# Patient Record
Sex: Female | Born: 1960 | Race: White | Hispanic: No | Smoking: Never smoker
Health system: Southern US, Community
[De-identification: ages and names within clinical notes are randomized; demographics above are authoritative.]

## PROBLEM LIST (undated history)

## (undated) DIAGNOSIS — Z780 Asymptomatic menopausal state: Secondary | ICD-10-CM

## (undated) DIAGNOSIS — R7303 Prediabetes: Secondary | ICD-10-CM

## (undated) DIAGNOSIS — E785 Hyperlipidemia, unspecified: Secondary | ICD-10-CM

## (undated) DIAGNOSIS — M722 Plantar fascial fibromatosis: Secondary | ICD-10-CM

## (undated) DIAGNOSIS — E039 Hypothyroidism, unspecified: Secondary | ICD-10-CM

## (undated) DIAGNOSIS — G8929 Other chronic pain: Secondary | ICD-10-CM

## (undated) DIAGNOSIS — F419 Anxiety disorder, unspecified: Secondary | ICD-10-CM

## (undated) DIAGNOSIS — E282 Polycystic ovarian syndrome: Secondary | ICD-10-CM

## (undated) DIAGNOSIS — M1990 Unspecified osteoarthritis, unspecified site: Secondary | ICD-10-CM

## (undated) DIAGNOSIS — N841 Polyp of cervix uteri: Secondary | ICD-10-CM

## (undated) DIAGNOSIS — F32A Depression, unspecified: Secondary | ICD-10-CM

## (undated) DIAGNOSIS — N809 Endometriosis, unspecified: Secondary | ICD-10-CM

## (undated) DIAGNOSIS — F329 Major depressive disorder, single episode, unspecified: Secondary | ICD-10-CM

## (undated) DIAGNOSIS — M549 Dorsalgia, unspecified: Secondary | ICD-10-CM

## (undated) DIAGNOSIS — J90 Pleural effusion, not elsewhere classified: Secondary | ICD-10-CM

## (undated) DIAGNOSIS — L709 Acne, unspecified: Secondary | ICD-10-CM

## (undated) DIAGNOSIS — K219 Gastro-esophageal reflux disease without esophagitis: Secondary | ICD-10-CM

## (undated) HISTORY — DX: Other chronic pain: G89.29

## (undated) HISTORY — DX: Gastro-esophageal reflux disease without esophagitis: K21.9

## (undated) HISTORY — PX: TONSILLECTOMY: SUR1361

## (undated) HISTORY — DX: Anxiety disorder, unspecified: F41.9

## (undated) HISTORY — PX: BREAST EXCISIONAL BIOPSY: SUR124

## (undated) HISTORY — DX: Hyperlipidemia, unspecified: E78.5

## (undated) HISTORY — DX: Major depressive disorder, single episode, unspecified: F32.9

## (undated) HISTORY — DX: Depression, unspecified: F32.A

## (undated) HISTORY — DX: Acne, unspecified: L70.9

## (undated) HISTORY — DX: Plantar fascial fibromatosis: M72.2

## (undated) HISTORY — PX: BREAST LUMPECTOMY: SHX2

## (undated) HISTORY — DX: Asymptomatic menopausal state: Z78.0

## (undated) HISTORY — PX: BREAST MASS EXCISION: SHX1267

## (undated) HISTORY — PX: DIAGNOSTIC LAPAROSCOPY: SUR761

## (undated) HISTORY — DX: Polycystic ovarian syndrome: E28.2

## (undated) HISTORY — DX: Endometriosis, unspecified: N80.9

## (undated) HISTORY — DX: Polyp of cervix uteri: N84.1

## (undated) HISTORY — DX: Hypothyroidism, unspecified: E03.9

## (undated) HISTORY — DX: Unspecified osteoarthritis, unspecified site: M19.90

## (undated) HISTORY — DX: Pleural effusion, not elsewhere classified: J90

## (undated) HISTORY — DX: Prediabetes: R73.03

## (undated) HISTORY — DX: Dorsalgia, unspecified: M54.9

---
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
## 1982-01-26 HISTORY — PX: OTHER SURGICAL HISTORY: SHX169

## 1997-11-19 ENCOUNTER — Ambulatory Visit (HOSPITAL_COMMUNITY): Admission: RE | Admit: 1997-11-19 | Discharge: 1997-11-19 | Payer: Self-pay | Admitting: *Deleted

## 2000-10-14 ENCOUNTER — Encounter: Admission: RE | Admit: 2000-10-14 | Discharge: 2000-10-14 | Payer: Self-pay | Admitting: Family Medicine

## 2001-10-26 ENCOUNTER — Encounter: Admission: RE | Admit: 2001-10-26 | Discharge: 2001-10-26 | Payer: Self-pay | Admitting: Family Medicine

## 2001-10-26 ENCOUNTER — Encounter: Payer: Self-pay | Admitting: Family Medicine

## 2001-11-02 ENCOUNTER — Other Ambulatory Visit: Admission: RE | Admit: 2001-11-02 | Discharge: 2001-11-02 | Payer: Self-pay | Admitting: Family Medicine

## 2001-11-21 ENCOUNTER — Encounter: Admission: RE | Admit: 2001-11-21 | Discharge: 2002-02-19 | Payer: Self-pay | Admitting: *Deleted

## 2002-03-23 ENCOUNTER — Encounter: Admission: RE | Admit: 2002-03-23 | Discharge: 2002-06-21 | Payer: Self-pay

## 2002-11-08 ENCOUNTER — Encounter: Admission: RE | Admit: 2002-11-08 | Discharge: 2002-11-08 | Payer: Self-pay

## 2003-11-15 ENCOUNTER — Encounter: Admission: RE | Admit: 2003-11-15 | Discharge: 2003-11-15 | Payer: Self-pay | Admitting: Family Medicine

## 2004-01-02 ENCOUNTER — Other Ambulatory Visit: Admission: RE | Admit: 2004-01-02 | Discharge: 2004-01-02 | Payer: Self-pay | Admitting: Family Medicine

## 2004-11-20 ENCOUNTER — Encounter: Admission: RE | Admit: 2004-11-20 | Discharge: 2004-11-20 | Payer: Self-pay | Admitting: Family Medicine

## 2005-01-16 ENCOUNTER — Other Ambulatory Visit: Admission: RE | Admit: 2005-01-16 | Discharge: 2005-01-16 | Payer: Self-pay | Admitting: Family Medicine

## 2005-12-10 ENCOUNTER — Encounter: Admission: RE | Admit: 2005-12-10 | Discharge: 2005-12-10 | Payer: Self-pay | Admitting: Internal Medicine

## 2006-02-24 ENCOUNTER — Other Ambulatory Visit: Admission: RE | Admit: 2006-02-24 | Discharge: 2006-02-24 | Payer: Self-pay | Admitting: Internal Medicine

## 2006-07-22 ENCOUNTER — Ambulatory Visit (HOSPITAL_COMMUNITY): Admission: RE | Admit: 2006-07-22 | Discharge: 2006-07-22 | Payer: Self-pay | Admitting: Obstetrics and Gynecology

## 2006-07-22 ENCOUNTER — Encounter (INDEPENDENT_AMBULATORY_CARE_PROVIDER_SITE_OTHER): Payer: Self-pay | Admitting: Obstetrics and Gynecology

## 2007-01-05 ENCOUNTER — Encounter: Admission: RE | Admit: 2007-01-05 | Discharge: 2007-01-05 | Payer: Self-pay | Admitting: Internal Medicine

## 2007-03-27 LAB — HM DEXA SCAN: HM Dexa Scan: NORMAL

## 2007-04-20 ENCOUNTER — Ambulatory Visit (HOSPITAL_COMMUNITY): Admission: RE | Admit: 2007-04-20 | Discharge: 2007-04-20 | Payer: Self-pay | Admitting: Internal Medicine

## 2008-01-16 ENCOUNTER — Encounter: Admission: RE | Admit: 2008-01-16 | Discharge: 2008-01-16 | Payer: Self-pay | Admitting: Internal Medicine

## 2008-01-27 HISTORY — PX: SYNOVIAL CYST EXCISION: SUR507

## 2008-01-30 ENCOUNTER — Ambulatory Visit (HOSPITAL_COMMUNITY): Admission: RE | Admit: 2008-01-30 | Discharge: 2008-01-30 | Payer: Self-pay | Admitting: Internal Medicine

## 2008-02-15 ENCOUNTER — Ambulatory Visit (HOSPITAL_COMMUNITY): Admission: RE | Admit: 2008-02-15 | Discharge: 2008-02-15 | Payer: Self-pay | Admitting: Orthopedic Surgery

## 2008-02-24 ENCOUNTER — Ambulatory Visit (HOSPITAL_COMMUNITY): Admission: RE | Admit: 2008-02-24 | Discharge: 2008-02-24 | Payer: Self-pay | Admitting: Orthopedic Surgery

## 2008-03-15 ENCOUNTER — Ambulatory Visit (HOSPITAL_COMMUNITY): Admission: RE | Admit: 2008-03-15 | Discharge: 2008-03-15 | Payer: Self-pay | Admitting: Orthopedic Surgery

## 2008-04-12 ENCOUNTER — Ambulatory Visit (HOSPITAL_COMMUNITY): Admission: RE | Admit: 2008-04-12 | Discharge: 2008-04-12 | Payer: Self-pay | Admitting: Orthopedic Surgery

## 2008-06-03 ENCOUNTER — Encounter: Admission: RE | Admit: 2008-06-03 | Discharge: 2008-06-03 | Payer: Self-pay | Admitting: Orthopedic Surgery

## 2008-08-28 ENCOUNTER — Ambulatory Visit (HOSPITAL_COMMUNITY): Admission: RE | Admit: 2008-08-28 | Discharge: 2008-08-29 | Payer: Self-pay | Admitting: Neurosurgery

## 2008-08-29 ENCOUNTER — Encounter (INDEPENDENT_AMBULATORY_CARE_PROVIDER_SITE_OTHER): Payer: Self-pay | Admitting: Neurosurgery

## 2009-02-06 ENCOUNTER — Encounter: Admission: RE | Admit: 2009-02-06 | Discharge: 2009-02-06 | Payer: Self-pay | Admitting: Internal Medicine

## 2010-01-26 DIAGNOSIS — Z860100 Personal history of colon polyps, unspecified: Secondary | ICD-10-CM

## 2010-01-26 HISTORY — DX: Personal history of colon polyps, unspecified: Z86.0100

## 2010-01-26 HISTORY — DX: Personal history of colonic polyps: Z86.010

## 2010-02-16 ENCOUNTER — Encounter: Payer: Self-pay | Admitting: Family Medicine

## 2010-03-05 ENCOUNTER — Other Ambulatory Visit: Payer: Self-pay | Admitting: Internal Medicine

## 2010-03-05 DIAGNOSIS — Z1231 Encounter for screening mammogram for malignant neoplasm of breast: Secondary | ICD-10-CM

## 2010-03-26 ENCOUNTER — Ambulatory Visit
Admission: RE | Admit: 2010-03-26 | Discharge: 2010-03-26 | Disposition: A | Discharge status: Home / Self Care | Payer: Commercial Managed Care - PPO | Source: Ambulatory Visit | Attending: Internal Medicine | Admitting: Internal Medicine

## 2010-03-26 DIAGNOSIS — Z1231 Encounter for screening mammogram for malignant neoplasm of breast: Secondary | ICD-10-CM

## 2010-05-04 LAB — COMPREHENSIVE METABOLIC PANEL
ALT: 34 U/L (ref 0–35)
Albumin: 4.5 g/dL (ref 3.5–5.2)
BUN: 6 mg/dL (ref 6–23)
CO2: 30 mEq/L (ref 19–32)
Chloride: 102 mEq/L (ref 96–112)
Creatinine, Ser: 0.81 mg/dL (ref 0.4–1.2)
GFR calc Af Amer: >60 mL/min (ref >60)
GFR calc non Af Amer: >60 mL/min (ref >60)
Glucose, Bld: 94 mg/dL (ref 70–99)
Potassium: 4.2 mEq/L (ref 3.5–5.1)
Sodium: 137 mEq/L (ref 135–145)
Total Protein: 6.9 g/dL (ref 6.0–8.3)

## 2010-05-04 LAB — DIFFERENTIAL
Lymphs Abs: 1.4 10*3/uL (ref 0.7–4.0)
Neutrophils Relative %: 57 % (ref 43–77)

## 2010-05-04 LAB — URINALYSIS, ROUTINE W REFLEX MICROSCOPIC
Glucose, UA: NEGATIVE mg/dL
Ketones, ur: NEGATIVE mg/dL
Specific Gravity, Urine: 1.004 — ABNORMAL LOW (ref 1.005–1.030)
pH: 6 (ref 5.0–8.0)

## 2010-05-04 LAB — PROTIME-INR: Prothrombin Time: 12.8 seconds (ref 11.6–15.2)

## 2010-05-04 LAB — APTT: aPTT: 25 seconds (ref 24–37)

## 2010-05-04 LAB — CBC
Platelets: 329 10*3/uL (ref 150–400)
RBC: 4.17 MIL/uL (ref 3.87–5.11)

## 2010-05-08 LAB — CBC
HCT: 36.8 % (ref 36.0–46.0)
Hemoglobin: 13 g/dL (ref 12.0–15.0)
MCHC: 35.2 g/dL (ref 30.0–36.0)
MCV: 92.5 fL (ref 78.0–100.0)
Platelets: 298 10*3/uL (ref 150–400)
RDW: 13.5 % (ref 11.5–15.5)

## 2010-05-08 LAB — PROTIME-INR
INR: 0.9 (ref 0.00–1.49)
Prothrombin Time: 12.4 seconds (ref 11.6–15.2)

## 2010-05-13 LAB — CBC
HCT: 35.5 % — ABNORMAL LOW (ref 36.0–46.0)
MCHC: 35.2 g/dL (ref 30.0–36.0)
MCV: 92.9 fL (ref 78.0–100.0)
Platelets: 266 10*3/uL (ref 150–400)
RBC: 3.82 MIL/uL — ABNORMAL LOW (ref 3.87–5.11)
RDW: 13.1 % (ref 11.5–15.5)
WBC: 4.3 10*3/uL (ref 4.0–10.5)

## 2010-05-13 LAB — WOUND CULTURE: Culture: NO GROWTH

## 2010-05-13 LAB — BASIC METABOLIC PANEL
Calcium: 9.3 mg/dL (ref 8.4–10.5)
GFR calc Af Amer: >60 mL/min (ref >60)

## 2010-05-13 LAB — PROTIME-INR
INR: 1 (ref 0.00–1.49)
Prothrombin Time: 12.9 seconds (ref 11.6–15.2)

## 2010-05-22 ENCOUNTER — Ambulatory Visit (INDEPENDENT_AMBULATORY_CARE_PROVIDER_SITE_OTHER): Payer: 59 | Admitting: Psychology

## 2010-05-22 DIAGNOSIS — F4322 Adjustment disorder with anxiety: Secondary | ICD-10-CM

## 2010-06-06 ENCOUNTER — Other Ambulatory Visit: Payer: Commercial Managed Care - PPO | Admitting: Gastroenterology

## 2010-06-10 NOTE — H&P (Signed)
NAMEGUSTAVO, MEDITZ          ACCOUNT NO.:  [id number]   MEDICAL RECORD NO.:  [id number]          PATIENT TYPE:  OIB   LOCATION:  3023                         FACILITY:  [facility]   PHYSICIAN:  Payton Doughty, M.D.      DATE OF BIRTH:  [birth year]   DATE OF ADMISSION:  08/28/2008  DATE OF DISCHARGE:                              HISTORY & PHYSICAL   ADMISSION DIAGNOSES:  Synovial cyst on the left side with left L5  radiculopathy.   This is a 50 year old right-handed white female who is having pain in  her back down her left leg for sometime, worse in December.  MR shows  synovial cyst was aspirated twice, and she had pain in her left leg.  She came to me with an MR from May 2010, it shows  she still has the  synovial cyst.   Medical history is remarkable for polycystic ovarian disease.   She is on Xanax, calcium, Keflex, Coenzyme Q, metformin, Vytorin,  TriCor, Prozac, Celebrex, and Synthroid.   She is allergic to ADVICOR.   SURGICAL HISTORY:  She had meningioma resected, tonsillectomy, and  laparoscopy for endometriosis.   SOCIAL HISTORY:  She does not smoke, is a social drinker.  She is a  Engineer, civil (consulting), works at [employer].   FAMILY HISTORY:  Mother is 26 and in good health with arthritis and  dementia.  Father deceased at 39, he had a stroke, heart attack, and  Parkinson disease.   REVIEW OF SYSTEMS:  Remarkable for wearing glasses,  hypercholesterolemia, leg pain, endometriosis, leg weakness, arthritis,  anxiety and depression, thyroid disease.   PHYSICAL EXAMINATION:  HEENT:  Normal limits.  NECK:  She has good range of motion of the neck.  CHEST:  Clear.  CARDIAC:  Regular rate and rhythm.  ABDOMEN:  Nontender with no hepatosplenomegaly.  EXTREMITIES:  Without clubbing or cyanosis.  GU:  Deferred.  Peripheral pulses are good.  NEUROLOGIC:  She is awake, alert, and oriented.  Cranial nerves are  intact.  MOTOR:  Shows 5/5 strength throughout the upper and lower  extremities.  In confrontational testing, she says when she rock-climbs, her left leg  feels weak.  Straight leg raise is mildly positive.  Reflexes are  intact.  Sensory dysesthesias is described in the left L5 distribution.   MR shows a 6-mm synovial cyst at the superior border of the L5-S1 facet  joint that appears to be engaging the L5 root that exits in that area.   CLINICAL IMPRESSION:  Synovial cyst of the left L5 with a L5-S1 facet  joint affecting the left L5 root.   PLAN:  Resection.  It is going to continue to bother L5 root/dorsal root  ganglion.  Approach to this would be through the pars reticularis on the  left side and probably I have to take off part of the superior facet of  L5-S1.  The risks and benefits have been discussed with her and she  wished to proceed.           ______________________________  Payton Doughty, M.D.     MWR/MEDQ  D:  08/28/2008  T:  08/28/2008  Job:  [id number]

## 2010-06-10 NOTE — Op Note (Signed)
[name], [name]          ACCOUNT NO.:  [id number]   MEDICAL RECORD NO.:  [id number]          PATIENT TYPE:  OIB   LOCATION:  3023                         FACILITY:  [facility]   PHYSICIAN:  Payton Doughty, M.D.      DATE OF BIRTH:  [birth year]   DATE OF PROCEDURE:  08/28/2008  DATE OF DISCHARGE:                               OPERATIVE REPORT   PREOPERATIVE DIAGNOSIS:  Synovial cyst in the foraminal position on the  left side at L5-S1.   POSTOPERATIVE DIAGNOSIS:  Synovial cyst in the foraminal position on the  left side at L5-S1.   PROCEDURE:  Interarticular resection of left synovial cyst.   SURGEON:  Payton Doughty, MD   ANESTHESIA:  General endotracheal.   PREPARATION:  Prepped and draped with alcohol wipe.   COMPLICATIONS:  None.   ASSISTANT:  Cristi Loron, MD   BODY OF TEXT:  This is a 50 year old girl with left L5 radiculopathy  related to a synovial cyst on the left side at L5-S1.  The patient was  taken to the operating room, smoothly anesthetized and intubated, placed  prone on the operating table.  Following shave, prep and drape in the  usual sterile fashion, skin was infiltrated with 1% lidocaine with  1:400,000 epinephrine and the left L5 lamina was exposed in the  subperiosteal plane.  Intraoperative x-ray confirmed correctness of  level.  The pars interarticularis and the superior portion of the 5-1  facet joint were exposed, and using a high-speed drill, this part of the  pars and part of the superior portion of the superior articular process  and the inferior portion of the superior articular process of 5 were  removed.  This allowed exposure of the foraminal region.  Immediately  obvious was a large synovial cyst that was grasped with the forceps and  removed.  The five root was observed underneath it, it was carefully  explored and found to be without compression at that time.  All  graspable synovium was removed.  Wound was irrigated.  Hemostasis  assured.  Depo-Medrol-soaked fat was placed in the bony defect.  Successive layers of 0 Vicryl, 2-0 Vicryl, and 3-0 nylon were used to  close.  Betadine and Telfa dressing was applied, made occlusive with  OpSite, and the patient returned to the recovery room in good condition.           ______________________________  Payton Doughty, M.D.     MWR/MEDQ  D:  08/28/2008  T:  08/28/2008  Job:  [id number]

## 2010-06-12 ENCOUNTER — Ambulatory Visit (INDEPENDENT_AMBULATORY_CARE_PROVIDER_SITE_OTHER): Payer: 59 | Admitting: Psychology

## 2010-06-12 DIAGNOSIS — F4322 Adjustment disorder with anxiety: Secondary | ICD-10-CM

## 2010-06-13 NOTE — H&P (Signed)
NAME:  Alison, Alvarez          ACCOUNT NO.:  [id number]   MEDICAL RECORD NO.:  [id number]          PATIENT TYPE:  AMB   LOCATION:  SDC                           FACILITY:  [facility]   PHYSICIAN:  Guy Sandifer. Henderson Cloud, M.D. DATE OF BIRTH:  March 27, 1960   DATE OF ADMISSION:  DATE OF DISCHARGE:                              HISTORY & PHYSICAL   CHIEF COMPLAINT:  Possible endometrial polyp.   HISTORY OF PRESENT ILLNESS:  This patient is a 50 year old married white  female, G0, P0 who presented with a cervical polyp which was removed in  the office on 03/24/06. Pathology was benign. Pap smear was also benign  on that day. Subsequently, the patient had ultrasound in the office on  04/28/06, revealing a uterus measuring 7.9 x 4.2 x 5.2 cm. There is a  possible 2.2-cm posterior cervical fibroid. Sonohystogram revealed an 11-  mm polypoid type structure within the endometrial cavity. After  discussing the options she is being admitted for hysteroscopy, D and C  with resectoscope. The potential risks and complications have been  discussed with the patient preoperatively.   PAST MEDICAL HISTORY:  1. Endometriosis.  2. History of depression, anxiety and panic attacks.  3. PCO.  4. Cystic acne.   PAST SURGICAL HISTORY:  1. Meningioma, age 53.  2. Tonsillectomy.  3. Wisdom tooth removal.  4. Laparoscopy x2.  5. Benign lumpectomy of the left breast.   OBSTETRIC HISTORY:  Negative.   FAMILY HISTORY:  1. Diabetes.  2. Cancer.  3. Stroke.   MEDICATIONS:  1. Thyroid replacement.  2. Prozac.  3. Metformin.  4. Vytorin.  5. Tricor.   ALLERGIES:  1. ADVICOR.  2. NIACIN, LEADING TO SWELLING.   SOCIAL HISTORY:  Denies tobacco, alcohol or drug abuse.   REVIEW OF SYSTEMS:  NEUROLOGICAL: Denies headache. CARDIAC: Denies chest  pain. PULMONARY: Denies shortness of breath. GI: Denies recent changes  in bowel habits.   PHYSICAL EXAMINATION:  VITAL SIGNS: Height 5 feet 3 3/4 inches, weight  149.5  pounds, blood pressure 100/60.  LUNGS: Clear to auscultation.  HEART: Regular rate and rhythm.  BREASTS: Not examined.  ABDOMEN: Soft, nontender without masses.  PELVIC: Exam shows vulva, vagina without lesions. There is a Nabothian  cyst of the cervix. The uterus is normal size, mobile and tender. Adnexa  nontender without masses.   ASSESSMENT:  Probable endometrial polyp.   PLAN:  Hysteroscopy with resectoscope, dilatation and curettage.      Guy Sandifer Henderson Cloud, M.D.  Electronically Signed     JET/MEDQ  D:  07/21/2006  T:  07/21/2006  Job:  [id number]

## 2010-06-13 NOTE — Op Note (Signed)
NAME:  Alison Alvarez, Alison Alvarez          ACCOUNT NO.:  [id number]   MEDICAL RECORD NO.:  [id number]          PATIENT TYPE:  AMB   LOCATION:  SDC                           FACILITY:  [facility]   PHYSICIAN:  Guy Sandifer. Henderson Cloud, M.D. DATE OF BIRTH:  10-05-1960   DATE OF PROCEDURE:  07/22/2006  DATE OF DISCHARGE:                               OPERATIVE REPORT   PREOPERATIVE DIAGNOSIS:  Probable endometrial polyp.   POSTOPERATIVE DIAGNOSIS:  Endometrial polyp.   PROCEDURE:  Hysteroscopy with resection of endometrial polyp, dilatation  and curettage, cervical biopsy and 1% Xylocaine paracervical block.   SURGEON:  Guy Sandifer. Henderson Cloud, M.D.   ANESTHESIA:  MAC.   SPECIMENS:  1. Endometrial polyp.  2. Endometrial curettings.  3. Cervical biopsy.  All to pathology.   ESTIMATED BLOOD LOSS:  Minimal.   INDICATIONS AND CONSENT:  The patient is a 50 year old, married, white  female, G0, P0 with probable endometrial polyp.  Details are dictated in  the history and physical.  Hysteroscopy D&C has been discussed with the  patient preoperatively.  Potential risks and complications have been  discussed including but limited to infection, uterine perforation, organ  damage, bleeding requiring transfusion of blood products with possible  transfusion reaction, HIV and hepatitis acquisition, laparoscopy,  laparotomy, hysterectomy, DVT, PE, pneumonia.  All questions were  answered, and consent is signed on the chart.   FINDINGS:  There is an approximately 1-cm polypoid-type process on the  lip of the external cervical os.  There is a 2-cm broad-based polypoid  process in the right upper endometrial cavity.  Both fallopian tube  ostia are identified.   PROCEDURE:  The patient is taken to operating room where she is  identified, placed in dorsal supine position and MAC anesthesia is  administered.  She is then placed in dorsal lithotomy position where she  is prepped, bladder is straight catheterized, and draped  in a sterile  fashion.  Bivalve speculum is placed, and the anterior cervical lip is  injected with 1% Xylocaine and grasped with single-tooth tenaculum.  Paracervical block is then placed at 2, 4, 5, 7, 8 and 10 o'clock  positions with approximately 20 mL of 1% plain Xylocaine.  Cervix is  gently progressively dilated.  Diagnostic hysteroscope is placed in the  endocervical canal and advanced under direct visualization using  sorbitol distending media.  The above findings noted.  Diagnostic  hysteroscope is withdrawn.  Cervix is further dilated, and the  resectoscope is then placed with a single right-angle wire loop.  It  again is advanced under direct visualization.  The polyp is removed in a  simple fashion without difficulty.  Sharp curettage is carried out.  Good hemostasis is  noted.  The polypoid process on the cervix is resected in a simple  fashion with Mayo scissors and sent to pathology.  The structure largely  collapses during resection.  Good hemostasis is noted all around.  All  counts correct.  The patient is awakened, taken to recovery room in  stable condition.      Guy Sandifer Henderson Cloud, M.D.  Electronically Signed     JET/MEDQ  D:  07/22/2006  T:  07/22/2006  Job:  [id number]

## 2010-06-19 ENCOUNTER — Other Ambulatory Visit: Payer: Commercial Managed Care - PPO | Admitting: Gastroenterology

## 2010-07-10 ENCOUNTER — Ambulatory Visit (INDEPENDENT_AMBULATORY_CARE_PROVIDER_SITE_OTHER): Payer: 59 | Admitting: Psychology

## 2010-07-10 DIAGNOSIS — F4322 Adjustment disorder with anxiety: Secondary | ICD-10-CM

## 2010-07-31 ENCOUNTER — Ambulatory Visit (INDEPENDENT_AMBULATORY_CARE_PROVIDER_SITE_OTHER): Payer: 59 | Admitting: Psychology

## 2010-07-31 DIAGNOSIS — F4322 Adjustment disorder with anxiety: Secondary | ICD-10-CM

## 2010-08-07 ENCOUNTER — Ambulatory Visit (AMBULATORY_SURGERY_CENTER): Payer: 59 | Admitting: *Deleted

## 2010-08-07 VITALS — Ht 62.0 in | Wt 158.7 lb

## 2010-08-07 DIAGNOSIS — Z1211 Encounter for screening for malignant neoplasm of colon: Secondary | ICD-10-CM

## 2010-08-07 MED ORDER — PEG-KCL-NACL-NASULF-NA ASC-C 100 G PO SOLR: ORAL | Status: DC

## 2010-08-21 ENCOUNTER — Ambulatory Visit (AMBULATORY_SURGERY_CENTER): Payer: 59 | Admitting: Gastroenterology

## 2010-08-21 ENCOUNTER — Encounter: Payer: Self-pay | Admitting: Gastroenterology

## 2010-08-21 VITALS — BP 130/74 | HR 60 | Temp 98.4°F | Resp 15 | Ht 62.0 in | Wt 158.0 lb

## 2010-08-21 DIAGNOSIS — K620 Anal polyp: Secondary | ICD-10-CM

## 2010-08-21 DIAGNOSIS — D129 Benign neoplasm of anus and anal canal: Secondary | ICD-10-CM

## 2010-08-21 DIAGNOSIS — D126 Benign neoplasm of colon, unspecified: Secondary | ICD-10-CM

## 2010-08-21 DIAGNOSIS — Z1211 Encounter for screening for malignant neoplasm of colon: Secondary | ICD-10-CM

## 2010-08-21 DIAGNOSIS — D128 Benign neoplasm of rectum: Secondary | ICD-10-CM

## 2010-08-21 LAB — HM COLONOSCOPY

## 2010-08-21 MED ORDER — SODIUM CHLORIDE 0.9 % IV SOLN: 500.0000 mL | INTRAVENOUS | Status: DC

## 2010-08-21 NOTE — Patient Instructions (Signed)
HANDOUTS GIVEN ON POLYPS, ONCE WE RECEIVE PATHOLOGY RESULTS WE WILL MAIL YOU A LETTER WITH DR Anselm Jungling RECOMMENDATIONS- MAY REPEAT COLON IN 5-10 YEARS  DISCHARGE INSTRUCTIONS GIVEN PER BLUE AND GREEN SHEETS

## 2010-08-22 ENCOUNTER — Telehealth: Payer: Self-pay

## 2010-08-22 NOTE — Telephone Encounter (Signed)
Follow up Call- Patient questions:  Do you have a fever, pain , or abdominal swelling? no Pain Score  0 *  Have you tolerated food without any problems? yes  Have you been able to return to your normal activities? yes  Do you have any questions about your discharge instructions: Diet   no Medications  no Follow up visit  no  Do you have questions or concerns about your Care? no  Actions: * If pain score is 4 or above: No action needed, pain <4.  Per the pt, "I'm fine."  maw

## 2010-08-27 ENCOUNTER — Encounter: Payer: Self-pay | Admitting: Gastroenterology

## 2010-08-28 ENCOUNTER — Ambulatory Visit (INDEPENDENT_AMBULATORY_CARE_PROVIDER_SITE_OTHER): Payer: 59 | Admitting: Psychology

## 2010-08-28 DIAGNOSIS — F4322 Adjustment disorder with anxiety: Secondary | ICD-10-CM

## 2010-09-18 ENCOUNTER — Ambulatory Visit (INDEPENDENT_AMBULATORY_CARE_PROVIDER_SITE_OTHER): Payer: 59 | Admitting: Psychology

## 2010-09-18 DIAGNOSIS — F4322 Adjustment disorder with anxiety: Secondary | ICD-10-CM

## 2010-10-09 ENCOUNTER — Ambulatory Visit (INDEPENDENT_AMBULATORY_CARE_PROVIDER_SITE_OTHER): Payer: 59 | Admitting: Psychology

## 2010-10-09 DIAGNOSIS — F4322 Adjustment disorder with anxiety: Secondary | ICD-10-CM

## 2010-10-23 ENCOUNTER — Ambulatory Visit (INDEPENDENT_AMBULATORY_CARE_PROVIDER_SITE_OTHER): Payer: 59 | Admitting: Psychology

## 2010-10-23 DIAGNOSIS — F4322 Adjustment disorder with anxiety: Secondary | ICD-10-CM

## 2010-10-30 ENCOUNTER — Other Ambulatory Visit (HOSPITAL_COMMUNITY): Payer: Self-pay | Admitting: Internal Medicine

## 2010-10-30 DIAGNOSIS — R7989 Other specified abnormal findings of blood chemistry: Secondary | ICD-10-CM

## 2010-11-12 LAB — COMPREHENSIVE METABOLIC PANEL
ALT: 24
AST: 25
CO2: 33 — ABNORMAL HIGH
Calcium: 9.5
Creatinine, Ser: 0.76
GFR calc Af Amer: >60
GFR calc non Af Amer: >60
Sodium: 141
Total Protein: 6.7

## 2010-11-12 LAB — CBC
MCHC: 34.2
MCV: 90.1
Platelets: 358
RBC: 4.03
RDW: 13.3

## 2010-11-20 ENCOUNTER — Ambulatory Visit (INDEPENDENT_AMBULATORY_CARE_PROVIDER_SITE_OTHER): Payer: 59 | Admitting: Psychology

## 2010-11-20 ENCOUNTER — Ambulatory Visit (HOSPITAL_COMMUNITY)
Admission: RE | Admit: 2010-11-20 | Discharge: 2010-11-20 | Disposition: A | Discharge status: Home / Self Care | Payer: 59 | Source: Ambulatory Visit | Attending: Internal Medicine | Admitting: Internal Medicine

## 2010-11-20 DIAGNOSIS — R7989 Other specified abnormal findings of blood chemistry: Secondary | ICD-10-CM | POA: Insufficient documentation

## 2010-11-20 DIAGNOSIS — K7689 Other specified diseases of liver: Secondary | ICD-10-CM | POA: Insufficient documentation

## 2010-11-20 DIAGNOSIS — F4322 Adjustment disorder with anxiety: Secondary | ICD-10-CM

## 2010-12-04 ENCOUNTER — Ambulatory Visit (INDEPENDENT_AMBULATORY_CARE_PROVIDER_SITE_OTHER): Payer: 59 | Admitting: Psychology

## 2010-12-04 DIAGNOSIS — F4322 Adjustment disorder with anxiety: Secondary | ICD-10-CM

## 2011-01-01 ENCOUNTER — Ambulatory Visit (INDEPENDENT_AMBULATORY_CARE_PROVIDER_SITE_OTHER): Payer: 59 | Admitting: Psychology

## 2011-01-01 DIAGNOSIS — F4322 Adjustment disorder with anxiety: Secondary | ICD-10-CM

## 2011-02-05 ENCOUNTER — Ambulatory Visit: Payer: 59 | Admitting: Psychology

## 2011-03-04 ENCOUNTER — Other Ambulatory Visit: Payer: Self-pay | Admitting: Family Medicine

## 2011-03-04 DIAGNOSIS — Z1231 Encounter for screening mammogram for malignant neoplasm of breast: Secondary | ICD-10-CM

## 2011-04-16 ENCOUNTER — Ambulatory Visit
Admission: RE | Admit: 2011-04-16 | Discharge: 2011-04-16 | Disposition: A | Discharge status: Home / Self Care | Payer: 59 | Source: Ambulatory Visit | Attending: Family Medicine | Admitting: Family Medicine

## 2011-04-16 ENCOUNTER — Other Ambulatory Visit: Payer: Self-pay | Admitting: Internal Medicine

## 2011-04-16 ENCOUNTER — Ambulatory Visit: Payer: 59

## 2011-04-16 DIAGNOSIS — Z1231 Encounter for screening mammogram for malignant neoplasm of breast: Secondary | ICD-10-CM

## 2012-04-05 ENCOUNTER — Other Ambulatory Visit: Payer: Self-pay

## 2012-04-05 DIAGNOSIS — Z1231 Encounter for screening mammogram for malignant neoplasm of breast: Secondary | ICD-10-CM

## 2012-05-19 ENCOUNTER — Ambulatory Visit
Admission: RE | Admit: 2012-05-19 | Discharge: 2012-05-19 | Disposition: A | Discharge status: Home / Self Care | Payer: 59 | Source: Ambulatory Visit

## 2012-05-19 DIAGNOSIS — Z1231 Encounter for screening mammogram for malignant neoplasm of breast: Secondary | ICD-10-CM

## 2012-05-19 LAB — HM MAMMOGRAPHY: HM Mammogram: NORMAL

## 2012-11-29 ENCOUNTER — Telehealth: Payer: Self-pay | Admitting: *Deleted

## 2012-11-30 NOTE — Telephone Encounter (Signed)
Mailed last CPE labs from 06/2012 to pt per pt request.

## 2012-11-30 NOTE — Telephone Encounter (Signed)
Please send cpe copies AD.

## 2012-12-19 ENCOUNTER — Other Ambulatory Visit: Payer: Self-pay | Admitting: Emergency Medicine

## 2012-12-19 NOTE — Telephone Encounter (Signed)
Called pt at [phone] per [facility] request to make an appt. Left pt a message to call me-janie to sch this.

## 2013-01-11 ENCOUNTER — Encounter: Payer: Self-pay | Admitting: Internal Medicine

## 2013-01-12 ENCOUNTER — Ambulatory Visit (INDEPENDENT_AMBULATORY_CARE_PROVIDER_SITE_OTHER): Payer: 59 | Admitting: Emergency Medicine

## 2013-01-12 ENCOUNTER — Encounter: Payer: Self-pay | Admitting: Emergency Medicine

## 2013-01-12 VITALS — BP 106/70 | HR 64 | Temp 98.0°F | Resp 18 | Wt 150.0 lb

## 2013-01-12 DIAGNOSIS — E785 Hyperlipidemia, unspecified: Secondary | ICD-10-CM

## 2013-01-12 DIAGNOSIS — R7303 Prediabetes: Secondary | ICD-10-CM

## 2013-01-12 DIAGNOSIS — L709 Acne, unspecified: Secondary | ICD-10-CM | POA: Insufficient documentation

## 2013-01-12 DIAGNOSIS — R7309 Other abnormal glucose: Secondary | ICD-10-CM

## 2013-01-12 DIAGNOSIS — E079 Disorder of thyroid, unspecified: Secondary | ICD-10-CM

## 2013-01-12 DIAGNOSIS — E559 Vitamin D deficiency, unspecified: Secondary | ICD-10-CM

## 2013-01-12 DIAGNOSIS — Z87898 Personal history of other specified conditions: Secondary | ICD-10-CM | POA: Insufficient documentation

## 2013-01-12 DIAGNOSIS — G8929 Other chronic pain: Secondary | ICD-10-CM

## 2013-01-12 DIAGNOSIS — F411 Generalized anxiety disorder: Secondary | ICD-10-CM

## 2013-01-12 DIAGNOSIS — F419 Anxiety disorder, unspecified: Secondary | ICD-10-CM | POA: Insufficient documentation

## 2013-01-12 LAB — CBC WITH DIFFERENTIAL/PLATELET
Basophils Absolute: 0 10*3/uL (ref 0.0–0.1)
Basophils Relative: 0 % (ref 0–1)
Eosinophils Absolute: 0.1 10*3/uL (ref 0.0–0.7)
Eosinophils Relative: 2 % (ref 0–5)
HCT: 38.2 % (ref 36.0–46.0)
Hemoglobin: 13.2 g/dL (ref 12.0–15.0)
Lymphocytes Relative: 38 % (ref 12–46)
Lymphs Abs: 1.1 10*3/uL (ref 0.7–4.0)
MCH: 31.1 pg (ref 26.0–34.0)
MCHC: 34.6 g/dL (ref 30.0–36.0)
MCV: 89.9 fL (ref 78.0–100.0)
Monocytes Absolute: 0.3 10*3/uL (ref 0.1–1.0)
Monocytes Relative: 11 % (ref 3–12)
Neutro Abs: 1.5 10*3/uL — ABNORMAL LOW (ref 1.7–7.7)
Neutrophils Relative %: 49 % (ref 43–77)
Platelets: 318 10*3/uL (ref 150–400)
RBC: 4.25 MIL/uL (ref 3.87–5.11)
RDW: 13.7 % (ref 11.5–15.5)
WBC: 3 10*3/uL — ABNORMAL LOW (ref 4.0–10.5)

## 2013-01-12 LAB — BASIC METABOLIC PANEL WITH GFR
BUN: 11 mg/dL (ref 6–23)
CO2: 28 mEq/L (ref 19–32)
Calcium: 10.3 mg/dL (ref 8.4–10.5)
Chloride: 105 mEq/L (ref 96–112)
Creat: 0.84 mg/dL (ref 0.50–1.10)
GFR, Est African American: >89 mL/min
GFR, Est Non African American: 80 mL/min
Glucose, Bld: 88 mg/dL (ref 70–99)
Potassium: 4.9 mEq/L (ref 3.5–5.3)
Sodium: 143 mEq/L (ref 135–145)

## 2013-01-12 LAB — HEPATIC FUNCTION PANEL
ALT: 28 U/L (ref 0–35)
AST: 32 U/L (ref 0–37)
Albumin: 4.7 g/dL (ref 3.5–5.2)
Alkaline Phosphatase: 37 U/L — ABNORMAL LOW (ref 39–117)
Bilirubin, Direct: 0.1 mg/dL (ref 0.0–0.3)
Indirect Bilirubin: 0.3 mg/dL (ref 0.0–0.9)
Total Bilirubin: 0.4 mg/dL (ref 0.3–1.2)
Total Protein: 6.8 g/dL (ref 6.0–8.3)

## 2013-01-12 LAB — LIPID PANEL
Cholesterol: 161 mg/dL (ref 0–200)
HDL: 68 mg/dL (ref >39)
LDL Cholesterol: 83 mg/dL (ref 0–99)
Total CHOL/HDL Ratio: 2.4 Ratio
Triglycerides: 50 mg/dL (ref <150)
VLDL: 10 mg/dL (ref 0–40)

## 2013-01-12 LAB — HEMOGLOBIN A1C
Hgb A1c MFr Bld: 5.8 % — ABNORMAL HIGH (ref <5.7)
Mean Plasma Glucose: 120 mg/dL — ABNORMAL HIGH (ref <117)

## 2013-01-12 LAB — TSH: TSH: 9.691 u[IU]/mL — ABNORMAL HIGH (ref 0.350–4.500)

## 2013-01-12 MED ORDER — ALPRAZOLAM 0.25 MG PO TABS: 0.2500 mg | ORAL_TABLET | Freq: Three times a day (TID) | ORAL | Status: DC

## 2013-01-12 NOTE — Patient Instructions (Signed)

## 2013-01-15 DIAGNOSIS — E559 Vitamin D deficiency, unspecified: Secondary | ICD-10-CM | POA: Insufficient documentation

## 2013-01-15 NOTE — Progress Notes (Addendum)
Subjective:    Patient ID: Alison Alvarez, female    DOB: 07-20-1960, 52 y.o.   MRN: [id number]  HPI Comments: 52 yo female presents for 3 month F/U for Cholesterol, Pre-Dm, D. Deficient, hypothyroid. She is overall doing well. She is exercising regularly. She eats healthy. She is trying to slowly lose wt and build muscle. LAST LABS t 179 tg 13 h 72 l 86  She notes only occasional anxiety. She notes she feels better with regular activity. She only occasionally takes Xanax.  Anxiety       Current Outpatient Prescriptions on File Prior to Visit  Medication Sig Dispense Refill  . Bioflavonoid Products (PERIDIN-C PO) Take 200 mg by mouth. Peridin c 2 tabs three times a day for hot flashes       . cephALEXin (KEFLEX) 500 MG capsule Take 500 mg by mouth 2 (two) times daily.        . Coenzyme Q10 (CO Q 10 PO) Take 200 mg by mouth daily.        Marland Kitchen ezetimibe-simvastatin (VYTORIN) 10-40 MG per tablet Take 1 tablet by mouth at bedtime.        . fenofibrate micronized (LOFIBRA) 134 MG capsule TAKE 1 CAPSULE BY MOUTH ONCE DAILY  90 capsule  0  . FLUoxetine (PROZAC) 20 MG capsule TAKE 1 CAPSULE BY MOUTH TWICE DAILY  180 capsule  0  . ibuprofen (ADVIL) 200 MG tablet Take 200 mg by mouth every 6 (six) hours as needed.        Marland Kitchen levothyroxine (SYNTHROID, LEVOTHROID) 75 MCG tablet Take 75 mcg by mouth. Takes 75 mcg Monday, Wednesday, Friday       . loratadine (CLARITIN) 10 MG tablet Take 10 mg by mouth as needed.        . Multiple Vitamin (MULTIVITAMIN) tablet Take 1 tablet by mouth daily.        Marland Kitchen omeprazole (PRILOSEC) 20 MG capsule Take 20 mg by mouth daily.        . Vitamin D, Ergocalciferol, (DRISDOL) 50000 UNITS CAPS capsule TAKE AS DIRECTED  24 capsule  0   Current Facility-Administered Medications on File Prior to Visit  Medication Dose Route Frequency Provider Last Rate Last Dose  . 0.9 %  sodium chloride infusion  500 mL Intravenous Continuous Meryl Dare, MD        ALLERGIES Advicor and Niacin-lovastatin er  Past Medical History  Diagnosis Date  . PCO (polycystic ovaries)   . Anxiety   . Arthritis   . Depression   . GERD (gastroesophageal reflux disease)   . Hyperlipidemia   . Hypothyroidism, adult     hypothyroidism  . Pre-diabetes   . Chronic back pain   . Post-menopausal   . Plantar fasciitis   . Acne   . Cervical polyp     Review of Systems  All other systems reviewed and are negative.   BP 106/70  Pulse 64  Temp(Src) 98 F (36.7 C) (Temporal)  Resp 18  Wt 150 lb (68.04 kg)     Objective:   Physical Exam  Nursing note and vitals reviewed. Constitutional: She is oriented to person, place, and time. She appears well-developed and well-nourished. No distress.  HENT:  Head: Normocephalic and atraumatic.  Right Ear: External ear normal.  Left Ear: External ear normal.  Nose: Nose normal.  Mouth/Throat: Oropharynx is clear and moist.  Eyes: Conjunctivae and EOM are normal.  Neck: Normal range of motion. Neck supple. No JVD  present. No thyromegaly present.  Cardiovascular: Normal rate, regular rhythm, normal heart sounds and intact distal pulses.   Pulmonary/Chest: Effort normal and breath sounds normal.  Abdominal: Soft. Bowel sounds are normal. She exhibits no distension and no mass. There is no tenderness. There is no rebound and no guarding.  Musculoskeletal: Normal range of motion. She exhibits no edema and no tenderness.  Lymphadenopathy:    She has no cervical adenopathy.  Neurological: She is alert and oriented to person, place, and time. No cranial nerve deficit.  Skin: Skin is warm and dry. No rash noted. No erythema. No pallor.  Psychiatric: She has a normal mood and affect. Her behavior is normal. Judgment and thought content normal.          Assessment & Plan:  1.  3 month F/U for Cholesterol, Pre-Dm, D. Deficient, Hypothyroid. Needs healthy diet, cardio QD and obtain healthy weight. Check Labs, Check  BP if >130/80 call office 2. Anxiety- Continue RX same if sx increase W/c

## 2013-01-24 ENCOUNTER — Ambulatory Visit (INDEPENDENT_AMBULATORY_CARE_PROVIDER_SITE_OTHER): Payer: 59 | Admitting: Physician Assistant

## 2013-01-24 ENCOUNTER — Encounter: Payer: Self-pay | Admitting: Physician Assistant

## 2013-01-24 VITALS — BP 112/62 | HR 72 | Temp 97.5°F | Resp 16 | Ht 61.0 in | Wt 150.0 lb

## 2013-01-24 DIAGNOSIS — J019 Acute sinusitis, unspecified: Secondary | ICD-10-CM

## 2013-01-24 MED ORDER — AZITHROMYCIN 250 MG PO TABS: ORAL_TABLET | ORAL | Status: DC

## 2013-01-24 MED ORDER — PREDNISONE 20 MG PO TABS: ORAL_TABLET | ORAL | Status: DC

## 2013-01-24 NOTE — Patient Instructions (Signed)

## 2013-01-24 NOTE — Progress Notes (Signed)
   Subjective:    Patient ID: Alison Alvarez, female    DOB: [birth year], 52 y.o.   MRN: [id number]  Cough This is a new problem. The current episode started in the past 7 days. The problem has been gradually worsening. The cough is productive of purulent sputum. Associated symptoms include chills, ear congestion, ear pain, a fever, myalgias, nasal congestion, postnasal drip and a sore throat. Pertinent negatives include no chest pain, headaches, heartburn, hemoptysis, rash, rhinorrhea, shortness of breath, sweats, weight loss or wheezing. Treatments tried: mucinex. The treatment provided mild relief.   Review of Systems  Constitutional: Positive for fever and chills. Negative for weight loss.  HENT: Positive for ear pain, postnasal drip and sore throat. Negative for rhinorrhea.   Eyes: Negative.   Respiratory: Positive for cough. Negative for hemoptysis, shortness of breath and wheezing.   Cardiovascular: Negative for chest pain.  Gastrointestinal: Negative.  Negative for heartburn.  Genitourinary: Negative.   Musculoskeletal: Positive for myalgias.  Skin: Negative for rash.  Neurological: Negative for headaches.       Objective:   Physical Exam  Constitutional: She appears well-developed and well-nourished.  HENT:  Head: Normocephalic and atraumatic.  Right Ear: External ear normal.  Nose: Right sinus exhibits frontal sinus tenderness. Left sinus exhibits frontal sinus tenderness.  Eyes: Conjunctivae and EOM are normal.  Neck: Normal range of motion. Neck supple.  Cardiovascular: Normal rate, regular rhythm, normal heart sounds and intact distal pulses.   Pulmonary/Chest: Effort normal and breath sounds normal. No respiratory distress. She has no wheezes.  Abdominal: Soft. Bowel sounds are normal.  Lymphadenopathy:    She has no cervical adenopathy.  Skin: Skin is warm and dry.      Assessment & Plan:  Acute sinusitis, unspecified - Plan: azithromycin (ZITHROMAX) 250 MG  tablet, predniSONE (DELTASONE) 20 MG tablet

## 2013-01-25 ENCOUNTER — Ambulatory Visit: Payer: Self-pay | Admitting: Physician Assistant

## 2013-02-01 ENCOUNTER — Telehealth: Payer: Self-pay

## 2013-02-01 NOTE — Telephone Encounter (Signed)
lmom to pt about scheduling 1 mo lab only visit.

## 2013-02-01 NOTE — Telephone Encounter (Signed)
Message copied by Nadyne Coombes on Wed Feb 01, 2013  1:10 PM ------      Message from: Kelby Aline R      Created: Mon Jan 16, 2013  5:49 PM       WBC 2.7 is now 3 so mild improvement but Thyroid out of range increase dose to 1.5 pills Tu TH SA SU and 1 pill MWF.  A1c elevated continue to improve diet and exercise and wt loss. COntinue rest the same. Needs recheck 1 month Lab Only if not in range at that point needs u/s. ------

## 2013-02-07 ENCOUNTER — Encounter: Payer: Self-pay | Admitting: Physician Assistant

## 2013-02-07 ENCOUNTER — Ambulatory Visit (INDEPENDENT_AMBULATORY_CARE_PROVIDER_SITE_OTHER): Payer: 59 | Admitting: Physician Assistant

## 2013-02-07 VITALS — BP 120/70 | HR 76 | Temp 97.9°F | Resp 16 | Wt 150.0 lb

## 2013-02-07 DIAGNOSIS — J209 Acute bronchitis, unspecified: Secondary | ICD-10-CM

## 2013-02-07 MED ORDER — LEVOFLOXACIN 500 MG PO TABS: 500.0000 mg | ORAL_TABLET | Freq: Every day | ORAL | Status: DC

## 2013-02-07 MED ORDER — ALBUTEROL SULFATE HFA 108 (90 BASE) MCG/ACT IN AERS: 2.0000 | INHALATION_SPRAY | Freq: Four times a day (QID) | RESPIRATORY_TRACT | Status: DC | PRN

## 2013-02-07 MED ORDER — IPRATROPIUM-ALBUTEROL 0.5-2.5 (3) MG/3ML IN SOLN
3.0000 mL | Freq: Once | RESPIRATORY_TRACT | Status: AC
  Administered 2013-02-07: 3 mL via RESPIRATORY_TRACT

## 2013-02-07 NOTE — Patient Instructions (Addendum)
Albuterol use as needed every 4-6 hours Sample inhaler use twice a day and wash your mouth afterwards.   The majority of colds are caused by viruses and do not require antibiotics. Please read the rest of this hand out to learn more about the common cold and what you can do to help yourself as well as help prevent the over use of antibiotics.   COMMON COLD SIGNS AND SYMPTOMS - The common cold usually causes nasal congestion, runny nose, and sneezing. A sore throat may be present on the first day but usually resolves quickly. If a cough occurs, it generally develops on about the fourth or fifth day of symptoms, typically when congestion and runny nose are resolving  COMMON COLD COMPLICATIONS - In most cases, colds do not cause serious illness or complications. Most colds last for three to seven days, although many people continue to have symptoms (coughing, sneezing, congestion) for up to two weeks.  One of the more common complications is sinusitis, which is usually caused by viruses and rarely (about 2 percent of the time) by bacteria. Having thick or yellow to green-colored nasal discharge does not mean that bacterial sinusitis has developed; discolored nasal discharge is a normal phase of the common cold.  Lower respiratory infections, such as pneumonia or bronchitis, may develop following a cold.  Infection of the middle ear, or otitis media, can accompany or follow a cold.  COMMON COLD TREATMENT - There is no specific treatment for the viruses that cause the common cold. Most treatments are aimed at relieving some of the symptoms of the cold, but do not shorten or cure the cold. Antibiotics are not useful for treating the common cold; antibiotics are only used to treat illnesses caused by bacteria, not viruses. Unnecessary use of antibiotics for the treatment of the common cold can cause allergic reactions, diarrhea, or other gastrointestinal symptoms in some patients.  The symptoms of a cold will  resolve over time, even without any treatment. People with underlying medical conditions and those who use other over-the-counter or prescription medications should speak with their healthcare provider or pharmacist to ensure that it is safe to use these treatments. The following are treatments that may reduce the symptoms caused by the common cold.  Nasal congestion - Decongestants are good for nasal congestion- if you feel very stuffy but no mucus is coming out, this is the medication that will help you the most.  Pseudoephedrine is a decongestant that can improve nasal congestion. Although a prescription is not required, drugstores in the [place] keep pseudoephedrine behind the counter, so it must be requested from a pharmacist. If you have a heart condition or high blood pressure please use Coricidin BPH instead.   Runny nose - Antihistamines such as diphenhydramine (Benadryl), certazine (Zyrtec) which are best taking at night because they can make you tired OR loratadine (Claritin),  fexafinadine (Allegra) help with a runny nose.   Nasal sprays such an oxymetazoline (Afrin and others) may also give temporary relief of nasal congestion. However, these sprays should never be used for more than two to three days; use for more than three days use can worsen congestion.  Nasocort is now over the counter and can help decrease a runny nose. Please stop the medication if you have blurry vision or nose bleeds.   Sore throat and headache - Sore throat and headache are best treated with a mild pain reliever such as acetaminophen (Tylenol) or a non-steroidal anti-inflammatory agent such as  ibuprofen or naproxen (Motrin or Aleve). These medications should be taken with food to prevent stomach problems. As well as gargling with warm water and salt.   Cough - Common cough medicine ingredients include guaifenesin and dextromethorphan; these are often combined with other medications in over-the-counter cold  formulas. Often a cough is worse at night or first in the morning due to post nasal drip from you nose. You can try to sleep at an angle to decrease a cough.   Alternative treatments - Heated, humidified air can improve symptoms of nasal congestion and runny nose, and causes few to no side effects. A number of alternative products, including vitamin C, doubling up on your vitamin D and herbal products such as echinacea, may help. Certain products, such as nasal gels that contain zinc (eg, Zicam), have been associated with a permanent loss of smell.  Antibiotics - Antibiotics should not be used to treat an uncomplicated common cold. As noted above, colds are caused by viruses. Antibiotics treat bacterial, not viral infections. Some viruses that cause the common cold can also depress the immune system or cause swelling in the lining of the nose or airways; this can, in turn, lead to a bacterial infection. Often you need to give your body 7 days to fight off a common cold while treating the symptoms with the medications listed above. If after 7 days your symptoms are not improving, you are getting worse, you have shortness of breath, chest pain, a fever of over 103 you should seek medical help immediately.   PREVENTION IS THE BEST MEDICINE - Hand washing is an essential and highly effective way to prevent the spread of infection.  Alcohol-based hand rubs are a good alternative for disinfecting hands if a sink is not available.  Hands should be washed before preparing food and eating and after coughing, blowing the nose, or sneezing. While it is not always possible to limit contact with people who may be infected with a cold, touching the eyes, nose, or mouth after direct contact should be avoided when possible. Sneezing/coughing into the sleeve of one's clothing (at the inner elbow) is another means of containing sprays of saliva and secretions and does not contaminate the hands.        

## 2013-02-07 NOTE — Progress Notes (Signed)
   Subjective:    Patient ID: Alison Alvarez, female    DOB: April 20, 1960, 53 y.o.   MRN: [id number]  Cough This is a new problem. The problem has been unchanged. The cough is productive of purulent sputum. Associated symptoms include ear congestion, nasal congestion, postnasal drip and wheezing. Pertinent negatives include no chest pain, chills, ear pain, fever, headaches, heartburn, hemoptysis, myalgias, rash, rhinorrhea, sore throat, shortness of breath, sweats or weight loss. The symptoms are aggravated by lying down. She has tried nothing for the symptoms.   Patient was seen 01/24/13 for sinusitis given zpak/prednisone and felt better. Then on Jan 9th she started to have a cough again and is here today feeling worse.    Review of Systems  Constitutional: Negative for fever, chills, weight loss and fatigue.  HENT: Positive for postnasal drip. Negative for ear pain, rhinorrhea and sore throat.   Eyes: Negative.   Respiratory: Positive for cough and wheezing. Negative for hemoptysis and shortness of breath.   Cardiovascular: Negative for chest pain.  Gastrointestinal: Negative.  Negative for heartburn.  Genitourinary: Negative.   Musculoskeletal: Negative.  Negative for myalgias.  Skin: Negative for rash.  Neurological: Negative.  Negative for headaches.      Objective:   Physical Exam  Constitutional: She is oriented to person, place, and time. She appears well-developed and well-nourished.  HENT:  Head: Normocephalic and atraumatic.  Right Ear: External ear normal.  Left Ear: External ear normal.  Nose: Nose normal.  Mouth/Throat: Oropharynx is clear and moist.  Eyes: Conjunctivae are normal. Pupils are equal, round, and reactive to light.  Neck: Normal range of motion. Neck supple.  Cardiovascular: Normal rate and regular rhythm.   Pulmonary/Chest: Effort normal. No respiratory distress. She has wheezes. She has no rales. She exhibits no tenderness.  Abdominal: Soft. Bowel  sounds are normal.  Lymphadenopathy:    She has no cervical adenopathy.  Neurological: She is alert and oriented to person, place, and time.  Skin: Skin is warm and dry.      Assessment & Plan:  Acute bronchitis - Plan: levofloxacin (LEVAQUIN) 500 MG tablet, ipratropium-albuterol (DUONEB) 0.5-2.5 (3) MG/3ML nebulizer solution 3 mL, albuterol (PROVENTIL HFA;VENTOLIN HFA) 108 (90 BASE) MCG/ACT inhaler Patient given breathing treatment in the office and feels better.  Albuterol inhaler sent to pharm, sample of aerosol given.

## 2013-02-20 NOTE — Progress Notes (Signed)
[facility] TO CALL TO SCHEDULE LAB ONLY 02/20/13

## 2013-02-23 ENCOUNTER — Other Ambulatory Visit: Payer: 59

## 2013-02-23 ENCOUNTER — Other Ambulatory Visit: Payer: Self-pay | Admitting: Physician Assistant

## 2013-02-23 ENCOUNTER — Other Ambulatory Visit: Payer: Self-pay | Admitting: Emergency Medicine

## 2013-02-23 DIAGNOSIS — E039 Hypothyroidism, unspecified: Secondary | ICD-10-CM

## 2013-02-23 LAB — TSH: TSH: 0.252 u[IU]/mL — AB (ref 0.350–4.500)

## 2013-03-16 ENCOUNTER — Other Ambulatory Visit: Payer: Self-pay | Admitting: Emergency Medicine

## 2013-03-16 ENCOUNTER — Other Ambulatory Visit: Payer: Self-pay | Admitting: Physician Assistant

## 2013-03-24 ENCOUNTER — Other Ambulatory Visit: Payer: Self-pay | Admitting: Physician Assistant

## 2013-03-27 ENCOUNTER — Other Ambulatory Visit: Payer: Self-pay | Admitting: Emergency Medicine

## 2013-03-27 ENCOUNTER — Other Ambulatory Visit: Payer: 59

## 2013-03-27 DIAGNOSIS — E039 Hypothyroidism, unspecified: Secondary | ICD-10-CM

## 2013-03-28 ENCOUNTER — Other Ambulatory Visit: Payer: Self-pay | Admitting: Emergency Medicine

## 2013-03-28 ENCOUNTER — Encounter: Payer: Self-pay | Admitting: Emergency Medicine

## 2013-03-28 LAB — TSH: TSH: 1.442 u[IU]/mL (ref 0.350–4.500)

## 2013-05-08 ENCOUNTER — Other Ambulatory Visit: Payer: Self-pay | Admitting: Emergency Medicine

## 2013-05-08 ENCOUNTER — Other Ambulatory Visit: Payer: Self-pay

## 2013-05-08 ENCOUNTER — Telehealth: Payer: Self-pay | Admitting: *Deleted

## 2013-05-08 DIAGNOSIS — Z1231 Encounter for screening mammogram for malignant neoplasm of breast: Secondary | ICD-10-CM

## 2013-05-08 DIAGNOSIS — Z780 Asymptomatic menopausal state: Secondary | ICD-10-CM

## 2013-05-08 MED ORDER — SCOPOLAMINE 1 MG/3DAYS TD PT72: 1.0000 | MEDICATED_PATCH | TRANSDERMAL | Status: DC

## 2013-05-08 NOTE — Telephone Encounter (Signed)
Please find out where her mammo is scheduled at and we can send order for bone density. I will also send in patches

## 2013-05-08 NOTE — Telephone Encounter (Signed)
PT has appt already scheduled for 05/25/13 for a MGM asking can we send a order for her to have a BMD the same Day?   Pt is going on a cruise and is asking for Sea sick patches? If so call to [facility] out pt Pharm

## 2013-05-09 ENCOUNTER — Other Ambulatory Visit: Payer: Self-pay | Admitting: Emergency Medicine

## 2013-05-16 ENCOUNTER — Other Ambulatory Visit: Payer: Self-pay | Admitting: Emergency Medicine

## 2013-05-16 DIAGNOSIS — F419 Anxiety disorder, unspecified: Secondary | ICD-10-CM

## 2013-05-16 MED ORDER — ALPRAZOLAM 0.25 MG PO TABS: 0.2500 mg | ORAL_TABLET | Freq: Three times a day (TID) | ORAL | Status: DC

## 2013-05-16 NOTE — Telephone Encounter (Signed)
Patient requesting refill on Xanax 0.25 mg 1po TID #90 for upcoming trip.  States last filled 12/2012 doesn't use often.  Wants called into [facility].  Also [facility] order was already in [place] for both mammo and Dexa scan.

## 2013-05-25 ENCOUNTER — Ambulatory Visit (HOSPITAL_COMMUNITY): Payer: 59

## 2013-05-25 ENCOUNTER — Ambulatory Visit: Payer: 59

## 2013-06-28 ENCOUNTER — Ambulatory Visit
Admission: RE | Admit: 2013-06-28 | Discharge: 2013-06-28 | Disposition: A | Discharge status: Home / Self Care | Payer: 59 | Source: Ambulatory Visit

## 2013-06-28 ENCOUNTER — Ambulatory Visit (HOSPITAL_COMMUNITY)
Admission: RE | Admit: 2013-06-28 | Discharge: 2013-06-28 | Disposition: A | Discharge status: Home / Self Care | Payer: 59 | Source: Ambulatory Visit | Attending: Emergency Medicine | Admitting: Emergency Medicine

## 2013-06-28 DIAGNOSIS — Z1231 Encounter for screening mammogram for malignant neoplasm of breast: Secondary | ICD-10-CM

## 2013-06-28 DIAGNOSIS — Z780 Asymptomatic menopausal state: Secondary | ICD-10-CM | POA: Insufficient documentation

## 2013-06-28 DIAGNOSIS — Z13820 Encounter for screening for osteoporosis: Secondary | ICD-10-CM | POA: Insufficient documentation

## 2013-07-26 ENCOUNTER — Ambulatory Visit (INDEPENDENT_AMBULATORY_CARE_PROVIDER_SITE_OTHER): Payer: 59 | Admitting: Emergency Medicine

## 2013-07-26 ENCOUNTER — Encounter: Payer: Self-pay | Admitting: Emergency Medicine

## 2013-07-26 VITALS — BP 104/60 | HR 60 | Temp 97.5°F | Resp 16 | Ht 61.75 in | Wt 148.0 lb

## 2013-07-26 DIAGNOSIS — Z1212 Encounter for screening for malignant neoplasm of rectum: Secondary | ICD-10-CM

## 2013-07-26 DIAGNOSIS — Z0000 Encounter for general adult medical examination without abnormal findings: Secondary | ICD-10-CM

## 2013-07-26 DIAGNOSIS — Z23 Encounter for immunization: Secondary | ICD-10-CM

## 2013-07-26 LAB — CBC WITH DIFFERENTIAL/PLATELET
BASOS PCT: 1 % (ref 0–1)
Basophils Absolute: 0 10*3/uL (ref 0.0–0.1)
EOS ABS: 0.1 10*3/uL (ref 0.0–0.7)
Eosinophils Relative: 4 % (ref 0–5)
HCT: 37.2 % (ref 36.0–46.0)
Hemoglobin: 12.7 g/dL (ref 12.0–15.0)
Lymphocytes Relative: 41 % (ref 12–46)
Lymphs Abs: 1.1 10*3/uL (ref 0.7–4.0)
MCH: 31.2 pg (ref 26.0–34.0)
MCHC: 34.1 g/dL (ref 30.0–36.0)
MCV: 91.4 fL (ref 78.0–100.0)
MONOS PCT: 10 % (ref 3–12)
Monocytes Absolute: 0.3 10*3/uL (ref 0.1–1.0)
Neutro Abs: 1.2 10*3/uL — ABNORMAL LOW (ref 1.7–7.7)
Neutrophils Relative %: 44 % (ref 43–77)
Platelets: 319 10*3/uL (ref 150–400)
RBC: 4.07 MIL/uL (ref 3.87–5.11)
RDW: 13.4 % (ref 11.5–15.5)
WBC: 2.8 10*3/uL — ABNORMAL LOW (ref 4.0–10.5)

## 2013-07-26 LAB — HEMOGLOBIN A1C
HEMOGLOBIN A1C: 5.9 % — AB (ref <5.7)
Mean Plasma Glucose: 123 mg/dL — ABNORMAL HIGH (ref <117)

## 2013-07-26 MED ORDER — EZETIMIBE-SIMVASTATIN 10-40 MG PO TABS: 1.0000 | ORAL_TABLET | Freq: Every day | ORAL | Status: DC

## 2013-07-26 NOTE — Patient Instructions (Signed)
Tetanus, Diphtheria, Pertussis (Tdap) Vaccine  What You Need to Know  WHY GET VACCINATED?  Tetanus, diphtheria and pertussis can be very serious diseases, even for adolescents and adults. Tdap vaccine can protect us from these diseases.  TETANUS (Lockjaw) causes painful muscle tightening and stiffness, usually all over the body.  · It can lead to tightening of muscles in the head and neck so you can't open your mouth, swallow, or sometimes even breathe. Tetanus kills about 1 out of 5 people who are infected.  DIPHTHERIA can cause a thick coating to form in the back of the throat.  · It can lead to breathing problems, paralysis, heart failure, and death.  PERTUSSIS (Whooping Cough) causes severe coughing spells, which can cause difficulty breathing, vomiting and disturbed sleep.  · It can also lead to weight loss, incontinence, and rib fractures. Up to 2 in 100 adolescents and 5 in 100 adults with pertussis are hospitalized or have complications, which could include pneumonia and death.  These diseases are caused by bacteria. Diphtheria and pertussis are spread from person to person through coughing or sneezing. Tetanus enters the body through cuts, scratches, or wounds.  Before vaccines, the [place] saw as many as 200,000 cases a year of diphtheria and pertussis, and hundreds of cases of tetanus. Since vaccination began, tetanus and diphtheria have dropped by about 99% and pertussis by about 80%.  TDAP VACCINE  Tdap vaccine can protect adolescents and adults from tetanus, diphtheria, and pertussis. One dose of Tdap is routinely given at age 11 or 12. People who did not get Tdap at that age should get it as soon as possible.  Tdap is especially important for health care professionals and anyone having close contact with a baby younger than 12 months.  Pregnant women should get a dose of Tdap during every pregnancy, to protect the newborn from pertussis. Infants are most at risk for severe, life-threatening  complications from pertussis.  A similar vaccine, called Td, protects from tetanus and diphtheria, but not pertussis. A Td booster should be given every 10 years. Tdap may be given as one of these boosters if you have not already gotten a dose. Tdap may also be given after a severe cut or burn to prevent tetanus infection.  Your doctor can give you more information.  Tdap may safely be given at the same time as other vaccines.  SOME PEOPLE SHOULD NOT GET THIS VACCINE  · If you ever had a life-threatening allergic reaction after a dose of any tetanus, diphtheria, or pertussis containing vaccine, OR if you have a severe allergy to any part of this vaccine, you should not get Tdap. Tell your doctor if you have any severe allergies.  · If you had a coma, or long or multiple seizures within 7 days after a childhood dose of DTP or DTaP, you should not get Tdap, unless a cause other than the vaccine was found. You can still get Td.  · Talk to your doctor if you:  ¨ have epilepsy or another nervous system problem,  ¨ had severe pain or swelling after any vaccine containing diphtheria, tetanus or pertussis,  ¨ ever had Guillain-Barré Syndrome (GBS),  ¨ aren't feeling well on the day the shot is scheduled.  RISKS OF A VACCINE REACTION  With any medicine, including vaccines, there is a chance of side effects. These are usually mild and go away on their own, but serious reactions are also possible.  Brief fainting spells can follow   a vaccination, leading to injuries from falling. Sitting or lying down for about 15 minutes can help prevent these. Tell your doctor if you feel dizzy or light-headed, or have vision changes or ringing in the ears.  Mild problems following Tdap (Did not interfere with activities)  · Pain where the shot was given (about 3 in 4 adolescents or 2 in 3 adults)  · Redness or swelling where the shot was given (about 1 person in 5)  · Mild fever of at least 100.4°F (up to about 1 in 25 adolescents or 1 in  100 adults)  · Headache (about 3 or 4 people in 10)  · Tiredness (about 1 person in 3 or 4)  · Nausea, vomiting, diarrhea, stomach ache (up to 1 in 4 adolescents or 1 in 10 adults)  · Chills, body aches, sore joints, rash, swollen glands (uncommon)  Moderate problems following Tdap (Interfered with activities, but did not require medical attention)  · Pain where the shot was given (about 1 in 5 adolescents or 1 in 100 adults)  · Redness or swelling where the shot was given (up to about 1 in 16 adolescents or 1 in 25 adults)  · Fever over 102°F (about 1 in 100 adolescents or 1 in 250 adults)  · Headache (about 3 in 20 adolescents or 1 in 10 adults)  · Nausea, vomiting, diarrhea, stomach ache (up to 1 or 3 people in 100)  · Swelling of the entire arm where the shot was given (up to about 3 in 100).  Severe problems following Tdap (Unable to perform usual activities, required medical attention)  · Swelling, severe pain, bleeding and redness in the arm where the shot was given (rare).  A severe allergic reaction could occur after any vaccine (estimated less than 1 in a million doses).  WHAT IF THERE IS A SERIOUS REACTION?  What should I look for?  · Look for anything that concerns you, such as signs of a severe allergic reaction, very high fever, or behavior changes.  Signs of a severe allergic reaction can include hives, swelling of the face and throat, difficulty breathing, a fast heartbeat, dizziness, and weakness. These would start a few minutes to a few hours after the vaccination.  What should I do?  · If you think it is a severe allergic reaction or other emergency that can't wait, call 9-1-1 or get the person to the nearest hospital. Otherwise, call your doctor.  · Afterward, the reaction should be reported to the "Vaccine Adverse Event Reporting System" (VAERS). Your doctor might file this report, or you can do it yourself through the VAERS web site at www.vaers.hhs.gov, or by calling 1-800-822-7967.  VAERS is  only for reporting reactions. They do not give medical advice.   THE NATIONAL VACCINE INJURY COMPENSATION PROGRAM  The National Vaccine Injury Compensation Program (VICP) is a federal program that was created to compensate people who may have been injured by certain vaccines.  Persons who believe they may have been injured by a vaccine can learn about the program and about filing a claim by calling 1-800-338-2382 or visiting the VICP website at www.hrsa.gov/vaccinecompensation.  HOW CAN I LEARN MORE?  · Ask your doctor.  · Call your local or state health department.  · Contact the Centers for Disease Control and Prevention (CDC):  ¨ Call 1-800-232-4636 or visit CDC's website at www.cdc.gov/vaccines.  CDC Tdap Vaccine VIS (06/04/11)  Document Released: 07/14/2011 Document Revised: 05/09/2012 Document Reviewed: 05/04/2012  ExitCare®   Patient Information ©2015 [employer]. This information is not intended to replace advice given to you by your health care provider. Make sure you discuss any questions you have with your health care provider.

## 2013-07-26 NOTE — Progress Notes (Signed)
Subjective:    Patient ID: Alison Alvarez, female    DOB: 07-05-1960, 53 y.o.   MRN: [id number]  HPI Comments: 53 yo WF CPE and cholesterol/ A1c elevation f/u. She has been doing well overall. She is exercising routinely which has helped her back. She is eating with weight watchers and trying to lose weight.   She notes chronic constipation despite eating healthier. She notes reflux has been controlled with omeprazole.  WBC             3.0   01/12/2013 HGB            13.2   01/12/2013 HCT            38.2   01/12/2013 PLT             318   01/12/2013 GLUCOSE          88   01/12/2013 CHOL            161   01/12/2013 TRIG             50   01/12/2013 HDL              68   01/12/2013 LDLCALC          83   01/12/2013 ALT              28   01/12/2013 AST              32   01/12/2013 NA              143   01/12/2013 K               4.9   01/12/2013 CL              105   01/12/2013 CREATININE     0.84   01/12/2013 BUN              11   01/12/2013 CO2              28   01/12/2013 TSH           1.442   03/27/2013 INR             1.0   08/22/2008 HGBA1C          5.8   01/12/2013     Medication List       This list is accurate as of: 07/26/13 11:59 PM.  Always use your most recent med list.               ADVIL 200 MG tablet  Generic drug:  ibuprofen  Take 200 mg by mouth every 6 (six) hours as needed.     albuterol 108 (90 BASE) MCG/ACT inhaler  Commonly known as:  PROVENTIL HFA;VENTOLIN HFA  Inhale 2 puffs into the lungs every 6 (six) hours as needed for wheezing or shortness of breath.     ALPRAZolam 0.25 MG tablet  Commonly known as:  XANAX  Take 1 tablet (0.25 mg total) by mouth 3 (three) times daily.     cephALEXin 500 MG capsule  Commonly known as:  KEFLEX  TAKE 1 CAPSULE BY MOUTH TWICE A DAY     CO Q 10 PO  Take 200 mg by mouth daily.     ezetimibe-simvastatin 10-40 MG per tablet  Commonly known as:  VYTORIN  Take 1 tablet by mouth at bedtime.     fenofibrate micronized  134 MG capsule  Commonly known as:  LOFIBRA  TAKE 1 CAPSULE BY MOUTH ONCE DAILY (NEED OFFICE VISIT FOR REFILLS)     FLUoxetine 20 MG capsule  Commonly known as:  PROZAC  TAKE 1 CAPSULE BY MOUTH TWICE DAILY (NEED OFFICE VISIT FOR REFILLS)     HYDROcodone-acetaminophen 5-325 MG per tablet  Commonly known as:  NORCO/VICODIN  Take 1 tablet by mouth every 6 (six) hours as needed for moderate pain.     levothyroxine 75 MCG tablet  Commonly known as:  SYNTHROID, LEVOTHROID  Take 75 mcg by mouth.     loratadine 10 MG tablet  Commonly known as:  CLARITIN  Take 10 mg by mouth as needed.     Magnesium 250 MG Tabs  Take 250 mg by mouth daily.     multivitamin tablet  Take 1 tablet by mouth daily.     omeprazole 20 MG capsule  Commonly known as:  PRILOSEC  Take 20 mg by mouth daily.     PERIDIN-C PO  Take 200 mg by mouth. Peridin c 2 tabs three times a day for hot flashes     Vitamin D (Ergocalciferol) 50000 UNITS Caps capsule  Commonly known as:  DRISDOL  TAKE AS DIRECTED (NEEDS OFFICE VISIT IN NEXT 1-2 MONTHS FOR MORE REFILLS)       Allergies  Allergen Reactions  . Advicor [Niacin-Lovastatin Er] Swelling    Facial swelling  . Niacin-Lovastatin Er Swelling and Other (See Comments)    Extreme flushing   Past Medical History  Diagnosis Date  . PCO (polycystic ovaries)   . Anxiety   . Arthritis   . Depression   . GERD (gastroesophageal reflux disease)   . Hyperlipidemia   . Hypothyroidism, adult     hypothyroidism  . Pre-diabetes   . Chronic back pain   . Post-menopausal   . Plantar fasciitis   . Acne   . Cervical polyp    Past Surgical History  Procedure Laterality Date  . Synovial cyst excision  2010    l5, s1 left hip  . Brain meningioma excision  1965    at age 43  . Tonsillectomy    . Wisom teeth  1984  . Diagnostic laparoscopy    . Breast mass excision      left breast, benign   History  Substance Use Topics  . Smoking status:  Never Smoker   . Smokeless tobacco: Not on file  . Alcohol Use: Yes     Comment: occasional alcohol intake   Family History  Problem Relation Age of Onset  . Dementia Mother   . Osteoporosis Mother   . Heart disease Father   . Parkinson's disease Father   . Stroke Father   . Diabetes Father   . Hyperlipidemia Father   . Hypertension Father   . Cancer Paternal Aunt     breast  . Cancer Maternal Grandfather     brain   MAINTENANCE: Colonoscopy: Mammo: 06/28/13 WNL breast cener BMD: 06/28/13 Osteopenia [facility] Pap/ Pelvic: 10/2013 EYE: fall 2014 WNL Dentist:q 6 month due 10/15  IMMUNIZATIONS: Tdap: 2005 Pneumovax: Zostavax: Influenza:2014  Patient Care Team: Unk Pinto, MD as PCP - General (Internal Medicine) Ladene Artist, MD as Consulting Physician (Gastroenterology) Allena Katz, MD as Consulting Physician (Obstetrics and Gynecology) Emelia Loron, MD as Referring Physician (Neurosurgery)   Review of Systems  Gastrointestinal: Positive for constipation.  All other systems reviewed and are negative.  BP 104/60  Pulse 60  Temp(Src) 97.5 F (36.4 C)  Resp 16  Ht 5' 1.75" (1.568 m)  Wt 148 lb (67.132 kg)  BMI 27.30 kg/m2     Objective:   Physical Exam  Nursing note and vitals reviewed. Constitutional: She is oriented to person, place, and time. She appears well-developed and well-nourished. No distress.  HENT:  Head: Normocephalic and atraumatic.  Right Ear: External ear normal.  Left Ear: External ear normal.  Nose: Nose normal.  Mouth/Throat: Oropharynx is clear and moist.  Eyes: Conjunctivae and EOM are normal. Pupils are equal, round, and reactive to light. Right eye exhibits no discharge. Left eye exhibits no discharge. No scleral icterus.  Neck: Normal range of motion. Neck supple. No JVD present. No tracheal deviation present. No thyromegaly present.  Cardiovascular: Normal rate, regular rhythm, normal heart sounds and intact distal  pulses.   Pulmonary/Chest: Effort normal and breath sounds normal.  Abdominal: Soft. Bowel sounds are normal. She exhibits no distension and no mass. There is no tenderness. There is no rebound and no guarding.  Genitourinary:  Def gyn  Musculoskeletal: Normal range of motion. She exhibits no edema and no tenderness.  Lymphadenopathy:    She has no cervical adenopathy.  Neurological: She is alert and oriented to person, place, and time. She has normal reflexes. No cranial nerve deficit. She exhibits normal muscle tone. Coordination normal.  Skin: Skin is warm and dry. No rash noted. No erythema. No pallor.  Cystic acne scars on face  Psychiatric: She has a normal mood and affect. Her behavior is normal. Judgment and thought content normal.   EKG NSCSPT WNL     Assessment & Plan:  1. CPE- Update screening labs/ History/ Immunizations/ Testing as needed. Advised healthy diet, QD exercise, increase H20 and continue RX/ Vitamins AD.   2. Constipation- Increase fiber/ water intake, decrease caffeine, increase activity level, can add Miralax or Metamucil QD until BM soft

## 2013-07-27 LAB — URINALYSIS, ROUTINE W REFLEX MICROSCOPIC
BILIRUBIN URINE: NEGATIVE
Glucose, UA: NEGATIVE mg/dL
Hgb urine dipstick: NEGATIVE
Ketones, ur: NEGATIVE mg/dL
LEUKOCYTES UA: NEGATIVE
Nitrite: NEGATIVE
Protein, ur: NEGATIVE mg/dL
SPECIFIC GRAVITY, URINE: 1.005 (ref 1.005–1.030)
UROBILINOGEN UA: 0.2 mg/dL (ref 0.0–1.0)
pH: 7 (ref 5.0–8.0)

## 2013-07-27 LAB — HEPATIC FUNCTION PANEL
ALT: 23 U/L (ref 0–35)
AST: 27 U/L (ref 0–37)
Albumin: 4.8 g/dL (ref 3.5–5.2)
Alkaline Phosphatase: 40 U/L (ref 39–117)
BILIRUBIN DIRECT: 0.1 mg/dL (ref 0.0–0.3)
BILIRUBIN INDIRECT: 0.2 mg/dL (ref 0.2–1.2)
BILIRUBIN TOTAL: 0.3 mg/dL (ref 0.2–1.2)
Total Protein: 6.6 g/dL (ref 6.0–8.3)

## 2013-07-27 LAB — LIPID PANEL
CHOL/HDL RATIO: 2.4 ratio
Cholesterol: 173 mg/dL (ref 0–200)
HDL: 73 mg/dL (ref >39)
LDL Cholesterol: 89 mg/dL (ref 0–99)
Triglycerides: 56 mg/dL (ref <150)
VLDL: 11 mg/dL (ref 0–40)

## 2013-07-27 LAB — BASIC METABOLIC PANEL WITH GFR
BUN: 8 mg/dL (ref 6–23)
CALCIUM: 9.7 mg/dL (ref 8.4–10.5)
CO2: 27 mEq/L (ref 19–32)
CREATININE: 0.77 mg/dL (ref 0.50–1.10)
Chloride: 102 mEq/L (ref 96–112)
GFR, Est Non African American: 88 mL/min
GLUCOSE: 85 mg/dL (ref 70–99)
Potassium: 4.6 mEq/L (ref 3.5–5.3)
SODIUM: 142 meq/L (ref 135–145)

## 2013-07-27 LAB — MICROALBUMIN / CREATININE URINE RATIO
Creatinine, Urine: 45.1 mg/dL
MICROALB UR: 0.5 mg/dL (ref 0.00–1.89)
Microalb Creat Ratio: 11.1 mg/g (ref 0.0–30.0)

## 2013-07-27 LAB — MAGNESIUM: Magnesium: 2.1 mg/dL (ref 1.5–2.5)

## 2013-07-27 LAB — INSULIN, FASTING: Insulin fasting, serum: 18 u[IU]/mL (ref 3–28)

## 2013-07-27 LAB — TSH: TSH: 1.058 u[IU]/mL (ref 0.350–4.500)

## 2013-07-27 LAB — VITAMIN D 25 HYDROXY (VIT D DEFICIENCY, FRACTURES): Vit D, 25-Hydroxy: 85 ng/mL (ref 30–89)

## 2013-08-01 ENCOUNTER — Encounter: Payer: Self-pay | Admitting: Emergency Medicine

## 2013-08-10 ENCOUNTER — Other Ambulatory Visit (INDEPENDENT_AMBULATORY_CARE_PROVIDER_SITE_OTHER): Payer: 59

## 2013-08-10 DIAGNOSIS — Z1212 Encounter for screening for malignant neoplasm of rectum: Secondary | ICD-10-CM

## 2013-08-10 DIAGNOSIS — Z0000 Encounter for general adult medical examination without abnormal findings: Secondary | ICD-10-CM

## 2013-08-10 LAB — POC HEMOCCULT BLD/STL (HOME/3-CARD/SCREEN)
Card #2 Fecal Occult Blod, POC: NEGATIVE
FECAL OCCULT BLD: NEGATIVE
Fecal Occult Blood, POC: NEGATIVE

## 2013-08-17 ENCOUNTER — Other Ambulatory Visit: Payer: Self-pay | Admitting: Emergency Medicine

## 2013-09-21 ENCOUNTER — Other Ambulatory Visit: Payer: 59

## 2013-09-21 DIAGNOSIS — E782 Mixed hyperlipidemia: Secondary | ICD-10-CM

## 2013-09-21 DIAGNOSIS — R6889 Other general symptoms and signs: Secondary | ICD-10-CM

## 2013-09-21 DIAGNOSIS — Z79899 Other long term (current) drug therapy: Secondary | ICD-10-CM

## 2013-09-21 LAB — LIPID PANEL
CHOL/HDL RATIO: 3.2 ratio
Cholesterol: 207 mg/dL — ABNORMAL HIGH (ref 0–200)
HDL: 65 mg/dL (ref >39)
LDL Cholesterol: 117 mg/dL — ABNORMAL HIGH (ref 0–99)
TRIGLYCERIDES: 123 mg/dL (ref <150)
VLDL: 25 mg/dL (ref 0–40)

## 2013-09-21 LAB — CBC WITH DIFFERENTIAL/PLATELET
BASOS PCT: 1 % (ref 0–1)
Basophils Absolute: 0 10*3/uL (ref 0.0–0.1)
EOS ABS: 0.1 10*3/uL (ref 0.0–0.7)
EOS PCT: 2 % (ref 0–5)
HEMATOCRIT: 35.2 % — AB (ref 36.0–46.0)
Hemoglobin: 12 g/dL (ref 12.0–15.0)
LYMPHS PCT: 36 % (ref 12–46)
Lymphs Abs: 1.3 10*3/uL (ref 0.7–4.0)
MCH: 31.5 pg (ref 26.0–34.0)
MCHC: 34.1 g/dL (ref 30.0–36.0)
MCV: 92.4 fL (ref 78.0–100.0)
MONO ABS: 0.3 10*3/uL (ref 0.1–1.0)
Monocytes Relative: 9 % (ref 3–12)
Neutro Abs: 1.9 10*3/uL (ref 1.7–7.7)
Neutrophils Relative %: 52 % (ref 43–77)
PLATELETS: 323 10*3/uL (ref 150–400)
RBC: 3.81 MIL/uL — ABNORMAL LOW (ref 3.87–5.11)
RDW: 13.2 % (ref 11.5–15.5)
WBC: 3.6 10*3/uL — ABNORMAL LOW (ref 4.0–10.5)

## 2013-09-23 ENCOUNTER — Encounter: Payer: Self-pay | Admitting: Physician Assistant

## 2013-10-11 ENCOUNTER — Other Ambulatory Visit: Payer: Self-pay | Admitting: Internal Medicine

## 2013-10-11 ENCOUNTER — Other Ambulatory Visit: Payer: Self-pay | Admitting: Emergency Medicine

## 2013-10-12 ENCOUNTER — Other Ambulatory Visit: Payer: Self-pay | Admitting: Obstetrics and Gynecology

## 2013-10-13 LAB — CYTOLOGY - PAP

## 2013-12-05 ENCOUNTER — Encounter: Payer: Self-pay | Admitting: Physician Assistant

## 2013-12-05 ENCOUNTER — Ambulatory Visit (INDEPENDENT_AMBULATORY_CARE_PROVIDER_SITE_OTHER): Payer: 59 | Admitting: Physician Assistant

## 2013-12-05 VITALS — BP 100/62 | HR 60 | Temp 97.7°F | Resp 16 | Ht 61.75 in | Wt 147.0 lb

## 2013-12-05 DIAGNOSIS — E785 Hyperlipidemia, unspecified: Secondary | ICD-10-CM

## 2013-12-05 DIAGNOSIS — R7303 Prediabetes: Secondary | ICD-10-CM

## 2013-12-05 DIAGNOSIS — E559 Vitamin D deficiency, unspecified: Secondary | ICD-10-CM

## 2013-12-05 DIAGNOSIS — R7309 Other abnormal glucose: Secondary | ICD-10-CM

## 2013-12-05 DIAGNOSIS — F419 Anxiety disorder, unspecified: Secondary | ICD-10-CM

## 2013-12-05 DIAGNOSIS — E079 Disorder of thyroid, unspecified: Secondary | ICD-10-CM

## 2013-12-05 DIAGNOSIS — K5900 Constipation, unspecified: Secondary | ICD-10-CM

## 2013-12-05 LAB — CBC WITH DIFFERENTIAL/PLATELET
BASOS ABS: 0 10*3/uL (ref 0.0–0.1)
BASOS PCT: 1 % (ref 0–1)
Eosinophils Absolute: 0.1 10*3/uL (ref 0.0–0.7)
Eosinophils Relative: 2 % (ref 0–5)
HCT: 37.8 % (ref 36.0–46.0)
Hemoglobin: 13 g/dL (ref 12.0–15.0)
LYMPHS ABS: 1.2 10*3/uL (ref 0.7–4.0)
Lymphocytes Relative: 40 % (ref 12–46)
MCH: 31 pg (ref 26.0–34.0)
MCHC: 34.4 g/dL (ref 30.0–36.0)
MCV: 90.2 fL (ref 78.0–100.0)
Monocytes Absolute: 0.3 10*3/uL (ref 0.1–1.0)
Monocytes Relative: 10 % (ref 3–12)
NEUTROS PCT: 47 % (ref 43–77)
Neutro Abs: 1.4 10*3/uL — ABNORMAL LOW (ref 1.7–7.7)
PLATELETS: 330 10*3/uL (ref 150–400)
RBC: 4.19 MIL/uL (ref 3.87–5.11)
RDW: 13.6 % (ref 11.5–15.5)
WBC: 2.9 10*3/uL — ABNORMAL LOW (ref 4.0–10.5)

## 2013-12-05 LAB — TSH: TSH: 1.935 u[IU]/mL (ref 0.350–4.500)

## 2013-12-05 MED ORDER — FLUOXETINE HCL 20 MG PO CAPS: ORAL_CAPSULE | ORAL | Status: DC

## 2013-12-05 MED ORDER — HYDROCODONE-ACETAMINOPHEN 5-325 MG PO TABS: 1.0000 | ORAL_TABLET | Freq: Four times a day (QID) | ORAL | Status: DC | PRN

## 2013-12-05 MED ORDER — ALPRAZOLAM 0.25 MG PO TABS: 0.2500 mg | ORAL_TABLET | Freq: Three times a day (TID) | ORAL | Status: DC

## 2013-12-05 MED ORDER — FENOFIBRATE MICRONIZED 134 MG PO CAPS: ORAL_CAPSULE | ORAL | Status: DC

## 2013-12-05 NOTE — Progress Notes (Signed)
Assessment and Plan:  Hypertension: Continue medication, monitor blood pressure at home. Continue DASH diet.  Reminder to go to the ER if any CP, SOB, nausea, dizziness, severe HA, changes vision/speech, left arm numbness and tingling, and jaw pain. Cholesterol: Continue diet and exercise. Check cholesterol. She states she may want to go back on vytorin Pre-diabetes-Continue diet and exercise. Check A1C Vitamin D Def- check level and continue medications.  Leukopenia ? From statin will recheck CBC constipation add benefiber  Continue diet and meds as discussed. Further disposition pending results of labs.  HPI 53 y.o. female  presents for 3 month follow up with hypertension, hyperlipidemia, prediabetes and vitamin D. Her blood pressure has been controlled at home, today their BP is BP: 100/62 mmHg She does workout. She denies chest pain, shortness of breath, dizziness.  She is on cholesterol medication, she is on fenofibrate but off the vytorin to see if her CBC corrects off this and denies myalgias. Her cholesterol is at goal. The cholesterol last visit was:   Lab Results  Component Value Date   CHOL 207* 09/21/2013   HDL 65 09/21/2013   LDLCALC 117* 09/21/2013   TRIG 123 09/21/2013   CHOLHDL 3.2 09/21/2013   Lab Results  Component Value Date   WBC 3.6* 09/21/2013   HGB 12.0 09/21/2013   HCT 35.2* 09/21/2013   MCV 92.4 09/21/2013   PLT 323 09/21/2013   She has been working on diet and exercise for prediabetes, and denies paresthesia of the feet, polydipsia, polyuria and visual disturbances. Last A1C in the office was:  Lab Results  Component Value Date   HGBA1C 5.9* 07/26/2013   Patient is on Vitamin D supplement.   Lab Results  Component Value Date   VD25OH 85 07/26/2013     Eye: Dr. Charlene Brooke, had DEE last week.   Current Medications:  Current Outpatient Prescriptions on File Prior to Visit  Medication Sig Dispense Refill  . albuterol (PROVENTIL HFA;VENTOLIN HFA)  108 (90 BASE) MCG/ACT inhaler Inhale 2 puffs into the lungs every 6 (six) hours as needed for wheezing or shortness of breath. 1 Inhaler 2  . ALPRAZolam (XANAX) 0.25 MG tablet Take 1 tablet (0.25 mg total) by mouth 3 (three) times daily. 90 tablet 1  . Bioflavonoid Products (PERIDIN-C PO) Take 200 mg by mouth. Peridin c 2 tabs three times a day for hot flashes     . cephALEXin (KEFLEX) 500 MG capsule TAKE 1 CAPSULE BY MOUTH TWICE A DAY 180 capsule 1  . Coenzyme Q10 (CO Q 10 PO) Take 200 mg by mouth daily.      Marland Kitchen ezetimibe-simvastatin (VYTORIN) 10-40 MG per tablet Take 1 tablet by mouth at bedtime. 30 tablet 6  . fenofibrate micronized (LOFIBRA) 134 MG capsule TAKE 1 CAPSULE BY MOUTH ONCE DAILY (NEED OFFICE VISIT FOR REFILLS) 90 capsule 1  . FLUoxetine (PROZAC) 20 MG capsule TAKE 1 CAPSULE BY MOUTH TWICE DAILY (NEED OFFICE VISIT FOR REFILLS) 180 capsule 1  . HYDROcodone-acetaminophen (NORCO/VICODIN) 5-325 MG per tablet Take 1 tablet by mouth every 6 (six) hours as needed for moderate pain.    Marland Kitchen ibuprofen (ADVIL) 200 MG tablet Take 200 mg by mouth every 6 (six) hours as needed.      Marland Kitchen levothyroxine (SYNTHROID, LEVOTHROID) 75 MCG tablet TAKE 1 TABLET BY MOUTH ONCE DAILY 90 tablet PRN  . loratadine (CLARITIN) 10 MG tablet Take 10 mg by mouth as needed.      . Magnesium 250 MG TABS  Take 250 mg by mouth daily.    . Multiple Vitamin (MULTIVITAMIN) tablet Take 1 tablet by mouth daily.      Marland Kitchen omeprazole (PRILOSEC) 20 MG capsule Take 20 mg by mouth daily.      . Vitamin D, Ergocalciferol, (DRISDOL) 50000 UNITS CAPS capsule TAKE AS DIRECTED (NEEDS OFFICE VISIT IN NEXT 1-2 MONTHS FOR MORE REFILLS) 24 capsule 1   Current Facility-Administered Medications on File Prior to Visit  Medication Dose Route Frequency Provider Last Rate Last Dose  . 0.9 %  sodium chloride infusion  500 mL Intravenous Continuous Ladene Artist, MD       Medical History:  Past Medical History  Diagnosis Date  . PCO (polycystic  ovaries)   . Anxiety   . Arthritis   . Depression   . GERD (gastroesophageal reflux disease)   . Hyperlipidemia   . Hypothyroidism, adult     hypothyroidism  . Pre-diabetes   . Chronic back pain   . Post-menopausal   . Plantar fasciitis   . Acne   . Cervical polyp    Allergies:  Allergies  Allergen Reactions  . Advicor [Niacin-Lovastatin Er] Swelling    Facial swelling  . Niacin-Lovastatin Er Swelling and Other (See Comments)    Extreme flushing     Review of Systems: [X]  = complains of  [ ]  = denies  General: Fatigue [ ]  Fever [ ]  Chills [ ]  Weakness [ ]   Insomnia [ ]  Eyes: Redness [ ]  Blurred vision [ ]  Diplopia [ ]   ENT: Congestion [ ]  Sinus Pain [ ]  Post Nasal Drip [ ]  Sore Throat [ ]  Earache [ ]   Cardiac: Chest pain/pressure [ ]  SOB [ ]  Orthopnea [ ]   Palpitations [ ]   Paroxysmal nocturnal dyspnea[ ]  Claudication [ ]  Edema [ ]   Pulmonary: Cough [ ]  Wheezing[ ]   SOB [ ]   Snoring [ ]   GI: Nausea [ ]  Vomiting[ ]  Dysphagia[ ]  Heartburn[ ]  Abdominal pain [ ]  Constipation [ ] ; Diarrhea [ ] ; BRBPR [ ]  Melena[ ]  GU: Hematuria[ ]  Dysuria [ ]  Nocturia[ ]  Urgency [ ]   Hesitancy [ ]  Discharge [ ]  Neuro: Headaches[ ]  Vertigo[ ]  Paresthesias[ ]  Spasm [ ]  Speech changes [ ]  Incoordination [ ]   Ortho: Arthritis [ ]  Joint pain [ ]  Muscle pain [ ]  Joint swelling [ ]  Back Pain [ ]  Skin:  Rash [ ]   Pruritis [ ]  Change in skin lesion [ ]   Psych: Depression[ ]  Anxiety[ ]  Confusion [ ]  Memory loss [ ]   Heme/Lypmh: Bleeding [ ]  Bruising [ ]  Enlarged lymph nodes [ ]   Endocrine: Visual blurring [ ]  Paresthesia [ ]  Polyuria [ ]  Polydypsea [ ]    Heat/cold intolerance [ ]  Hypoglycemia [ ]   Family history- Review and unchanged Social history- Review and unchanged Physical Exam: BP 100/62 mmHg  Pulse 60  Temp(Src) 97.7 F (36.5 C)  Resp 16  Ht 5' 1.75" (1.568 m)  Wt 147 lb (66.679 kg)  BMI 27.12 kg/m2 Wt Readings from Last 3 Encounters:  12/05/13 147 lb (66.679 kg)  07/26/13 148 lb  (67.132 kg)  02/07/13 150 lb (68.04 kg)   General Appearance: Well nourished, in no apparent distress. Eyes: PERRLA, EOMs, conjunctiva no swelling or erythema Sinuses: No Frontal/maxillary tenderness ENT/Mouth: Ext aud canals clear, TMs without erythema, bulging. No erythema, swelling, or exudate on post pharynx.  Tonsils not swollen or erythematous. Hearing normal.  Neck: Supple, thyroid normal.  Respiratory: Respiratory effort normal,  BS equal bilaterally without rales, rhonchi, wheezing or stridor.  Cardio: RRR with no MRGs. Brisk peripheral pulses without edema.  Abdomen: Soft, + BS.  Non tender, no guarding, rebound, hernias, masses. Lymphatics: Non tender without lymphadenopathy.  Musculoskeletal: Full ROM, 5/5 strength, normal gait.  Skin: Warm, dry without rashes, lesions, ecchymosis.  Neuro: Cranial nerves intact. Normal muscle tone, no cerebellar symptoms. Sensation intact.  Psych: Awake and oriented X 3, normal affect, Insight and Judgment appropriate.    Vicie Mutters, PA-C 10:11 AM [facility]

## 2013-12-05 NOTE — Patient Instructions (Signed)
Benefiber is good for constipation/diarrhea/irritable bowel syndrome, it helps with weight loss and can help lower your bad cholesterol. Please do 1-2 TBSP in the morning in water, coffee, or tea. It can take up to a month before you can see a difference with your bowel movements. It is cheapest from [employer], [employer], [employer].      Bad carbs also include fruit juice, alcohol, and sweet tea. These are empty calories that do not signal to your brain that you are full.   Please remember the good carbs are still carbs which convert into sugar. So please measure them out no more than 1/2-1 cup of rice, oatmeal, pasta, and beans  Veggies are however free foods! Pile them on.   Not all fruit is created equal. Please see the list below, the fruit at the bottom is higher in sugars than the fruit at the top. Please avoid all dried fruits.     

## 2013-12-06 ENCOUNTER — Encounter: Payer: Self-pay | Admitting: Internal Medicine

## 2013-12-06 ENCOUNTER — Encounter: Payer: Self-pay | Admitting: Physician Assistant

## 2013-12-06 LAB — BASIC METABOLIC PANEL WITH GFR
BUN: 12 mg/dL (ref 6–23)
CHLORIDE: 102 meq/L (ref 96–112)
CO2: 27 mEq/L (ref 19–32)
Calcium: 10.3 mg/dL (ref 8.4–10.5)
Creat: 0.88 mg/dL (ref 0.50–1.10)
GFR, EST AFRICAN AMERICAN: 87 mL/min
GFR, EST NON AFRICAN AMERICAN: 75 mL/min
Glucose, Bld: 95 mg/dL (ref 70–99)
POTASSIUM: 4.6 meq/L (ref 3.5–5.3)
Sodium: 139 mEq/L (ref 135–145)

## 2013-12-06 LAB — HEPATIC FUNCTION PANEL
ALT: 21 U/L (ref 0–35)
AST: 24 U/L (ref 0–37)
Albumin: 4.7 g/dL (ref 3.5–5.2)
Alkaline Phosphatase: 36 U/L — ABNORMAL LOW (ref 39–117)
BILIRUBIN INDIRECT: 0.2 mg/dL (ref 0.2–1.2)
BILIRUBIN TOTAL: 0.3 mg/dL (ref 0.2–1.2)
Bilirubin, Direct: 0.1 mg/dL (ref 0.0–0.3)
TOTAL PROTEIN: 6.9 g/dL (ref 6.0–8.3)

## 2013-12-06 LAB — LIPID PANEL
CHOL/HDL RATIO: 3.2 ratio
Cholesterol: 269 mg/dL — ABNORMAL HIGH (ref 0–200)
HDL: 83 mg/dL (ref >39)
LDL CALC: 168 mg/dL — AB (ref 0–99)
TRIGLYCERIDES: 92 mg/dL (ref <150)
VLDL: 18 mg/dL (ref 0–40)

## 2013-12-06 LAB — HEMOGLOBIN A1C
Hgb A1c MFr Bld: 5.5 % (ref <5.7)
Mean Plasma Glucose: 111 mg/dL (ref <117)

## 2013-12-06 LAB — VITAMIN D 25 HYDROXY (VIT D DEFICIENCY, FRACTURES): Vit D, 25-Hydroxy: 82 ng/mL (ref 30–89)

## 2014-02-06 ENCOUNTER — Other Ambulatory Visit: Payer: Self-pay | Admitting: Physician Assistant

## 2014-02-07 ENCOUNTER — Other Ambulatory Visit: Payer: Self-pay | Admitting: Physician Assistant

## 2014-03-22 ENCOUNTER — Ambulatory Visit: Payer: Self-pay | Admitting: Physician Assistant

## 2014-04-05 ENCOUNTER — Ambulatory Visit: Payer: Self-pay | Admitting: Physician Assistant

## 2014-04-18 ENCOUNTER — Ambulatory Visit (INDEPENDENT_AMBULATORY_CARE_PROVIDER_SITE_OTHER): Payer: 59 | Admitting: Physician Assistant

## 2014-04-18 ENCOUNTER — Other Ambulatory Visit: Payer: Self-pay

## 2014-04-18 ENCOUNTER — Encounter: Payer: Self-pay | Admitting: Physician Assistant

## 2014-04-18 VITALS — BP 110/62 | HR 68 | Temp 97.7°F | Resp 16 | Ht 61.75 in | Wt 145.0 lb

## 2014-04-18 DIAGNOSIS — Z79899 Other long term (current) drug therapy: Secondary | ICD-10-CM

## 2014-04-18 DIAGNOSIS — R7309 Other abnormal glucose: Secondary | ICD-10-CM

## 2014-04-18 DIAGNOSIS — E079 Disorder of thyroid, unspecified: Secondary | ICD-10-CM

## 2014-04-18 DIAGNOSIS — E559 Vitamin D deficiency, unspecified: Secondary | ICD-10-CM

## 2014-04-18 DIAGNOSIS — R7303 Prediabetes: Secondary | ICD-10-CM

## 2014-04-18 DIAGNOSIS — F419 Anxiety disorder, unspecified: Secondary | ICD-10-CM

## 2014-04-18 DIAGNOSIS — K5900 Constipation, unspecified: Secondary | ICD-10-CM

## 2014-04-18 DIAGNOSIS — E785 Hyperlipidemia, unspecified: Secondary | ICD-10-CM

## 2014-04-18 LAB — CBC WITH DIFFERENTIAL/PLATELET
BASOS ABS: 0 10*3/uL (ref 0.0–0.1)
Basophils Relative: 1 % (ref 0–1)
Eosinophils Absolute: 0.1 10*3/uL (ref 0.0–0.7)
Eosinophils Relative: 2 % (ref 0–5)
HCT: 36.3 % (ref 36.0–46.0)
HEMOGLOBIN: 12.1 g/dL (ref 12.0–15.0)
LYMPHS PCT: 37 % (ref 12–46)
Lymphs Abs: 1.2 10*3/uL (ref 0.7–4.0)
MCH: 30.8 pg (ref 26.0–34.0)
MCHC: 33.3 g/dL (ref 30.0–36.0)
MCV: 92.4 fL (ref 78.0–100.0)
MONO ABS: 0.3 10*3/uL (ref 0.1–1.0)
MONOS PCT: 9 % (ref 3–12)
MPV: 9 fL (ref 8.6–12.4)
NEUTROS ABS: 1.7 10*3/uL (ref 1.7–7.7)
Neutrophils Relative %: 51 % (ref 43–77)
Platelets: 295 10*3/uL (ref 150–400)
RBC: 3.93 MIL/uL (ref 3.87–5.11)
RDW: 13.3 % (ref 11.5–15.5)
WBC: 3.3 10*3/uL — ABNORMAL LOW (ref 4.0–10.5)

## 2014-04-18 MED ORDER — ALPRAZOLAM 0.25 MG PO TABS: 0.2500 mg | ORAL_TABLET | Freq: Three times a day (TID) | ORAL | Status: DC

## 2014-04-18 MED ORDER — CEPHALEXIN 500 MG PO CAPS: ORAL_CAPSULE | ORAL | Status: DC

## 2014-04-18 MED ORDER — VITAMIN D (ERGOCALCIFEROL) 1.25 MG (50000 UNIT) PO CAPS: ORAL_CAPSULE | ORAL | Status: DC

## 2014-04-18 MED ORDER — VITAMIN D (ERGOCALCIFEROL) 1.25 MG (50000 UNIT) PO CAPS: 50000.0000 [IU] | ORAL_CAPSULE | ORAL | Status: DC

## 2014-04-18 NOTE — Patient Instructions (Addendum)
Benefiber is good for constipation/diarrhea/irritable bowel syndrome, it helps with weight loss and can help lower your bad cholesterol. Please do 1-2 TBSP in the morning in water, coffee, or tea. It can take up to a month before you can see a difference with your bowel movements. It is cheapest from [employer], [employer], [employer].   Your ears and sinuses are connected by the eustachian tube. When your sinuses are inflamed, this can close off the tube and cause fluid to collect in your middle ear. This can then cause dizziness, popping, clicking, ringing, and echoing in your ears. This is often NOT an infection and does NOT require antibiotics, it is caused by inflammation so the treatments help the inflammation. This can take a long time to get better so please be patient.  Here are things you can do to help with this: - Try the Flonase or Nasonex. Remember to spray each nostril twice towards the outer part of your eye.  Do not sniff but instead pinch your nose and tilt your head back to help the medicine get into your sinuses.  The best time to do this is at bedtime.Stop if you get blurred vision or nose bleeds.  -While drinking fluids, pinch and hold nose close and swallow, to help open eustachian tubes to drain fluid behind ear drums. -Please pick one of the over the counter allergy medications below and take it once daily for allergies.  It will also help with fluid behind ear drums. Claritin or loratadine cheapest but likely the weakest  Zyrtec or certizine at night because it can make you sleepy The strongest is allegra or fexafinadine  Cheapest at [employer], [employer], [employer] -can use decongestant over the counter, please do not use if you have high blood pressure or certain heart conditions.   if worsening HA, changes vision/speech, imbalance, weakness go to the ER

## 2014-04-18 NOTE — Progress Notes (Signed)
Assessment and Plan:  1. Hypertension -Continue medication, monitor blood pressure at home. Continue DASH diet.  Reminder to go to the ER if any CP, SOB, nausea, dizziness, severe HA, changes vision/speech, left arm numbness and tingling and jaw pain.  2. Cholesterol -Continue diet and exercise. Check cholesterol.   3. Prediabetes  -Continue diet and exercise. Check A1C  4. Vitamin D Def - check level and continue medications.   5. Constipation Try benefiber.   6. Hypothyroidism -check TSH level, continue medications the same, reminded to take on an empty stomach 30-35mins before food.    Continue diet and meds as discussed. Further disposition pending results of labs. Over 30 minutes of exam, counseling, chart review, and critical decision making was performed  Future Appointments Date Time Provider [place]  08/07/2014 9:00 AM Vicie Mutters, PA-C [id number] None     HPI 54 y.o. female  presents for 3 month follow up on hypertension, cholesterol, prediabetes, and vitamin D deficiency.   Her blood pressure has been controlled at home, today their BP is BP: 110/62 mmHg  She does workout. She denies chest pain, shortness of breath, dizziness.  She is back on cholesterol medication and vytorin 3 times a week and fenofibrate denies myalgias. Last visit there was a question if the vytorin was contributing to her leukopenia, she was off it for 1-2 months and it did not improve so she is back on it.  Her cholesterol is not at goal. The cholesterol last visit was:   Lab Results  Component Value Date   CHOL 269* 12/05/2013   HDL 83 12/05/2013   LDLCALC 168* 12/05/2013   TRIG 92 12/05/2013   CHOLHDL 3.2 12/05/2013   Lab Results  Component Value Date   WBC 2.9* 12/05/2013   HGB 13.0 12/05/2013   HCT 37.8 12/05/2013   MCV 90.2 12/05/2013   PLT 330 12/05/2013   She has been working on diet and exercise for prediabetes (A1C 5.9 on 07/26/2013), and denies paresthesia of the  feet, polydipsia, polyuria and visual disturbances. Last A1C in the office was:  Lab Results  Component Value Date   HGBA1C 5.5 12/05/2013  Patient is on Vitamin D supplement.   Lab Results  Component Value Date   VD25OH 32 12/05/2013   She is on thyroid medication. Her medication was not changed last visit.   Lab Results  Component Value Date   TSH 1.935 12/05/2013  .      Current Medications:  Current Outpatient Prescriptions on File Prior to Visit  Medication Sig Dispense Refill  . albuterol (PROVENTIL HFA;VENTOLIN HFA) 108 (90 BASE) MCG/ACT inhaler Inhale 2 puffs into the lungs every 6 (six) hours as needed for wheezing or shortness of breath. 1 Inhaler 2  . ALPRAZolam (XANAX) 0.25 MG tablet Take 1 tablet (0.25 mg total) by mouth 3 (three) times daily. 90 tablet 1  . Bioflavonoid Products (PERIDIN-C PO) Take 200 mg by mouth. Peridin c 2 tabs three times a day for hot flashes     . cephALEXin (KEFLEX) 500 MG capsule TAKE 1 TABLET BY MOUTH TWICE A DAY 180 capsule 0  . Coenzyme Q10 (CO Q 10 PO) Take 200 mg by mouth daily.      Marland Kitchen ezetimibe-simvastatin (VYTORIN) 10-40 MG per tablet Take 1 tablet by mouth at bedtime. 30 tablet 6  . fenofibrate micronized (LOFIBRA) 134 MG capsule TAKE 1 CAPSULE BY MOUTH ONCE DAILY 90 capsule 1  . FLUoxetine (PROZAC) 20 MG capsule TAKE 1  CAPSULE BY MOUTH TWICE DAILY (NEED OFFICE VISIT FOR REFILLS) 180 capsule 0  . HYDROcodone-acetaminophen (NORCO/VICODIN) 5-325 MG per tablet Take 1 tablet by mouth every 6 (six) hours as needed for moderate pain. 30 tablet 0  . ibuprofen (ADVIL) 200 MG tablet Take 200 mg by mouth every 6 (six) hours as needed.      Marland Kitchen levothyroxine (SYNTHROID, LEVOTHROID) 75 MCG tablet TAKE 1 TABLET BY MOUTH ONCE DAILY 90 tablet PRN  . loratadine (CLARITIN) 10 MG tablet Take 10 mg by mouth as needed.      . Multiple Vitamin (MULTIVITAMIN) tablet Take 1 tablet by mouth daily.      Marland Kitchen omeprazole (PRILOSEC) 20 MG capsule Take 20 mg by mouth  daily.      . Vitamin D, Ergocalciferol, (DRISDOL) 50000 UNITS CAPS capsule TAKE AS DIRECTED (NEEDS OFFICE VISIT IN NEXT 1-2 MONTHS FOR MORE REFILLS) 24 capsule 1   Current Facility-Administered Medications on File Prior to Visit  Medication Dose Route Frequency Provider Last Rate Last Dose  . 0.9 %  sodium chloride infusion  500 mL Intravenous Continuous Ladene Artist, MD       Medical History:  Past Medical History  Diagnosis Date  . PCO (polycystic ovaries)   . Anxiety   . Arthritis   . Depression   . GERD (gastroesophageal reflux disease)   . Hyperlipidemia   . Hypothyroidism, adult     hypothyroidism  . Pre-diabetes   . Chronic back pain   . Post-menopausal   . Plantar fasciitis   . Acne   . Cervical polyp    Allergies:  Allergies  Allergen Reactions  . Advicor [Niacin-Lovastatin Er] Swelling    Facial swelling  . Niacin-Lovastatin Er Swelling and Other (See Comments)    Extreme flushing     Review of Systems:  Review of Systems  Constitutional: Negative.   HENT: Negative.   Eyes: Negative.   Respiratory: Negative.   Cardiovascular: Negative.   Gastrointestinal: Positive for constipation. Negative for heartburn, vomiting, abdominal pain, diarrhea, blood in stool and melena.  Genitourinary: Negative.   Musculoskeletal: Positive for back pain. Negative for myalgias, joint pain, falls and neck pain.  Skin: Negative.   Neurological: Negative.   Endo/Heme/Allergies: Negative.   Psychiatric/Behavioral: Negative.     Family history- Review and unchanged Social history- Review and unchanged Physical Exam: BP 110/62 mmHg  Pulse 68  Temp(Src) 97.7 F (36.5 C)  Resp 16  Ht 5' 1.75" (1.568 m)  Wt 145 lb (65.772 kg)  BMI 26.75 kg/m2 Wt Readings from Last 3 Encounters:  04/18/14 145 lb (65.772 kg)  12/05/13 147 lb (66.679 kg)  07/26/13 148 lb (67.132 kg)   General Appearance: Well nourished, in no apparent distress. Eyes: PERRLA, EOMs, conjunctiva no  swelling or erythema Sinuses: No Frontal/maxillary tenderness ENT/Mouth: Ext aud canals clear, TMs without erythema, bulging. No erythema, swelling, or exudate on post pharynx.  Tonsils not swollen or erythematous. Hearing normal.  Neck: Supple, thyroid normal.  Respiratory: Respiratory effort normal, BS equal bilaterally without rales, rhonchi, wheezing or stridor.  Cardio: RRR with no MRGs. Brisk peripheral pulses without edema.  Abdomen: Soft, + BS,  Non tender, no guarding, rebound, hernias, masses. Lymphatics: Non tender without lymphadenopathy.  Musculoskeletal: Full ROM, 5/5 strength, Normal gait Skin: Warm, dry without rashes, lesions, ecchymosis.  Neuro: Cranial nerves intact. Normal muscle tone, no cerebellar symptoms. Psych: Awake and oriented X 3, normal affect, Insight and Judgment appropriate.    Vicie Mutters, PA-C  3:57 PM [facility]

## 2014-04-19 LAB — LIPID PANEL
CHOL/HDL RATIO: 2.1 ratio
CHOLESTEROL: 152 mg/dL (ref 0–200)
HDL: 71 mg/dL (ref >=46)
LDL Cholesterol: 71 mg/dL (ref 0–99)
TRIGLYCERIDES: 49 mg/dL (ref <150)
VLDL: 10 mg/dL (ref 0–40)

## 2014-04-19 LAB — MAGNESIUM: Magnesium: 2.2 mg/dL (ref 1.5–2.5)

## 2014-04-19 LAB — HEPATIC FUNCTION PANEL
ALT: 20 U/L (ref 0–35)
AST: 24 U/L (ref 0–37)
Albumin: 4.3 g/dL (ref 3.5–5.2)
Alkaline Phosphatase: 33 U/L — ABNORMAL LOW (ref 39–117)
BILIRUBIN TOTAL: 0.3 mg/dL (ref 0.2–1.2)
Bilirubin, Direct: 0.1 mg/dL (ref 0.0–0.3)
Indirect Bilirubin: 0.2 mg/dL (ref 0.2–1.2)
TOTAL PROTEIN: 6.2 g/dL (ref 6.0–8.3)

## 2014-04-19 LAB — HEMOGLOBIN A1C
HEMOGLOBIN A1C: 5.5 % (ref <5.7)
Mean Plasma Glucose: 111 mg/dL (ref <117)

## 2014-04-19 LAB — BASIC METABOLIC PANEL WITH GFR
BUN: 8 mg/dL (ref 6–23)
CO2: 26 meq/L (ref 19–32)
CREATININE: 0.67 mg/dL (ref 0.50–1.10)
Calcium: 9.4 mg/dL (ref 8.4–10.5)
Chloride: 101 mEq/L (ref 96–112)
GFR, Est African American: >89 mL/min
GFR, Est Non African American: >89 mL/min
Glucose, Bld: 86 mg/dL (ref 70–99)
Potassium: 4.2 mEq/L (ref 3.5–5.3)
Sodium: 139 mEq/L (ref 135–145)

## 2014-04-19 LAB — INSULIN, FASTING: Insulin fasting, serum: 6.7 u[IU]/mL (ref 2.0–19.6)

## 2014-04-19 LAB — VITAMIN D 25 HYDROXY (VIT D DEFICIENCY, FRACTURES): VIT D 25 HYDROXY: 68 ng/mL (ref 30–100)

## 2014-04-19 LAB — TSH: TSH: 0.473 u[IU]/mL (ref 0.350–4.500)

## 2014-05-22 ENCOUNTER — Other Ambulatory Visit: Payer: Self-pay | Admitting: Physician Assistant

## 2014-06-01 ENCOUNTER — Other Ambulatory Visit: Payer: Self-pay

## 2014-06-01 DIAGNOSIS — Z1231 Encounter for screening mammogram for malignant neoplasm of breast: Secondary | ICD-10-CM

## 2014-07-12 ENCOUNTER — Ambulatory Visit
Admission: RE | Admit: 2014-07-12 | Discharge: 2014-07-12 | Disposition: A | Discharge status: Home / Self Care | Payer: 59 | Source: Ambulatory Visit

## 2014-07-12 DIAGNOSIS — Z1231 Encounter for screening mammogram for malignant neoplasm of breast: Secondary | ICD-10-CM

## 2014-07-31 ENCOUNTER — Encounter: Payer: Self-pay | Admitting: Emergency Medicine

## 2014-08-01 ENCOUNTER — Other Ambulatory Visit: Payer: Self-pay | Admitting: Physician Assistant

## 2014-08-07 ENCOUNTER — Ambulatory Visit (INDEPENDENT_AMBULATORY_CARE_PROVIDER_SITE_OTHER): Payer: 59 | Admitting: Physician Assistant

## 2014-08-07 ENCOUNTER — Encounter: Payer: Self-pay | Admitting: Physician Assistant

## 2014-08-07 VITALS — BP 110/70 | HR 64 | Temp 97.7°F | Resp 16 | Ht 61.75 in | Wt 147.0 lb

## 2014-08-07 DIAGNOSIS — E282 Polycystic ovarian syndrome: Secondary | ICD-10-CM

## 2014-08-07 DIAGNOSIS — Z0001 Encounter for general adult medical examination with abnormal findings: Secondary | ICD-10-CM

## 2014-08-07 DIAGNOSIS — E559 Vitamin D deficiency, unspecified: Secondary | ICD-10-CM

## 2014-08-07 DIAGNOSIS — E785 Hyperlipidemia, unspecified: Secondary | ICD-10-CM

## 2014-08-07 DIAGNOSIS — H9192 Unspecified hearing loss, left ear: Secondary | ICD-10-CM

## 2014-08-07 DIAGNOSIS — Z0000 Encounter for general adult medical examination without abnormal findings: Secondary | ICD-10-CM

## 2014-08-07 DIAGNOSIS — R6889 Other general symptoms and signs: Secondary | ICD-10-CM

## 2014-08-07 DIAGNOSIS — Z9109 Other allergy status, other than to drugs and biological substances: Secondary | ICD-10-CM

## 2014-08-07 DIAGNOSIS — G8929 Other chronic pain: Secondary | ICD-10-CM

## 2014-08-07 DIAGNOSIS — D62 Acute posthemorrhagic anemia: Secondary | ICD-10-CM

## 2014-08-07 DIAGNOSIS — L709 Acne, unspecified: Secondary | ICD-10-CM

## 2014-08-07 DIAGNOSIS — E079 Disorder of thyroid, unspecified: Secondary | ICD-10-CM

## 2014-08-07 DIAGNOSIS — Z1159 Encounter for screening for other viral diseases: Secondary | ICD-10-CM

## 2014-08-07 DIAGNOSIS — Z79899 Other long term (current) drug therapy: Secondary | ICD-10-CM

## 2014-08-07 DIAGNOSIS — M549 Dorsalgia, unspecified: Secondary | ICD-10-CM

## 2014-08-07 DIAGNOSIS — R7303 Prediabetes: Secondary | ICD-10-CM

## 2014-08-07 DIAGNOSIS — F419 Anxiety disorder, unspecified: Secondary | ICD-10-CM

## 2014-08-07 LAB — CBC WITH DIFFERENTIAL/PLATELET
BASOS ABS: 0 10*3/uL (ref 0.0–0.1)
Basophils Relative: 1 % (ref 0–1)
EOS PCT: 3 % (ref 0–5)
Eosinophils Absolute: 0.1 10*3/uL (ref 0.0–0.7)
HCT: 39.4 % (ref 36.0–46.0)
Hemoglobin: 13.2 g/dL (ref 12.0–15.0)
Lymphocytes Relative: 42 % (ref 12–46)
Lymphs Abs: 1.2 10*3/uL (ref 0.7–4.0)
MCH: 31.3 pg (ref 26.0–34.0)
MCHC: 33.5 g/dL (ref 30.0–36.0)
MCV: 93.4 fL (ref 78.0–100.0)
MONOS PCT: 11 % (ref 3–12)
MPV: 9.1 fL (ref 8.6–12.4)
Monocytes Absolute: 0.3 10*3/uL (ref 0.1–1.0)
NEUTROS ABS: 1.2 10*3/uL — AB (ref 1.7–7.7)
NEUTROS PCT: 43 % (ref 43–77)
Platelets: 310 10*3/uL (ref 150–400)
RBC: 4.22 MIL/uL (ref 3.87–5.11)
RDW: 13.4 % (ref 11.5–15.5)
WBC: 2.8 10*3/uL — AB (ref 4.0–10.5)

## 2014-08-07 MED ORDER — MOMETASONE FUROATE 50 MCG/ACT NA SUSP: 2.0000 | Freq: Every day | NASAL | Status: DC

## 2014-08-07 MED ORDER — EZETIMIBE-SIMVASTATIN 10-40 MG PO TABS: 1.0000 | ORAL_TABLET | Freq: Every day | ORAL | Status: DC

## 2014-08-07 NOTE — Patient Instructions (Signed)
[employer] with no obligation # [phone] Do not have to be a member Tues-Sat 10-6  [place]- free test with no obligation # [phone] MUST BE A MEMBER Call for store hours  Preventive Care for Adults  A healthy lifestyle and preventive care can promote health and wellness. Preventive health guidelines for women include the following key practices.  A routine yearly physical is a good way to check with your health care provider about your health and preventive screening. It is a chance to share any concerns and updates on your health and to receive a thorough exam.  Visit your dentist for a routine exam and preventive care every 6 months. Brush your teeth twice a day and floss once a day. Good oral hygiene prevents tooth decay and gum disease.  The frequency of eye exams is based on your age, health, family medical history, use of contact lenses, and other factors. Follow your health care provider's recommendations for frequency of eye exams.  Eat a healthy diet. Foods like vegetables, fruits, whole grains, low-fat dairy products, and lean protein foods contain the nutrients you need without too many calories. Decrease your intake of foods high in solid fats, added sugars, and salt. Eat the right amount of calories for you.Get information about a proper diet from your health care provider, if necessary.  Regular physical exercise is one of the most important things you can do for your health. Most adults should get at least 150 minutes of moderate-intensity exercise (any activity that increases your heart rate and causes you to sweat) each week. In addition, most adults need muscle-strengthening exercises on 2 or more days a week.  Maintain a healthy weight. The body mass index (BMI) is a screening tool to identify possible weight problems. It provides an estimate of body fat based on height and weight. Your health care provider can find your BMI and can  help you achieve or maintain a healthy weight.For adults 20 years and older:  A BMI below 18.5 is considered underweight.  A BMI of 18.5 to 24.9 is normal.  A BMI of 25 to 29.9 is considered overweight.  A BMI of 30 and above is considered obese.  Maintain normal blood lipids and cholesterol levels by exercising and minimizing your intake of saturated fat. Eat a balanced diet with plenty of fruit and vegetables. Blood tests for lipids and cholesterol should begin at age 74 and be repeated every 5 years. If your lipid or cholesterol levels are high, you are over 50, or you are at high risk for heart disease, you may need your cholesterol levels checked more frequently.Ongoing high lipid and cholesterol levels should be treated with medicines if diet and exercise are not working.  If you smoke, find out from your health care provider how to quit. If you do not use tobacco, do not start.  Lung cancer screening is recommended for adults aged 42-80 years who are at high risk for developing lung cancer because of a history of smoking. A yearly low-dose CT scan of the lungs is recommended for people who have at least a 30-pack-year history of smoking and are a current smoker or have quit within the past 15 years. A pack year of smoking is smoking an average of 1 pack of cigarettes a day for 1 year (for example: 1 pack a day for 30 years or 2 packs a day for 15 years). Yearly screening should continue until the smoker  has stopped smoking for at least 15 years. Yearly screening should be stopped for people who develop a health problem that would prevent them from having lung cancer treatment.  High blood pressure causes heart disease and increases the risk of stroke. Your blood pressure should be checked at least every 1 to 2 years. Ongoing high blood pressure should be treated with medicines if weight loss and exercise do not work.  If you are 56-54 years old, ask your health care provider if you should  take aspirin to prevent strokes.  Diabetes screening involves taking a blood sample to check your fasting blood sugar level. This should be done once every 3 years, after age 35, if you are within normal weight and without risk factors for diabetes. Testing should be considered at a younger age or be carried out more frequently if you are overweight and have at least 1 risk factor for diabetes.  Breast cancer screening is essential preventive care for women. You should practice "breast self-awareness." This means understanding the normal appearance and feel of your breasts and may include breast self-examination. Any changes detected, no matter how small, should be reported to a health care provider. Women in their 91s and 30s should have a clinical breast exam (CBE) by a health care provider as part of a regular health exam every 1 to 3 years. After age 57, women should have a CBE every year. Starting at age 93, women should consider having a mammogram (breast X-ray test) every year. Women who have a family history of breast cancer should talk to their health care provider about genetic screening. Women at a high risk of breast cancer should talk to their health care providers about having an MRI and a mammogram every year.  Breast cancer gene (BRCA)-related cancer risk assessment is recommended for women who have family members with BRCA-related cancers. BRCA-related cancers include breast, ovarian, tubal, and peritoneal cancers. Having family members with these cancers may be associated with an increased risk for harmful changes (mutations) in the breast cancer genes BRCA1 and BRCA2. Results of the assessment will determine the need for genetic counseling and BRCA1 and BRCA2 testing.  Routine pelvic exams to screen for cancer are no longer recommended for nonpregnant women who are considered low risk for cancer of the pelvic organs (ovaries, uterus, and vagina) and who do not have symptoms. Ask your  health care provider if a screening pelvic exam is right for you.  If you have had past treatment for cervical cancer or a condition that could lead to cancer, you need Pap tests and screening for cancer for at least 20 years after your treatment. If Pap tests have been discontinued, your risk factors (such as having a new sexual partner) need to be reassessed to determine if screening should be resumed. Some women have medical problems that increase the chance of getting cervical cancer. In these cases, your health care provider may recommend more frequent screening and Pap tests.  Colorectal cancer can be detected and often prevented. Most routine colorectal cancer screening begins at the age of 65 years and continues through age 79 years. However, your health care provider may recommend screening at an earlier age if you have risk factors for colon cancer. On a yearly basis, your health care provider may provide home test kits to check for hidden blood in the stool. Use of a small camera at the end of a tube, to directly examine the colon (sigmoidoscopy or colonoscopy), can detect  the earliest forms of colorectal cancer. Talk to your health care provider about this at age 51, when routine screening begins. Direct exam of the colon should be repeated every 5-10 years through age 2 years, unless early forms of pre-cancerous polyps or small growths are found.  Hepatitis C blood testing is recommended for all people born from [birth year] through 1965 and any individual with known risks for hepatitis C.  Pra  Osteoporosis is a disease in which the bones lose minerals and strength with aging. This can result in serious bone fractures or breaks. The risk of osteoporosis can be identified using a bone density scan. Women ages 12 years and over and women at risk for fractures or osteoporosis should discuss screening with their health care providers. Ask your health care provider whether you should take a calcium  supplement or vitamin D to reduce the rate of osteoporosis.  Menopause can be associated with physical symptoms and risks. Hormone replacement therapy is available to decrease symptoms and risks. You should talk to your health care provider about whether hormone replacement therapy is right for you.  Use sunscreen. Apply sunscreen liberally and repeatedly throughout the day. You should seek shade when your shadow is shorter than you. Protect yourself by wearing long sleeves, pants, a wide-brimmed hat, and sunglasses year round, whenever you are outdoors.  Once a month, do a whole body skin exam, using a mirror to look at the skin on your back. Tell your health care provider of new moles, moles that have irregular borders, moles that are larger than a pencil eraser, or moles that have changed in shape or color.  Stay current with required vaccines (immunizations).  Influenza vaccine. All adults should be immunized every year.  Tetanus, diphtheria, and acellular pertussis (Td, Tdap) vaccine. Pregnant women should receive 1 dose of Tdap vaccine during each pregnancy. The dose should be obtained regardless of the length of time since the last dose. Immunization is preferred during the 27th-36th week of gestation. An adult who has not previously received Tdap or who does not know her vaccine status should receive 1 dose of Tdap. This initial dose should be followed by tetanus and diphtheria toxoids (Td) booster doses every 10 years. Adults with an unknown or incomplete history of completing a 3-dose immunization series with Td-containing vaccines should begin or complete a primary immunization series including a Tdap dose. Adults should receive a Td booster every 10 years.  Varicella vaccine. An adult without evidence of immunity to varicella should receive 2 doses or a second dose if she has previously received 1 dose. Pregnant females who do not have evidence of immunity should receive the first dose  after pregnancy. This first dose should be obtained before leaving the health care facility. The second dose should be obtained 4-8 weeks after the first dose.  Human papillomavirus (HPV) vaccine. Females aged 13-26 years who have not received the vaccine previously should obtain the 3-dose series. The vaccine is not recommended for use in pregnant females. However, pregnancy testing is not needed before receiving a dose. If a female is found to be pregnant after receiving a dose, no treatment is needed. In that case, the remaining doses should be delayed until after the pregnancy. Immunization is recommended for any person with an immunocompromised condition through the age of 44 years if she did not get any or all doses earlier. During the 3-dose series, the second dose should be obtained 4-8 weeks after the first dose. The third  dose should be obtained 24 weeks after the first dose and 16 weeks after the second dose.  Zoster vaccine. One dose is recommended for adults aged 57 years or older unless certain conditions are present.  Measles, mumps, and rubella (MMR) vaccine. Adults born before [birth year] generally are considered immune to measles and mumps. Adults born in [birth year] or later should have 1 or more doses of MMR vaccine unless there is a contraindication to the vaccine or there is laboratory evidence of immunity to each of the three diseases. A routine second dose of MMR vaccine should be obtained at least 28 days after the first dose for students attending postsecondary schools, health care workers, or international travelers. People who received inactivated measles vaccine or an unknown type of measles vaccine during 1963-1967 should receive 2 doses of MMR vaccine. People who received inactivated mumps vaccine or an unknown type of mumps vaccine before 1979 and are at high risk for mumps infection should consider immunization with 2 doses of MMR vaccine. For females of childbearing age, rubella immunity  should be determined. If there is no evidence of immunity, females who are not pregnant should be vaccinated. If there is no evidence of immunity, females who are pregnant should delay immunization until after pregnancy. Unvaccinated health care workers born before [birth year] who lack laboratory evidence of measles, mumps, or rubella immunity or laboratory confirmation of disease should consider measles and mumps immunization with 2 doses of MMR vaccine or rubella immunization with 1 dose of MMR vaccine.  Pneumococcal 13-valent conjugate (PCV13) vaccine. When indicated, a person who is uncertain of her immunization history and has no record of immunization should receive the PCV13 vaccine. An adult aged 63 years or older who has certain medical conditions and has not been previously immunized should receive 1 dose of PCV13 vaccine. This PCV13 should be followed with a dose of pneumococcal polysaccharide (PPSV23) vaccine. The PPSV23 vaccine dose should be obtained at least 8 weeks after the dose of PCV13 vaccine. An adult aged 65 years or older who has certain medical conditions and previously received 1 or more doses of PPSV23 vaccine should receive 1 dose of PCV13. The PCV13 vaccine dose should be obtained 1 or more years after the last PPSV23 vaccine dose.    Pneumococcal polysaccharide (PPSV23) vaccine. When PCV13 is also indicated, PCV13 should be obtained first. All adults aged 41 years and older should be immunized. An adult younger than age 66 years who has certain medical conditions should be immunized. Any person who resides in a nursing home or long-term care facility should be immunized. An adult smoker should be immunized. People with an immunocompromised condition and certain other conditions should receive both PCV13 and PPSV23 vaccines. People with human immunodeficiency virus (HIV) infection should be immunized as soon as possible after diagnosis. Immunization during chemotherapy or radiation therapy  should be avoided. Routine use of PPSV23 vaccine is not recommended for American Indians, [place]atives, or people younger than 65 years unless there are medical conditions that require PPSV23 vaccine. When indicated, people who have unknown immunization and have no record of immunization should receive PPSV23 vaccine. One-time revaccination 5 years after the first dose of PPSV23 is recommended for people aged 19-64 years who have chronic kidney failure, nephrotic syndrome, asplenia, or immunocompromised conditions. People who received 1-2 doses of PPSV23 before age 33 years should receive another dose of PPSV23 vaccine at age 74 years or later if at least 5 years have passed since the previous  dose. Doses of PPSV23 are not needed for people immunized with PPSV23 at or after age 67 years.  Preventive Services / Frequency   Ages 81 to 23 years  Blood pressure check.  Lipid and cholesterol check.  Lung cancer screening. / Every year if you are aged 75-80 years and have a 30-pack-year history of smoking and currently smoke or have quit within the past 15 years. Yearly screening is stopped once you have quit smoking for at least 15 years or develop a health problem that would prevent you from having lung cancer treatment.  Clinical breast exam.** / Every year after age 10 years.  BRCA-related cancer risk assessment.** / For women who have family members with a BRCA-related cancer (breast, ovarian, tubal, or peritoneal cancers).  Mammogram.** / Every year beginning at age 42 years and continuing for as long as you are in good health. Consult with your health care provider.  Pap test.** / Every 3 years starting at age 46 years through age 42 or 28 years with a history of 3 consecutive normal Pap tests.  HPV screening.** / Every 3 years from ages 45 years through ages 84 to 85 years with a history of 3 consecutive normal Pap tests.  Fecal occult blood test (FOBT) of stool. / Every year beginning at  age 74 years and continuing until age 4 years. You may not need to do this test if you get a colonoscopy every 10 years.  Flexible sigmoidoscopy or colonoscopy.** / Every 5 years for a flexible sigmoidoscopy or every 10 years for a colonoscopy beginning at age 10 years and continuing until age 72 years.  Hepatitis C blood test.** / For all people born from [birth year] through 1965 and any individual with known risks for hepatitis C.  Skin self-exam. / Monthly.  Influenza vaccine. / Every year.  Tetanus, diphtheria, and acellular pertussis (Tdap/Td) vaccine.** / Consult your health care provider. Pregnant women should receive 1 dose of Tdap vaccine during each pregnancy. 1 dose of Td every 10 years.  Varicella vaccine.** / Consult your health care provider. Pregnant females who do not have evidence of immunity should receive the first dose after pregnancy.  Zoster vaccine.** / 1 dose for adults aged 44 years or older.  Pneumococcal 13-valent conjugate (PCV13) vaccine.** / Consult your health care provider.  Pneumococcal polysaccharide (PPSV23) vaccine.** / 1 to 2 doses if you smoke cigarettes or if you have certain conditions.  Meningococcal vaccine.** / Consult your health care provider.  Hepatitis A vaccine.** / Consult your health care provider.  Hepatitis B vaccine.** / Consult your health care provider. Screening for abdominal aortic aneurysm (AAA)  by ultrasound is recommended for people over 50 who have history of high blood pressure or who are current or former smokers.

## 2014-08-07 NOTE — Progress Notes (Signed)
Complete Physical Exam  Assessment and Plan:   1. Pre-diabetes Discussed general issues about diabetes pathophysiology and management., Educational material distributed., Suggested low cholesterol diet., Encouraged aerobic exercise., Discussed foot care., Reminded to get yearly retinal exam. - CBC with Differential/Platelet - BASIC METABOLIC PANEL WITH GFR - Hepatic function panel - Hemoglobin A1c - Insulin, fasting  2. Thyroid disease Hypothyroidism-check TSH level, continue medications the same, reminded to take on an empty stomach 30-8mins before food.  - TSH  3. Hyperlipidemia -continue medications, check lipids, decrease fatty foods, increase activity.  - Lipid panel - Urinalysis, Routine w reflex microscopic (not at [facility]) - Microalbumin / creatinine urine ratio  4. Vitamin D deficiency - Vit D  25 hydroxy (rtn osteoporosis monitoring)  5. PCO (polycystic ovaries) controlled  6. Acne, unspecified acne type controlled  7. Chronic back pain monitor  8. Anxiety continue medications, stress management techniques discussed, increase water, good sleep hygiene discussed, increase exercise, and increase veggies.   9. Environmental allergies Continue OTC allergy pills  10. Routine general medical examination at a health care facility  11. Medication management - Magnesium  12. Hearing loss, left With otitis effusion. Months, nasonex sent in and per patient request will send to ENT - Ambulatory referral to ENT  13. Anemia - Iron and TIBC - Ferritin - Vitamin B12  14. Screening for viral disease - HIV antibody  Over 40 minutes of exam, counseling, chart review and critical decision making was performed   Subjective:  Alison Alvarez is a 54 y.o. female who presents for CPE.    Her blood pressure has been controlled at home, today their BP is BP: 110/70 mmHg She does workout. She denies chest pain, shortness of breath, dizziness.  She is on cholesterol  medication vytorin daily and fenofibrate 3 x a week, questionable if the vytorin is contributing to leukopenia but she is being monitored and denies myalgias. She is on benefiber.  Her cholesterol is at goal. The cholesterol last visit was:   Lab Results  Component Value Date   CHOL 152 04/18/2014   HDL 71 04/18/2014   LDLCALC 71 04/18/2014   TRIG 49 04/18/2014   CHOLHDL 2.1 04/18/2014   She has had diabetes for 2 years (A1C 5.9 in 2014). She has been working on diet and exercise for prediabetes, and denies paresthesia of the feet, polydipsia, polyuria and visual disturbances. Last A1C in the office was:  Lab Results  Component Value Date   HGBA1C 5.5 04/18/2014   Patient is on Vitamin D supplement.   Lab Results  Component Value Date   VD25OH 68 04/18/2014     She is on thyroid medication. Her medication was not changed last visit.   Lab Results  Component Value Date   TSH 0.473 04/18/2014  .  She is on prozac and xanax PRN for anxiety which helps. Going to visit family in [place] and will xanax then.  States nasonex has helped with sinuses, has constant pressure in her ears and has some decreased hearing left greater than right.  Medication Review: Current Outpatient Prescriptions on File Prior to Visit  Medication Sig Dispense Refill  . albuterol (PROVENTIL HFA;VENTOLIN HFA) 108 (90 BASE) MCG/ACT inhaler Inhale 2 puffs into the lungs every 6 (six) hours as needed for wheezing or shortness of breath. 1 Inhaler 2  . ALPRAZolam (XANAX) 0.25 MG tablet Take 1 tablet (0.25 mg total) by mouth 3 (three) times daily. 90 tablet 1  . Bioflavonoid Products (  PERIDIN-C PO) Take 200 mg by mouth. Peridin c 2 tabs three times a day for hot flashes     . cephALEXin (KEFLEX) 500 MG capsule TAKE 1 CAPSULE BY MOUTH TWICE A DAY 180 capsule 1  . Coenzyme Q10 (CO Q 10 PO) Take 200 mg by mouth daily.      Marland Kitchen ezetimibe-simvastatin (VYTORIN) 10-40 MG per tablet Take 1 tablet by mouth at bedtime. 30 tablet 6  .  fenofibrate micronized (LOFIBRA) 134 MG capsule TAKE 1 CAPSULE BY MOUTH ONCE DAILY 90 capsule 1  . FLUoxetine (PROZAC) 20 MG capsule Take 1 capsule 2 x daily for Mood 180 capsule 1  . HYDROcodone-acetaminophen (NORCO/VICODIN) 5-325 MG per tablet Take 1 tablet by mouth every 6 (six) hours as needed for moderate pain. 30 tablet 0  . ibuprofen (ADVIL) 200 MG tablet Take 200 mg by mouth every 6 (six) hours as needed.      Marland Kitchen levothyroxine (SYNTHROID, LEVOTHROID) 75 MCG tablet TAKE 1 TABLET BY MOUTH ONCE DAILY 90 tablet PRN  . loratadine (CLARITIN) 10 MG tablet Take 10 mg by mouth as needed.      . Multiple Vitamin (MULTIVITAMIN) tablet Take 1 tablet by mouth daily.      Marland Kitchen omeprazole (PRILOSEC) 20 MG capsule Take 20 mg by mouth daily.      . Vitamin D, Ergocalciferol, (DRISDOL) 50000 UNITS CAPS capsule Take 1 capsule (50,000 Units total) by mouth 2 (two) times a week. 24 capsule 3   Current Facility-Administered Medications on File Prior to Visit  Medication Dose Route Frequency Provider Last Rate Last Dose  . 0.9 %  sodium chloride infusion  500 mL Intravenous Continuous Ladene Artist, MD        Current Problems (verified) Patient Active Problem List   Diagnosis Date Noted  . Vitamin D deficiency 01/15/2013  . PCO (polycystic ovaries)   . Anxiety   . Hyperlipidemia   . Thyroid disease   . Pre-diabetes   . Chronic back pain   . Acne     Screening Tests Immunization History  Administered Date(s) Administered  . DTaP 01/27/2003  . Influenza-Unspecified 10/26/2012  . Tdap 07/26/2013    Preventative care: Last colonoscopy: 2012 due 2022 Last mammogram: 06/2014 CAT B Last pap smear/pelvic exam: 09/2013 at GYN, negative DEXA:06/2013 MRI lumbar 2010 [place] AB 2012  Prior vaccinations: TD or Tdap: 2015  Influenza: 2015 at work Pneumococcal: N/A Prevnar13: N/A Shingles/Zostavax: N/A  Names of Other Physician/Practitioners you currently use: Patient Care Team: Unk Pinto, MD  as PCP - General (Internal Medicine) Ladene Artist, MD as Consulting Physician (Gastroenterology) Everlene Farrier, MD as Consulting Physician (Obstetrics and Gynecology) Emelia Loron, MD as Referring Physician (Neurosurgery)  SURGICAL HISTORY Past Surgical History  Procedure Laterality Date  . Synovial cyst excision  2010    l5, s1 left hip  . Brain meningioma excision  1965    at age 57  . Tonsillectomy    . Wisom teeth  1984  . Diagnostic laparoscopy    . Breast mass excision      left breast, benign   FAMILY HISTORY Family History  Problem Relation Age of Onset  . Dementia Mother   . Osteoporosis Mother   . Heart disease Father   . Parkinson's disease Father   . Stroke Father   . Diabetes Father   . Hyperlipidemia Father   . Hypertension Father   . Cancer Paternal Aunt     breast  . Cancer Maternal  Grandfather     brain   SOCIAL HISTORY History  Substance Use Topics  . Smoking status: Never Smoker   . Smokeless tobacco: Not on file  . Alcohol Use: Yes     Comment: occasional alcohol intake   Review of Systems  Constitutional: Negative.   HENT: Positive for congestion and hearing loss (left ear). Negative for ear discharge, ear pain, nosebleeds, sore throat and tinnitus.   Eyes: Negative.   Respiratory: Negative.  Negative for stridor.   Cardiovascular: Negative.   Gastrointestinal: Positive for constipation. Negative for heartburn, vomiting, abdominal pain, diarrhea, blood in stool (better with benefiber) and melena.  Genitourinary: Negative.   Musculoskeletal: Positive for back pain. Negative for myalgias, joint pain, falls and neck pain.  Skin: Negative.   Neurological: Negative.  Negative for headaches.  Endo/Heme/Allergies: Negative.   Psychiatric/Behavioral: Negative.      Objective:     Today's Vitals   08/07/14 0913  BP: 110/70  Pulse: 64  Temp: 97.7 F (36.5 C)  Resp: 16  Height: 5' 1.75" (1.568 m)  Weight: 147 lb (66.679 kg)   Body mass  index is 27.12 kg/(m^2).  General appearance: alert, no distress, WD/WN, female HEENT: normocephalic, sclerae anicteric, TMs pearly, nares patent, no discharge or erythema, pharynx normal Oral cavity: MMM, no lesions Neck: supple, no lymphadenopathy, no thyromegaly, no masses Heart: RRR, normal S1, S2, no murmurs Lungs: CTA bilaterally, no wheezes, rhonchi, or rales Abdomen: +bs, soft, non tender, non distended, no masses, no hepatomegaly, no splenomegaly Musculoskeletal: nontender, no swelling, no obvious deformity Extremities: no edema, no cyanosis, no clubbing Pulses: 2+ symmetric, upper and lower extremities, normal cap refill Neurological: alert, oriented x 3, CN2-12 intact, strength normal upper extremities and lower extremities, sensation normal throughout, DTRs 2+ throughout, no cerebellar signs, gait normal Psychiatric: normal affect, behavior normal, pleasant   Vicie Mutters, PA-C   08/07/2014

## 2014-08-08 LAB — HEMOGLOBIN A1C
Hgb A1c MFr Bld: 5.7 % — ABNORMAL HIGH (ref <5.7)
MEAN PLASMA GLUCOSE: 117 mg/dL — AB (ref <117)

## 2014-08-08 LAB — URINALYSIS, ROUTINE W REFLEX MICROSCOPIC
Bilirubin Urine: NEGATIVE
Glucose, UA: NEGATIVE mg/dL
Hgb urine dipstick: NEGATIVE
KETONES UR: NEGATIVE mg/dL
Leukocytes, UA: NEGATIVE
Nitrite: NEGATIVE
PH: 6 (ref 5.0–8.0)
Protein, ur: NEGATIVE mg/dL
SPECIFIC GRAVITY, URINE: 1.016 (ref 1.005–1.030)
Urobilinogen, UA: 0.2 mg/dL (ref 0.0–1.0)

## 2014-08-08 LAB — MICROALBUMIN / CREATININE URINE RATIO
CREATININE, URINE: 63.6 mg/dL
Microalb, Ur: <0.2 mg/dL (ref <2.0)

## 2014-08-08 LAB — HIV ANTIBODY (ROUTINE TESTING W REFLEX): HIV: NONREACTIVE

## 2014-08-08 LAB — VITAMIN B12: Vitamin B-12: 887 pg/mL (ref 211–911)

## 2014-08-08 LAB — TSH: TSH: 1.796 u[IU]/mL (ref 0.350–4.500)

## 2014-08-08 LAB — INSULIN, FASTING: INSULIN FASTING, SERUM: 11.4 u[IU]/mL (ref 2.0–19.6)

## 2014-08-08 LAB — VITAMIN D 25 HYDROXY (VIT D DEFICIENCY, FRACTURES): Vit D, 25-Hydroxy: 63 ng/mL (ref 30–100)

## 2014-08-08 LAB — FERRITIN: Ferritin: 64 ng/mL (ref 10–291)

## 2014-08-09 LAB — HEPATIC FUNCTION PANEL
ALT: 32 U/L (ref 0–35)
AST: 33 U/L (ref 0–37)
Albumin: 4.7 g/dL (ref 3.5–5.2)
Alkaline Phosphatase: 40 U/L (ref 39–117)
BILIRUBIN DIRECT: 0.1 mg/dL (ref 0.0–0.3)
Indirect Bilirubin: 0.3 mg/dL (ref 0.2–1.2)
Total Bilirubin: 0.4 mg/dL (ref 0.2–1.2)
Total Protein: 6.8 g/dL (ref 6.0–8.3)

## 2014-08-09 LAB — LIPID PANEL
CHOLESTEROL: 203 mg/dL — AB (ref 0–200)
HDL: 76 mg/dL (ref >=46)
LDL Cholesterol: 113 mg/dL — ABNORMAL HIGH (ref 0–99)
TRIGLYCERIDES: 72 mg/dL (ref <150)
Total CHOL/HDL Ratio: 2.7 Ratio
VLDL: 14 mg/dL (ref 0–40)

## 2014-08-09 LAB — BASIC METABOLIC PANEL WITH GFR
BUN: 12 mg/dL (ref 6–23)
CO2: 28 mEq/L (ref 19–32)
CREATININE: 0.78 mg/dL (ref 0.50–1.10)
Calcium: 10.1 mg/dL (ref 8.4–10.5)
Chloride: 100 mEq/L (ref 96–112)
GFR, Est African American: >89 mL/min
GFR, Est Non African American: 86 mL/min
Glucose, Bld: 100 mg/dL — ABNORMAL HIGH (ref 70–99)
POTASSIUM: 4.3 meq/L (ref 3.5–5.3)
SODIUM: 137 meq/L (ref 135–145)

## 2014-08-09 LAB — MAGNESIUM: Magnesium: 2.1 mg/dL (ref 1.5–2.5)

## 2014-08-09 LAB — IRON AND TIBC
%SAT: 26 % (ref 20–55)
Iron: 123 ug/dL (ref 42–145)
TIBC: 476 ug/dL — ABNORMAL HIGH (ref 250–470)
UIBC: 353 ug/dL (ref 125–400)

## 2014-09-17 ENCOUNTER — Encounter: Payer: Self-pay | Admitting: Physician Assistant

## 2014-09-17 NOTE — Progress Notes (Signed)
Entered chart in error. Was trying to view patient chart and patient's name was listed on it. Entered incorrect name.

## 2014-10-23 ENCOUNTER — Other Ambulatory Visit: Payer: Self-pay | Admitting: Obstetrics and Gynecology

## 2014-10-25 LAB — CYTOLOGY - PAP

## 2014-12-10 ENCOUNTER — Other Ambulatory Visit: Payer: Self-pay | Admitting: Internal Medicine

## 2014-12-10 DIAGNOSIS — E039 Hypothyroidism, unspecified: Secondary | ICD-10-CM

## 2015-02-14 ENCOUNTER — Ambulatory Visit (INDEPENDENT_AMBULATORY_CARE_PROVIDER_SITE_OTHER): Payer: 59 | Admitting: Physician Assistant

## 2015-02-14 ENCOUNTER — Other Ambulatory Visit: Payer: Self-pay

## 2015-02-14 VITALS — BP 110/74 | HR 70 | Temp 97.9°F | Ht 61.75 in | Wt 149.0 lb

## 2015-02-14 DIAGNOSIS — E785 Hyperlipidemia, unspecified: Secondary | ICD-10-CM

## 2015-02-14 DIAGNOSIS — E559 Vitamin D deficiency, unspecified: Secondary | ICD-10-CM

## 2015-02-14 DIAGNOSIS — E039 Hypothyroidism, unspecified: Secondary | ICD-10-CM

## 2015-02-14 DIAGNOSIS — E079 Disorder of thyroid, unspecified: Secondary | ICD-10-CM

## 2015-02-14 DIAGNOSIS — Z79899 Other long term (current) drug therapy: Secondary | ICD-10-CM

## 2015-02-14 DIAGNOSIS — R7309 Other abnormal glucose: Secondary | ICD-10-CM | POA: Diagnosis not present

## 2015-02-14 DIAGNOSIS — R7303 Prediabetes: Secondary | ICD-10-CM | POA: Diagnosis not present

## 2015-02-14 DIAGNOSIS — F419 Anxiety disorder, unspecified: Secondary | ICD-10-CM

## 2015-02-14 LAB — BASIC METABOLIC PANEL WITH GFR
BUN: 8 mg/dL (ref 7–25)
CHLORIDE: 103 mmol/L (ref 98–110)
CO2: 27 mmol/L (ref 20–31)
Calcium: 9.9 mg/dL (ref 8.6–10.4)
Creat: 0.78 mg/dL (ref 0.50–1.05)
GFR, EST NON AFRICAN AMERICAN: 86 mL/min (ref >=60)
GFR, Est African American: >89 mL/min (ref >=60)
GLUCOSE: 86 mg/dL (ref 65–99)
POTASSIUM: 4.1 mmol/L (ref 3.5–5.3)
Sodium: 139 mmol/L (ref 135–146)

## 2015-02-14 LAB — LIPID PANEL
Cholesterol: 172 mg/dL (ref 125–200)
HDL: 71 mg/dL (ref >=46)
LDL CALC: 87 mg/dL (ref <130)
Total CHOL/HDL Ratio: 2.4 Ratio (ref <=5.0)
Triglycerides: 71 mg/dL (ref <150)
VLDL: 14 mg/dL (ref <30)

## 2015-02-14 LAB — CBC WITH DIFFERENTIAL/PLATELET
BASOS ABS: 0 10*3/uL (ref 0.0–0.1)
Basophils Relative: 1 % (ref 0–1)
Eosinophils Absolute: 0.1 10*3/uL (ref 0.0–0.7)
Eosinophils Relative: 3 % (ref 0–5)
HEMATOCRIT: 39 % (ref 36.0–46.0)
Hemoglobin: 13 g/dL (ref 12.0–15.0)
LYMPHS ABS: 1 10*3/uL (ref 0.7–4.0)
LYMPHS PCT: 37 % (ref 12–46)
MCH: 31.3 pg (ref 26.0–34.0)
MCHC: 33.3 g/dL (ref 30.0–36.0)
MCV: 93.8 fL (ref 78.0–100.0)
MPV: 9.1 fL (ref 8.6–12.4)
Monocytes Absolute: 0.3 10*3/uL (ref 0.1–1.0)
Monocytes Relative: 10 % (ref 3–12)
NEUTROS PCT: 49 % (ref 43–77)
Neutro Abs: 1.3 10*3/uL — ABNORMAL LOW (ref 1.7–7.7)
PLATELETS: 313 10*3/uL (ref 150–400)
RBC: 4.16 MIL/uL (ref 3.87–5.11)
RDW: 13.7 % (ref 11.5–15.5)
WBC: 2.6 10*3/uL — ABNORMAL LOW (ref 4.0–10.5)

## 2015-02-14 LAB — HEPATIC FUNCTION PANEL
ALBUMIN: 4.5 g/dL (ref 3.6–5.1)
ALK PHOS: 34 U/L (ref 33–130)
ALT: 28 U/L (ref 6–29)
AST: 30 U/L (ref 10–35)
BILIRUBIN INDIRECT: 0.3 mg/dL (ref 0.2–1.2)
BILIRUBIN TOTAL: 0.4 mg/dL (ref 0.2–1.2)
Bilirubin, Direct: 0.1 mg/dL (ref <=0.2)
Total Protein: 6.5 g/dL (ref 6.1–8.1)

## 2015-02-14 LAB — MAGNESIUM: Magnesium: 2.2 mg/dL (ref 1.5–2.5)

## 2015-02-14 MED ORDER — LEVOTHYROXINE SODIUM 75 MCG PO TABS: 75.0000 ug | ORAL_TABLET | Freq: Every day | ORAL | Status: DC

## 2015-02-14 MED ORDER — FLUOXETINE HCL 20 MG PO CAPS: ORAL_CAPSULE | ORAL | Status: DC

## 2015-02-14 MED ORDER — ALBUTEROL SULFATE HFA 108 (90 BASE) MCG/ACT IN AERS: 2.0000 | INHALATION_SPRAY | Freq: Four times a day (QID) | RESPIRATORY_TRACT | Status: DC | PRN

## 2015-02-14 MED FILL — FLUoxetine HCL 20 MG CAPS: 20 | 90 days supply | Qty: 180 | Fill #0

## 2015-02-14 MED FILL — VENTOLIN HFA 90 MCG INHALER: 108 (90 BAS | 25 days supply | Qty: 18 | Fill #0

## 2015-02-14 NOTE — Progress Notes (Signed)
Assessment and Plan:  1. Hypertension -Continue medication, monitor blood pressure at home. Continue DASH diet.  Reminder to go to the ER if any CP, SOB, nausea, dizziness, severe HA, changes vision/speech, left arm numbness and tingling and jaw pain.  2. Cholesterol -Continue diet and exercise. Check cholesterol.   3. Prediabetes  -Continue diet and exercise. Check A1C  4. Vitamin D Def - check level and continue medications, 50,000 twice a week.   5. Constipation Better with benefiber.   6. Hypothyroidism -check TSH level, continue medications the same, reminded to take on an empty stomach 30-68mins before food.    Continue diet and meds as discussed. Further disposition pending results of labs. Over 30 minutes of exam, counseling, chart review, and critical decision making was performed  Future Appointments Date Time Provider [place]  08/07/2015 9:00 AM Vicie Mutters, PA-C [facility] None     HPI 55 y.o. female  presents for 3 month follow up on hypertension, cholesterol, prediabetes, and vitamin D deficiency.   Her blood pressure has been controlled at home, today their BP is BP: 110/74 mmHg  She does workout. She denies chest pain, shortness of breath, dizziness.  She is back on cholesterol medication and vytorin daily and fenofibrate 3 x a week, and added FO denies myalgias.   Her cholesterol is not at goal. The cholesterol last visit was:   Lab Results  Component Value Date   CHOL 203* 08/07/2014   HDL 76 08/07/2014   LDLCALC 113* 08/07/2014   TRIG 72 08/07/2014   CHOLHDL 2.7 08/07/2014    She has been working on diet and exercise for prediabetes (A1C 5.9 on 07/26/2013), and denies paresthesia of the feet, polydipsia, polyuria and visual disturbances. Last A1C in the office was:  Lab Results  Component Value Date   HGBA1C 5.7* 08/07/2014  Patient is on Vitamin D supplement.   Lab Results  Component Value Date   VD25OH 49 08/07/2014   She is on  thyroid medication. Her medication was not changed last visit.   Lab Results  Component Value Date   TSH 1.796 08/07/2014   She has depression/anxiety and is on prozac.      Current Medications:  Current Outpatient Prescriptions on File Prior to Visit  Medication Sig Dispense Refill  . ALPRAZolam (XANAX) 0.25 MG tablet Take 1 tablet (0.25 mg total) by mouth 3 (three) times daily. 90 tablet 1  . Bioflavonoid Products (PERIDIN-C PO) Take 200 mg by mouth. Peridin c 2 tabs three times a day for hot flashes     . cephALEXin (KEFLEX) 500 MG capsule TAKE 1 CAPSULE BY MOUTH TWICE A DAY 180 capsule 1  . Coenzyme Q10 (CO Q 10 PO) Take 200 mg by mouth daily.      Marland Kitchen ezetimibe-simvastatin (VYTORIN) 10-40 MG per tablet Take 1 tablet by mouth at bedtime. 30 tablet 6  . fenofibrate micronized (LOFIBRA) 134 MG capsule TAKE 1 CAPSULE BY MOUTH ONCE DAILY 90 capsule 1  . HYDROcodone-acetaminophen (NORCO/VICODIN) 5-325 MG per tablet Take 1 tablet by mouth every 6 (six) hours as needed for moderate pain. 30 tablet 0  . ibuprofen (ADVIL) 200 MG tablet Take 200 mg by mouth every 6 (six) hours as needed.      . loratadine (CLARITIN) 10 MG tablet Take 10 mg by mouth as needed.      . mometasone (NASONEX) 50 MCG/ACT nasal spray Place 2 sprays into the nose daily. 17 g 2  . Multiple Vitamin (MULTIVITAMIN)  tablet Take 1 tablet by mouth daily.      Marland Kitchen omeprazole (PRILOSEC) 20 MG capsule Take 20 mg by mouth daily.      . Vitamin D, Ergocalciferol, (DRISDOL) 50000 UNITS CAPS capsule Take 1 capsule (50,000 Units total) by mouth 2 (two) times a week. 24 capsule 3  . FLUoxetine (PROZAC) 20 MG capsule Take 1 capsule 2 x daily for Mood 180 capsule 1   Current Facility-Administered Medications on File Prior to Visit  Medication Dose Route Frequency Provider Last Rate Last Dose  . 0.9 %  sodium chloride infusion  500 mL Intravenous Continuous Ladene Artist, MD       Medical History:  Past Medical History  Diagnosis Date   . PCO (polycystic ovaries)   . Anxiety   . Arthritis   . Depression   . GERD (gastroesophageal reflux disease)   . Hyperlipidemia   . Hypothyroidism, adult     hypothyroidism  . Pre-diabetes   . Chronic back pain   . Post-menopausal   . Plantar fasciitis   . Acne   . Cervical polyp    Allergies:  Allergies  Allergen Reactions  . Advicor [Niacin-Lovastatin Er] Swelling    Facial swelling  . Niacin-Lovastatin Er Swelling and Other (See Comments)    Extreme flushing     Review of Systems:  Review of Systems  Constitutional: Negative.   HENT: Negative.   Eyes: Negative.   Respiratory: Negative.   Cardiovascular: Negative.   Gastrointestinal: Positive for constipation. Negative for heartburn, vomiting, abdominal pain, diarrhea, blood in stool and melena.  Genitourinary: Negative.   Musculoskeletal: Positive for back pain. Negative for myalgias, joint pain, falls and neck pain.  Skin: Negative.   Neurological: Negative.   Endo/Heme/Allergies: Negative.   Psychiatric/Behavioral: Negative.     Family history- Review and unchanged Social history- Review and unchanged Physical Exam: BP 110/74 mmHg  Pulse 70  Temp(Src) 97.9 F (36.6 C) (Temporal)  Ht 5' 1.75" (1.568 m)  Wt 149 lb (67.586 kg)  BMI 27.49 kg/m2  SpO2 99% Wt Readings from Last 3 Encounters:  02/14/15 149 lb (67.586 kg)  08/07/14 147 lb (66.679 kg)  04/18/14 145 lb (65.772 kg)   General Appearance: Well nourished, in no apparent distress. Eyes: PERRLA, EOMs, conjunctiva no swelling or erythema Sinuses: No Frontal/maxillary tenderness ENT/Mouth: Ext aud canals clear, TMs without erythema, bulging. No erythema, swelling, or exudate on post pharynx.  Tonsils not swollen or erythematous. Hearing normal.  Neck: Supple, thyroid normal.  Respiratory: Respiratory effort normal, BS equal bilaterally without rales, rhonchi, wheezing or stridor.  Cardio: RRR with no MRGs. Brisk peripheral pulses without edema.   Abdomen: Soft, + BS,  Non tender, no guarding, rebound, hernias, masses. Lymphatics: Non tender without lymphadenopathy.  Musculoskeletal: Full ROM, 5/5 strength, Normal gait Skin: Warm, dry without rashes, lesions, ecchymosis.  Neuro: Cranial nerves intact. Normal muscle tone, no cerebellar symptoms. Psych: Awake and oriented X 3, normal affect, Insight and Judgment appropriate.    Vicie Mutters, PA-C 9:57 AM [facility]

## 2015-02-14 NOTE — Patient Instructions (Signed)
Vitamin D goal is between 60-80  Please make sure that you are taking your Vitamin D as directed.   It is very important as a natural anti-inflammatory   helping hair, skin, and nails, as well as reducing stroke and heart attack risk.   It helps your bones and helps with mood.  It also decreases numerous cancer risks so please take it as directed.   Low Vit D is associated with a 200-300% higher risk for CANCER   and 200-300% higher risk for HEART   ATTACK  &  STROKE.    .....................................Marland Kitchen  It is also associated with higher death rate at younger ages,   autoimmune diseases like Rheumatoid arthritis, Lupus, Multiple Sclerosis.     Also many other serious conditions, like depression, Alzheimer's  Dementia, infertility, muscle aches, fatigue, fibromyalgia - just to name a few.  +++++++++++++++++++   Add ENTERIC COATED low dose 81 mg Aspirin daily OR can do every other day if you have easy bruising to protect your heart and head. As well as to reduce risk of Colon Cancer by 20 %, Skin Cancer by 26 % , Melanoma by 46% and Pancreatic cancer by 60%   Varicose Veins Varicose veins are veins that have become enlarged and twisted. CAUSES This condition is the result of valves in the veins not working properly. Valves in the veins help return blood from the leg to the heart. When your calf muscles squeeze, the blood moves up your leg then the valves close and this continues until the blood gets back to your heart.  If these valves are damaged, blood flows backwards and backs up into the veins in the leg near the skin OR if your are sitting/standing for a long time without using your calf muscles the blood will back up into the veins in your legs. This causes the veins to become larger. People who are on their feet a lot, sit a lot without walking (like on a plane, at a desk, or in a car), who are pregnant, or who are overweight are more likely to develop varicose  veins. SYMPTOMS   Bulging, twisted-appearing, bluish veins, most commonly found on the legs.  Leg pain or a feeling of heaviness. These symptoms may be worse at the end of the day.  Leg swelling.  Skin color changes. DIAGNOSIS  Varicose veins can usually be diagnosed with an exam of your legs by your caregiver. He or she may recommend an ultrasound of your leg veins. TREATMENT  Most varicose veins can be treated at home.However, other treatments are available for people who have persistent symptoms or who want to treat the cosmetic appearance of the varicose veins. But this is only cosmetic and they will return if not properly treated. These include:  Laser treatment of very small varicose veins.  Medicine that is shot (injected) into the vein. This medicine hardens the walls of the vein and closes off the vein. This treatment is called sclerotherapy. Afterwards, you may need to wear clothing or bandages that apply pressure.  Surgery. HOME CARE INSTRUCTIONS   Do not stand or sit in one position for long periods of time. Do not sit with your legs crossed. Rest with your legs raised during the day.  Your legs have to be higher than your heart so that gravity will force the valves to open, so please really elevate your legs.   Wear elastic stockings or support hose. Do not wear other tight, encircling garments around  the legs, pelvis, or waist.  ELAST[employer] THERAP[employer]  has a wide variety of well priced compression stockings. [place], [place] [id number] [id number] [place] ARE COPPER INFUSED COMPRESSION SOCKS AT [facility] OR [facility]  Walk as much as possible to increase blood flow.  Raise the foot of your bed at night with 2-inch blocks.  If you get a cut in the skin over the vein and the vein bleeds, lie down with your leg raised and press on it with a clean cloth until the bleeding stops. Then place a bandage (dressing) on the cut. See your caregiver if it continues to bleed  or needs stitches. SEEK MEDICAL CARE IF:   The skin around your ankle starts to break down.  You have pain, redness, tenderness, or hard swelling developing in your leg over a vein.  You are uncomfortable due to leg pain. Document Released: 10/22/2004 Document Revised: 04/06/2011 Document Reviewed: 03/10/2010 [facility] Patient Information 2014 [place].

## 2015-02-15 LAB — HEMOGLOBIN A1C
HEMOGLOBIN A1C: 5.9 % — AB (ref <5.7)
Mean Plasma Glucose: 123 mg/dL — ABNORMAL HIGH (ref <117)

## 2015-02-15 LAB — VITAMIN D 25 HYDROXY (VIT D DEFICIENCY, FRACTURES): VIT D 25 HYDROXY: 72 ng/mL (ref 30–100)

## 2015-02-15 LAB — TSH: TSH: 2.693 u[IU]/mL (ref 0.350–4.500)

## 2015-02-26 MED FILL — VYTORIN 10-40 MG TABLET: 10-40 | 90 days supply | Qty: 90 | Fill #1

## 2015-03-07 ENCOUNTER — Other Ambulatory Visit: Payer: Self-pay | Admitting: Physician Assistant

## 2015-03-07 MED FILL — FENOFIBRATE 134 MG CAPSULE: 134 | 90 days supply | Qty: 90 | Fill #0

## 2015-03-07 MED FILL — LEVOTHYROXINE 75 MCG TABLET: 75 | 90 days supply | Qty: 90 | Fill #0

## 2015-03-08 ENCOUNTER — Other Ambulatory Visit: Payer: Self-pay

## 2015-03-08 MED ORDER — OMEPRAZOLE 40 MG PO CPDR: 40.0000 mg | DELAYED_RELEASE_CAPSULE | Freq: Every day | ORAL | Status: DC

## 2015-03-08 MED FILL — OMEPRAZOLE DR 40 MG CAPSULE: 40 | 90 days supply | Qty: 90 | Fill #0

## 2015-03-13 MED FILL — VIT D2 1.25 MG (50,000 UNIT: 1.25 MG | 84 days supply | Qty: 24 | Fill #3

## 2015-04-12 MED FILL — CEPHALEXIN 500 MG CAPSULE: 500 | 90 days supply | Qty: 180 | Fill #1

## 2015-04-17 ENCOUNTER — Encounter: Payer: Self-pay | Admitting: Internal Medicine

## 2015-04-17 ENCOUNTER — Ambulatory Visit (INDEPENDENT_AMBULATORY_CARE_PROVIDER_SITE_OTHER): Payer: 59 | Admitting: Internal Medicine

## 2015-04-17 VITALS — HR 77 | Temp 97.9°F | Ht 61.75 in

## 2015-04-17 DIAGNOSIS — J069 Acute upper respiratory infection, unspecified: Secondary | ICD-10-CM

## 2015-04-17 MED ORDER — RANITIDINE HCL 300 MG PO TABS: 300.0000 mg | ORAL_TABLET | Freq: Every day | ORAL | Status: DC

## 2015-04-17 MED ORDER — PROMETHAZINE-DM 6.25-15 MG/5ML PO SYRP: ORAL_SOLUTION | ORAL | Status: DC

## 2015-04-17 MED ORDER — AZITHROMYCIN 250 MG PO TABS: ORAL_TABLET | ORAL | Status: DC

## 2015-04-17 MED ORDER — PREDNISONE 20 MG PO TABS: ORAL_TABLET | ORAL | Status: DC

## 2015-04-17 MED ORDER — IPRATROPIUM-ALBUTEROL 0.5-2.5 (3) MG/3ML IN SOLN
3.0000 mL | Freq: Once | RESPIRATORY_TRACT | Status: AC
  Administered 2015-04-17: 3 mL via RESPIRATORY_TRACT

## 2015-04-17 MED FILL — PROMETHAZINE-DM SYRUP: 6.25-15 | 12 days supply | Qty: 360 | Fill #0

## 2015-04-17 MED FILL — AZITHROMYCIN 250 MG TABLET: 250 | 5 days supply | Qty: 6 | Fill #0

## 2015-04-17 MED FILL — raNITIdine HCL 300 MG TABS: 300 | 30 days supply | Qty: 30 | Fill #0

## 2015-04-17 MED FILL — predniSONE 20 MG TABS: 20 | 15 days supply | Qty: 27 | Fill #0

## 2015-04-17 NOTE — Progress Notes (Signed)
HPI  Patient presents to the office for evaluation of cough, sore throat and nasal congestion.  It has been going on for 3 days.  Patient reports night > day, dry, barky, worse with lying down, sometimes wet nasal congestion.  They also endorse change in voice, chills, fever, postnasal drip and nasal congestion, sinus pressure, bilateral ear pain. .  They have tried tylenol and aleve.  They report that nothing has worked.  They admits to other sick contacts.  Review of Systems  Constitutional: Negative for fever, chills and malaise/fatigue.  HENT: Positive for congestion, ear pain and sore throat.   Eyes: Negative.   Respiratory: Positive for cough and wheezing. Negative for shortness of breath.   Cardiovascular: Negative for chest pain, palpitations and leg swelling.  Gastrointestinal: Negative for heartburn, nausea and vomiting.  Genitourinary: Negative.   Skin: Negative.   Neurological: Positive for headaches.    PE:  Filed Vitals:   04/17/15 1045  Pulse: 77  Temp: 97.9 F (36.6 C)    General:  Alert and non-toxic, WDWN, NAD HEENT: NCAT, PERLA, EOM normal, no occular discharge or erythema.  Nasal mucosal edema with sinus tenderness to palpation.  Oropharynx clear with minimal oropharyngeal edema and erythema.  Mucous membranes moist and pink. Neck:  Cervical adenopathy Chest:  RRR no MRGs.  Lungs clear to auscultation A&P with no wheezes rhonchi or rales.   Abdomen: +BS x 4 quadrants, soft, non-tender, no guarding, rigidity, or rebound. Skin: warm and dry no rash Neuro: A&Ox4, CN II-XII grossly intact  Assessment and Plan:   1. Acute URI  - azithromycin (ZITHROMAX Z-PAK) 250 MG tablet; 2 po day one, then 1 daily x 4 days  Dispense: 6 tablet; Refill: 0 - predniSONE (DELTASONE) 20 MG tablet; 3 tabs po daily x 3 days, then 2 tabs x 3 days, then 1.5 tabs x 3 days, then 1 tab x 3 days, then 0.5 tabs x 3 days  Dispense: 27 tablet; Refill: 0 - promethazine-dextromethorphan  (PROMETHAZINE-DM) 6.25-15 MG/5ML syrup; Take 5-10 mL PO q8hrs prn for cold symptoms  Dispense: 360 mL; Refill: 1 - ranitidine (ZANTAC) 300 MG tablet; Take 1 tablet (300 mg total) by mouth at bedtime.  Dispense: 30 tablet; Refill: 1 - ipratropium-albuterol (DUONEB) 0.5-2.5 (3) MG/3ML nebulizer solution 3 mL; Take 3 mLs by nebulization once.

## 2015-04-19 MED FILL — MOMETASONE FUROATE 50 MCG S: 50 | 30 days supply | Qty: 17 | Fill #2

## 2015-04-22 ENCOUNTER — Ambulatory Visit (INDEPENDENT_AMBULATORY_CARE_PROVIDER_SITE_OTHER): Payer: 59 | Admitting: Internal Medicine

## 2015-04-22 ENCOUNTER — Encounter: Payer: Self-pay | Admitting: Internal Medicine

## 2015-04-22 VITALS — BP 142/86 | HR 66 | Temp 98.0°F | Resp 18 | Ht 61.75 in

## 2015-04-22 DIAGNOSIS — J209 Acute bronchitis, unspecified: Secondary | ICD-10-CM

## 2015-04-22 MED ORDER — BENZONATATE 200 MG PO CAPS: 200.0000 mg | ORAL_CAPSULE | Freq: Three times a day (TID) | ORAL | Status: DC | PRN

## 2015-04-22 MED ORDER — AMOXICILLIN-POT CLAVULANATE 875-125 MG PO TABS: 1.0000 | ORAL_TABLET | Freq: Two times a day (BID) | ORAL | Status: DC

## 2015-04-22 MED FILL — AMOX-CLAV 875-125 MG TABLET: 875-125 | 7 days supply | Qty: 14 | Fill #0

## 2015-04-22 MED FILL — BENZONATATE 200 MG CAPSULE: 200 | 20 days supply | Qty: 60 | Fill #0

## 2015-04-22 NOTE — Progress Notes (Signed)
Assessment and Plan:   1. Acute bronchitis, unspecified organism -no evidence of peritonsilar or periapical abscess visible.  Likely reactive lymphadenopathy in the left submandibular gland -continue breo until gone -cont phenergan dm -norel ad -augmentin  HPI 55 y.o.female presents for 1 week follow up of acute uri. Patient reports that they have been having some swelling under the left jaw and more pain in the left ear.  female is taking their medication.  They are not having difficulty with their medications.  They report no adverse reactions.  She has finished the zpak and she has also continued to take the prednisone.  She reports that she was able to eat and drink over the weekend.  She feels like her breathing status has gotten better since the last visit.    Past Medical History  Diagnosis Date  . PCO (polycystic ovaries)   . Anxiety   . Arthritis   . Depression   . GERD (gastroesophageal reflux disease)   . Hyperlipidemia   . Hypothyroidism, adult     hypothyroidism  . Pre-diabetes   . Chronic back pain   . Post-menopausal   . Plantar fasciitis   . Acne   . Cervical polyp      Allergies  Allergen Reactions  . Advicor [Niacin-Lovastatin Er] Swelling    Facial swelling  . Niacin-Lovastatin Er Swelling and Other (See Comments)    Extreme flushing      Current Outpatient Prescriptions on File Prior to Visit  Medication Sig Dispense Refill  . albuterol (PROVENTIL HFA;VENTOLIN HFA) 108 (90 Base) MCG/ACT inhaler Inhale 2 puffs into the lungs every 6 (six) hours as needed for wheezing or shortness of breath. 1 Inhaler 2  . ALPRAZolam (XANAX) 0.25 MG tablet Take 1 tablet (0.25 mg total) by mouth 3 (three) times daily. 90 tablet 1  . Bioflavonoid Products (PERIDIN-C PO) Take 200 mg by mouth. Peridin c 2 tabs three times a day for hot flashes     . cephALEXin (KEFLEX) 500 MG capsule TAKE 1 CAPSULE BY MOUTH TWICE A DAY 180 capsule 1  . Coenzyme Q10 (CO Q 10 PO) Take 200 mg  by mouth daily.      Marland Kitchen ezetimibe-simvastatin (VYTORIN) 10-40 MG per tablet Take 1 tablet by mouth at bedtime. 30 tablet 6  . fenofibrate micronized (LOFIBRA) 134 MG capsule TAKE 1 CAPSULE BY MOUTH ONCE DAILY 90 capsule 1  . FLUoxetine (PROZAC) 20 MG capsule Take 1 capsule 2 x daily for Mood 180 capsule 1  . HYDROcodone-acetaminophen (NORCO/VICODIN) 5-325 MG per tablet Take 1 tablet by mouth every 6 (six) hours as needed for moderate pain. 30 tablet 0  . ibuprofen (ADVIL) 200 MG tablet Take 200 mg by mouth every 6 (six) hours as needed.      Marland Kitchen levothyroxine (SYNTHROID, LEVOTHROID) 75 MCG tablet Take 1 tablet (75 mcg total) by mouth daily. 90 tablet 0  . loratadine (CLARITIN) 10 MG tablet Take 10 mg by mouth as needed.      . mometasone (NASONEX) 50 MCG/ACT nasal spray Place 2 sprays into the nose daily. 17 g 2  . Multiple Vitamin (MULTIVITAMIN) tablet Take 1 tablet by mouth daily.      Marland Kitchen omeprazole (PRILOSEC) 40 MG capsule Take 1 capsule (40 mg total) by mouth daily. 90 capsule 0  . predniSONE (DELTASONE) 20 MG tablet 3 tabs po daily x 3 days, then 2 tabs x 3 days, then 1.5 tabs x 3 days, then 1 tab x 3 days,  then 0.5 tabs x 3 days 27 tablet 0  . promethazine-dextromethorphan (PROMETHAZINE-DM) 6.25-15 MG/5ML syrup Take 5-10 mL PO q8hrs prn for cold symptoms 360 mL 1  . ranitidine (ZANTAC) 300 MG tablet Take 1 tablet (300 mg total) by mouth at bedtime. 30 tablet 1  . Vitamin D, Ergocalciferol, (DRISDOL) 50000 UNITS CAPS capsule Take 1 capsule (50,000 Units total) by mouth 2 (two) times a week. 24 capsule 3   Current Facility-Administered Medications on File Prior to Visit  Medication Dose Route Frequency Provider Last Rate Last Dose  . 0.9 %  sodium chloride infusion  500 mL Intravenous Continuous Ladene Artist, MD        ROS: all negative except above.   Physical Exam: Filed Weights   BP 142/86 mmHg  Pulse 66  Temp(Src) 98 F (36.7 C) (Temporal)  Resp 18  Ht 5' 1.75" (1.568 m)  Wt    SpO2 98% General Appearance: Well developed well nourished, non-toxic appearing in no apparent distress. Eyes: PERRLA, EOMs, conjunctiva w/ no swelling or erythema or discharge Sinuses: Left maxillary sinus tenderness ENT/Mouth: Ear canals clear without swelling or erythema.  TM's normal bilaterally with no retractions, bulging, or loss of landmarks.  No trismus, normal uvular rise Neck: Supple, thyroid normal, no notable JVD, palpable left submandibular gland Respiratory: Respiratory effort normal, Clear breath sounds anteriorly and posteriorly bilaterally without rales, rhonchi, wheezing or stridor. No retractions or accessory muscle usage. Cardio: RRR with no MRGs.   Abdomen: Soft, + BS.  Non tender, no guarding, rebound, hernias, masses.  Musculoskeletal: Full ROM, 5/5 strength, normal gait.  Skin: Warm, dry without rashes  Neuro: Awake and oriented X 3, Cranial nerves intact. Normal muscle tone, no cerebellar symptoms. Sensation intact.  Psych: normal affect, Insight and Judgment appropriate.     Starlyn Skeans, PA-C 11:53 AM [facility]

## 2015-04-30 ENCOUNTER — Encounter: Payer: Self-pay | Admitting: Internal Medicine

## 2015-04-30 ENCOUNTER — Ambulatory Visit (INDEPENDENT_AMBULATORY_CARE_PROVIDER_SITE_OTHER): Payer: 59 | Admitting: Internal Medicine

## 2015-04-30 VITALS — BP 124/70 | HR 64 | Temp 97.8°F | Resp 16 | Ht 61.75 in

## 2015-04-30 DIAGNOSIS — J209 Acute bronchitis, unspecified: Secondary | ICD-10-CM | POA: Diagnosis not present

## 2015-04-30 NOTE — Progress Notes (Signed)
Assessment and Plan:   1. Acute bronchitis, unspecified organism -improving -finish prednisone -continue breo -cont norel until it is gone  HPI 55 y.o.female presents for 1 week follow-up of acute uri with some improvement with abx and prednisone and other symptomatic treatment.  Patient reports that they have been doing well.  female is taking their medication.  They are not having difficulty with their medications.  They report no adverse reactions.  She is still on a 1/2 tablet of the prednisone and the cough is improving.  She is still taking the norel AD.  She is finished with the breo.  She reports that her energy level is increasing as well.     Past Medical History  Diagnosis Date  . PCO (polycystic ovaries)   . Anxiety   . Arthritis   . Depression   . GERD (gastroesophageal reflux disease)   . Hyperlipidemia   . Hypothyroidism, adult     hypothyroidism  . Pre-diabetes   . Chronic back pain   . Post-menopausal   . Plantar fasciitis   . Acne   . Cervical polyp      Allergies  Allergen Reactions  . Advicor [Niacin-Lovastatin Er] Swelling    Facial swelling  . Niacin-Lovastatin Er Swelling and Other (See Comments)    Extreme flushing      Current Outpatient Prescriptions on File Prior to Visit  Medication Sig Dispense Refill  . albuterol (PROVENTIL HFA;VENTOLIN HFA) 108 (90 Base) MCG/ACT inhaler Inhale 2 puffs into the lungs every 6 (six) hours as needed for wheezing or shortness of breath. 1 Inhaler 2  . ALPRAZolam (XANAX) 0.25 MG tablet Take 1 tablet (0.25 mg total) by mouth 3 (three) times daily. 90 tablet 1  . benzonatate (TESSALON) 200 MG capsule Take 1 capsule (200 mg total) by mouth 3 (three) times daily as needed for cough. 60 capsule 0  . Bioflavonoid Products (PERIDIN-C PO) Take 200 mg by mouth. Peridin c 2 tabs three times a day for hot flashes     . cephALEXin (KEFLEX) 500 MG capsule TAKE 1 CAPSULE BY MOUTH TWICE A DAY 180 capsule 1  . Coenzyme Q10 (CO Q  10 PO) Take 200 mg by mouth daily.      Marland Kitchen ezetimibe-simvastatin (VYTORIN) 10-40 MG per tablet Take 1 tablet by mouth at bedtime. 30 tablet 6  . fenofibrate micronized (LOFIBRA) 134 MG capsule TAKE 1 CAPSULE BY MOUTH ONCE DAILY 90 capsule 1  . FLUoxetine (PROZAC) 20 MG capsule Take 1 capsule 2 x daily for Mood 180 capsule 1  . HYDROcodone-acetaminophen (NORCO/VICODIN) 5-325 MG per tablet Take 1 tablet by mouth every 6 (six) hours as needed for moderate pain. 30 tablet 0  . ibuprofen (ADVIL) 200 MG tablet Take 200 mg by mouth every 6 (six) hours as needed.      Marland Kitchen levothyroxine (SYNTHROID, LEVOTHROID) 75 MCG tablet Take 1 tablet (75 mcg total) by mouth daily. 90 tablet 0  . loratadine (CLARITIN) 10 MG tablet Take 10 mg by mouth as needed.      . mometasone (NASONEX) 50 MCG/ACT nasal spray Place 2 sprays into the nose daily. 17 g 2  . Multiple Vitamin (MULTIVITAMIN) tablet Take 1 tablet by mouth daily.      Marland Kitchen omeprazole (PRILOSEC) 40 MG capsule Take 1 capsule (40 mg total) by mouth daily. 90 capsule 0  . predniSONE (DELTASONE) 20 MG tablet 3 tabs po daily x 3 days, then 2 tabs x 3 days, then 1.5 tabs  x 3 days, then 1 tab x 3 days, then 0.5 tabs x 3 days 27 tablet 0  . promethazine-dextromethorphan (PROMETHAZINE-DM) 6.25-15 MG/5ML syrup Take 5-10 mL PO q8hrs prn for cold symptoms 360 mL 1  . ranitidine (ZANTAC) 300 MG tablet Take 1 tablet (300 mg total) by mouth at bedtime. 30 tablet 1  . Vitamin D, Ergocalciferol, (DRISDOL) 50000 UNITS CAPS capsule Take 1 capsule (50,000 Units total) by mouth 2 (two) times a week. 24 capsule 3   Current Facility-Administered Medications on File Prior to Visit  Medication Dose Route Frequency Provider Last Rate Last Dose  . 0.9 %  sodium chloride infusion  500 mL Intravenous Continuous Ladene Artist, MD        ROS: all negative except above.   Physical Exam: Filed Weights   BP 124/70 mmHg  Pulse 64  Temp(Src) 97.8 F (36.6 C) (Temporal)  Resp 16  Ht 5'  1.75" (1.568 m)  Wt   SpO2 99% General Appearance: Well developed well nourished, non-toxic appearing in no apparent distress. Eyes: PERRLA, EOMs, conjunctiva w/ no swelling or erythema or discharge Sinuses: No Frontal/maxillary tenderness ENT/Mouth: Ear canals clear without swelling or erythema.  TM's normal bilaterally with no retractions, bulging, or loss of landmarks.   Neck: Supple, thyroid normal, no notable JVD  Respiratory: Respiratory effort normal, Clear breath sounds anteriorly and posteriorly bilaterally without rales, rhonchi, wheezing or stridor. No retractions or accessory muscle usage. Cardio: RRR with no MRGs.   Abdomen: Soft, + BS.  Non tender, no guarding, rebound, hernias, masses.  Musculoskeletal: Full ROM, 5/5 strength, normal gait.  Skin: Warm, dry without rashes  Neuro: Awake and oriented X 3, Cranial nerves intact. Normal muscle tone, no cerebellar symptoms. Sensation intact.  Psych: normal affect, Insight and Judgment appropriate.     Starlyn Skeans, PA-C 10:18 AM [facility]

## 2015-05-27 ENCOUNTER — Other Ambulatory Visit: Payer: Self-pay | Admitting: Physician Assistant

## 2015-05-29 MED FILL — LEVOTHYROXINE 75 MCG TABLET: 75 | 90 days supply | Qty: 90 | Fill #0

## 2015-06-11 ENCOUNTER — Other Ambulatory Visit: Payer: Self-pay

## 2015-06-11 ENCOUNTER — Other Ambulatory Visit: Payer: Self-pay | Admitting: Physician Assistant

## 2015-06-11 DIAGNOSIS — Z1231 Encounter for screening mammogram for malignant neoplasm of breast: Secondary | ICD-10-CM

## 2015-06-11 MED FILL — FLUoxetine HCL 20 MG CAPS: 20 | 90 days supply | Qty: 180 | Fill #1

## 2015-06-11 MED FILL — VIT D2 1.25 MG (50,000 UNIT: 1.25 MG | 84 days supply | Qty: 24 | Fill #0

## 2015-06-19 MED FILL — VYTORIN 10-40 MG TABLET: 10-40 | 30 days supply | Qty: 30 | Fill #2

## 2015-06-19 MED FILL — FENOFIBRATE 134 MG CAPSULE: 134 | 90 days supply | Qty: 90 | Fill #1

## 2015-07-08 ENCOUNTER — Other Ambulatory Visit: Payer: Self-pay | Admitting: Physician Assistant

## 2015-07-08 MED FILL — CEPHALEXIN 500 MG CAPSULE: 500 | 90 days supply | Qty: 180 | Fill #0

## 2015-07-17 ENCOUNTER — Ambulatory Visit
Admission: RE | Admit: 2015-07-17 | Discharge: 2015-07-17 | Disposition: A | Discharge status: Home / Self Care | Payer: 59 | Source: Ambulatory Visit

## 2015-07-17 DIAGNOSIS — Z1231 Encounter for screening mammogram for malignant neoplasm of breast: Secondary | ICD-10-CM

## 2015-07-28 MED FILL — raNITIdine HCL 300 MG TABS: 300 | 30 days supply | Qty: 30 | Fill #1

## 2015-08-07 ENCOUNTER — Encounter: Payer: Self-pay | Admitting: Physician Assistant

## 2015-08-13 ENCOUNTER — Other Ambulatory Visit: Payer: Self-pay | Admitting: Physician Assistant

## 2015-08-21 ENCOUNTER — Encounter: Payer: Self-pay | Admitting: Internal Medicine

## 2015-08-21 ENCOUNTER — Ambulatory Visit (INDEPENDENT_AMBULATORY_CARE_PROVIDER_SITE_OTHER): Payer: 59 | Admitting: Internal Medicine

## 2015-08-21 VITALS — BP 90/60 | HR 56 | Temp 98.0°F | Resp 16 | Ht 61.5 in | Wt 147.0 lb

## 2015-08-21 DIAGNOSIS — E785 Hyperlipidemia, unspecified: Secondary | ICD-10-CM

## 2015-08-21 DIAGNOSIS — Z0001 Encounter for general adult medical examination with abnormal findings: Secondary | ICD-10-CM

## 2015-08-21 DIAGNOSIS — Z0000 Encounter for general adult medical examination without abnormal findings: Secondary | ICD-10-CM

## 2015-08-21 DIAGNOSIS — R7303 Prediabetes: Secondary | ICD-10-CM | POA: Diagnosis not present

## 2015-08-21 DIAGNOSIS — Z1211 Encounter for screening for malignant neoplasm of colon: Secondary | ICD-10-CM

## 2015-08-21 DIAGNOSIS — E559 Vitamin D deficiency, unspecified: Secondary | ICD-10-CM

## 2015-08-21 DIAGNOSIS — E282 Polycystic ovarian syndrome: Secondary | ICD-10-CM

## 2015-08-21 DIAGNOSIS — E079 Disorder of thyroid, unspecified: Secondary | ICD-10-CM | POA: Diagnosis not present

## 2015-08-21 DIAGNOSIS — Z139 Encounter for screening, unspecified: Secondary | ICD-10-CM | POA: Diagnosis not present

## 2015-08-21 DIAGNOSIS — Z136 Encounter for screening for cardiovascular disorders: Secondary | ICD-10-CM

## 2015-08-21 DIAGNOSIS — Z1389 Encounter for screening for other disorder: Secondary | ICD-10-CM

## 2015-08-21 DIAGNOSIS — Z130 Encounter for screening for diseases of the blood and blood-forming organs and certain disorders involving the immune mechanism: Secondary | ICD-10-CM | POA: Diagnosis not present

## 2015-08-21 DIAGNOSIS — M549 Dorsalgia, unspecified: Secondary | ICD-10-CM

## 2015-08-21 DIAGNOSIS — I10 Essential (primary) hypertension: Secondary | ICD-10-CM | POA: Diagnosis not present

## 2015-08-21 DIAGNOSIS — L709 Acne, unspecified: Secondary | ICD-10-CM

## 2015-08-21 DIAGNOSIS — Z9109 Other allergy status, other than to drugs and biological substances: Secondary | ICD-10-CM

## 2015-08-21 DIAGNOSIS — J309 Allergic rhinitis, unspecified: Secondary | ICD-10-CM

## 2015-08-21 DIAGNOSIS — R6889 Other general symptoms and signs: Secondary | ICD-10-CM | POA: Diagnosis not present

## 2015-08-21 DIAGNOSIS — G8929 Other chronic pain: Secondary | ICD-10-CM

## 2015-08-21 LAB — CBC WITH DIFFERENTIAL/PLATELET
BASOS ABS: 28 {cells}/uL (ref 0–200)
Basophils Relative: 1 %
EOS ABS: 140 {cells}/uL (ref 15–500)
EOS PCT: 5 %
HEMATOCRIT: 39.9 % (ref 35.0–45.0)
HEMOGLOBIN: 13.1 g/dL (ref 11.7–15.5)
LYMPHS ABS: 1176 {cells}/uL (ref 850–3900)
Lymphocytes Relative: 42 %
MCH: 30.9 pg (ref 27.0–33.0)
MCHC: 32.8 g/dL (ref 32.0–36.0)
MCV: 94.1 fL (ref 80.0–100.0)
MONO ABS: 308 {cells}/uL (ref 200–950)
MPV: 9.4 fL (ref 7.5–12.5)
Monocytes Relative: 11 %
NEUTROS ABS: 1148 {cells}/uL — AB (ref 1500–7800)
NEUTROS PCT: 41 %
Platelets: 297 10*3/uL (ref 140–400)
RBC: 4.24 MIL/uL (ref 3.80–5.10)
RDW: 13.2 % (ref 11.0–15.0)
WBC: 2.8 10*3/uL — ABNORMAL LOW (ref 3.8–10.8)

## 2015-08-21 LAB — BASIC METABOLIC PANEL WITH GFR
BUN: 8 mg/dL (ref 7–25)
CHLORIDE: 104 mmol/L (ref 98–110)
CO2: 27 mmol/L (ref 20–31)
CREATININE: 0.82 mg/dL (ref 0.50–1.05)
Calcium: 9.8 mg/dL (ref 8.6–10.4)
GFR, Est African American: >89 mL/min (ref >=60)
GFR, Est Non African American: 81 mL/min (ref >=60)
GLUCOSE: 89 mg/dL (ref 65–99)
Potassium: 4.3 mmol/L (ref 3.5–5.3)
SODIUM: 141 mmol/L (ref 135–146)

## 2015-08-21 LAB — LIPID PANEL
CHOL/HDL RATIO: 2.5 ratio (ref <=5.0)
Cholesterol: 178 mg/dL (ref 125–200)
HDL: 71 mg/dL (ref >=46)
LDL Cholesterol: 88 mg/dL (ref <130)
Triglycerides: 94 mg/dL (ref <150)
VLDL: 19 mg/dL (ref <30)

## 2015-08-21 LAB — HEPATIC FUNCTION PANEL
ALT: 25 U/L (ref 6–29)
AST: 29 U/L (ref 10–35)
Albumin: 4.7 g/dL (ref 3.6–5.1)
Alkaline Phosphatase: 37 U/L (ref 33–130)
BILIRUBIN DIRECT: 0.1 mg/dL (ref <=0.2)
BILIRUBIN INDIRECT: 0.2 mg/dL (ref 0.2–1.2)
TOTAL PROTEIN: 6.8 g/dL (ref 6.1–8.1)
Total Bilirubin: 0.3 mg/dL (ref 0.2–1.2)

## 2015-08-21 LAB — MAGNESIUM: MAGNESIUM: 2.2 mg/dL (ref 1.5–2.5)

## 2015-08-21 LAB — VITAMIN B12: Vitamin B-12: 769 pg/mL (ref 200–1100)

## 2015-08-21 LAB — HEMOGLOBIN A1C
HEMOGLOBIN A1C: 5.7 % — AB (ref <5.7)
MEAN PLASMA GLUCOSE: 117 mg/dL

## 2015-08-21 LAB — IRON AND TIBC
%SAT: 21 % (ref 11–50)
IRON: 99 ug/dL (ref 45–160)
TIBC: 465 ug/dL — AB (ref 250–450)
UIBC: 366 ug/dL (ref 125–400)

## 2015-08-21 LAB — TSH: TSH: 0.42 m[IU]/L

## 2015-08-21 MED ORDER — MOMETASONE FUROATE 50 MCG/ACT NA SUSP: 2.0000 | Freq: Every day | NASAL | 2 refills | Status: DC

## 2015-08-21 MED ORDER — EZETIMIBE-SIMVASTATIN 10-40 MG PO TABS: 1.0000 | ORAL_TABLET | Freq: Every day | ORAL | 6 refills | Status: DC

## 2015-08-21 MED FILL — MOMETASONE FUROATE 50 MCG S: 50 | 30 days supply | Qty: 17 | Fill #0

## 2015-08-21 MED FILL — LEVOTHYROXINE 75 MCG TABLET: 75 | 90 days supply | Qty: 90 | Fill #1

## 2015-08-21 MED FILL — EZETIMIBE-SIMVASTATIN 10-40: 10-40 | 90 days supply | Qty: 90 | Fill #0

## 2015-08-21 NOTE — Progress Notes (Signed)
Patient ID: Alison Alvarez, female   DOB: 1960/12/03, 55 y.o.   MRN: [id number]  Complete Physical  Assessment and Plan:   1. Encounter for general adult medical examination with abnormal findings  - CBC with Differential/Platelet - BASIC METABOLIC PANEL WITH GFR - Hepatic function panel - Magnesium  2. PCO (polycystic ovaries) -followed by Obgyn  3. Thyroid disease -cont levothyroxine - TSH  4. Hyperlipidemia -cont vytorin - Lipid panel  5. Pre-diabetes  - Hemoglobin A1c - Insulin, random  6. Vitamin D deficiency -cont supplement - VITAMIN D 25 Hydroxy (Vit-D Deficiency, Fractures)  7. Chronic back pain -followed by pain management  8. Environmental allergies -cont nasonex  9. Acne, unspecified acne type -on oral keflex  10. Allergic rhinitis, unspecified allergic rhinitis type -cont nasonex -cont alternating antihistamines  11. Screening for deficiency anemia  - Iron and TIBC - Vitamin B12  12. Screening for hematuria or proteinuria  - Urinalysis, Routine w reflex microscopic (not at [facility]) - Microalbumin / creatinine urine ratio  13. Encounter for Hemoccult screening  - POC Hemoccult Bld/Stl (3-Cd Home Screen); Future  14. Screening for cardiovascular condition  - EKG 12-Lead - [place], RETROPERITNL ABD,  LTD   Discussed med's effects and SE's. Screening labs and tests as requested with regular follow-up as recommended.  HPI  55 y.o. female  presents for a complete physical.  Her blood pressure has been controlled at home, today their BP is BP: 90/60.  She does workout. She denies chest pain, shortness of breath, dizziness.   She is on cholesterol medication and denies myalgias. Her cholesterol is at goal. The cholesterol last visit was:  Lab Results  Component Value Date   CHOL 172 02/14/2015   HDL 71 02/14/2015   LDLCALC 87 02/14/2015   TRIG 71 02/14/2015   CHOLHDL 2.4 02/14/2015  .  She has been working on diet and exercise for  prediabetes, she is on bASA, she is on ACE/ARB and denies foot ulcerations, hyperglycemia, hypoglycemia , increased appetite, nausea, paresthesia of the feet, polydipsia, polyuria, visual disturbances, vomiting and weight loss. Last A1C in the office was:  Lab Results  Component Value Date   HGBA1C 5.9 (H) 02/14/2015    Patient is on Vitamin D supplement.   Lab Results  Component Value Date   VD25OH 72 02/14/2015     Patient reports that she does find that she sometimes has a hard time swallowing hard foods and makes her have some really bad coughing spells.  She reports that she does have a hiatal hernia.  She reports that she has been using prilosec and has also been using zantac in between.  She reports that she still has some issues with coughing.  She has had had a colonoscopy recently.  She is still seeing obgyn.  She is getting her pap smears done by them.  Her next appointment is due in August. She is getting her mammograms done at the breast center.     Current Medications:  Current Outpatient Prescriptions on File Prior to Visit  Medication Sig Dispense Refill  . albuterol (PROVENTIL HFA;VENTOLIN HFA) 108 (90 Base) MCG/ACT inhaler Inhale 2 puffs into the lungs every 6 (six) hours as needed for wheezing or shortness of breath. 1 Inhaler 2  . ALPRAZolam (XANAX) 0.25 MG tablet Take 1 tablet (0.25 mg total) by mouth 3 (three) times daily. 90 tablet 1  . Bioflavonoid Products (PERIDIN-C PO) Take 200 mg by mouth. Peridin c 2 tabs three times  a day for hot flashes     . cephALEXin (KEFLEX) 500 MG capsule TAKE 1 CAPSULE BY MOUTH TWICE A DAY 180 capsule 1  . Coenzyme Q10 (CO Q 10 PO) Take 200 mg by mouth daily.      Marland Kitchen ezetimibe-simvastatin (VYTORIN) 10-40 MG per tablet Take 1 tablet by mouth at bedtime. 30 tablet 6  . fenofibrate micronized (LOFIBRA) 134 MG capsule TAKE 1 CAPSULE BY MOUTH ONCE DAILY 90 capsule 0  . ibuprofen (ADVIL) 200 MG tablet Take 200 mg by mouth every 6 (six) hours as  needed.      Marland Kitchen levothyroxine (SYNTHROID, LEVOTHROID) 75 MCG tablet TAKE 1 TABLET BY MOUTH ONCE DAILY 90 tablet 1  . loratadine (CLARITIN) 10 MG tablet Take 10 mg by mouth as needed.      . mometasone (NASONEX) 50 MCG/ACT nasal spray Place 2 sprays into the nose daily. 17 g 2  . Multiple Vitamin (MULTIVITAMIN) tablet Take 1 tablet by mouth daily.      Marland Kitchen omeprazole (PRILOSEC) 40 MG capsule Take 1 capsule (40 mg total) by mouth daily. 90 capsule 0  . ranitidine (ZANTAC) 300 MG tablet Take 1 tablet (300 mg total) by mouth at bedtime. 30 tablet 1  . Vitamin D, Ergocalciferol, (DRISDOL) 50000 units CAPS capsule TAKE 1 CAPSULE BY MOUTH TWICE PER WEEK 24 capsule 1  . FLUoxetine (PROZAC) 20 MG capsule Take 1 capsule 2 x daily for Mood 180 capsule 1  . HYDROcodone-acetaminophen (NORCO/VICODIN) 5-325 MG per tablet Take 1 tablet by mouth every 6 (six) hours as needed for moderate pain. (Patient not taking: Reported on 08/21/2015) 30 tablet 0   Current Facility-Administered Medications on File Prior to Visit  Medication Dose Route Frequency Provider Last Rate Last Dose  . 0.9 %  sodium chloride infusion  500 mL Intravenous Continuous Ladene Artist, MD        Health Maintenance:   Immunization History  Administered Date(s) Administered  . DTaP 01/27/2003  . Influenza-Unspecified 10/26/2012  . Tdap 07/26/2013    Tetanus:  2015 PPD: done by [facility] Flu vaccine:  Through work   MGM: 2017 DEXA: 2015 Colonoscopy: 2012 Last Eye Exam:   Patient Care Team: Unk Pinto, MD as PCP - General (Internal Medicine) Ladene Artist, MD as Consulting Physician (Gastroenterology) Everlene Farrier, MD as Consulting Physician (Obstetrics and Gynecology) Emelia Loron, MD as Referring Physician (Neurosurgery)  Allergies:  Allergies  Allergen Reactions  . Advicor [Niacin-Lovastatin Er] Swelling    Facial swelling  . Niacin-Lovastatin Er Swelling and Other (See Comments)    Extreme flushing    Medical History:   Past Medical History:  Diagnosis Date  . Acne   . Anxiety   . Arthritis   . Cervical polyp   . Chronic back pain   . Depression   . GERD (gastroesophageal reflux disease)   . Hyperlipidemia   . Hypothyroidism, adult    hypothyroidism  . PCO (polycystic ovaries)   . Plantar fasciitis   . Post-menopausal   . Pre-diabetes     Surgical History:  Past Surgical History:  Procedure Laterality Date  . BRAIN MENINGIOMA EXCISION  1965   at age 44  . BREAST MASS EXCISION     left breast, benign  . DIAGNOSTIC LAPAROSCOPY    . SYNOVIAL CYST EXCISION  2010   l5, s1 left hip  . TONSILLECTOMY    . wisom teeth  1984    Family History:  Family History  Problem Relation  Age of Onset  . Dementia Mother   . Osteoporosis Mother   . Heart disease Father   . Parkinson's disease Father   . Stroke Father   . Diabetes Father   . Hyperlipidemia Father   . Hypertension Father   . Cancer Paternal Aunt     breast  . Cancer Maternal Grandfather     brain    Social History:  Social History  Substance Use Topics  . Smoking status: Never Smoker  . Smokeless tobacco: Not on file  . Alcohol use Yes     Comment: occasional alcohol intake    Review of Systems: Review of Systems  Constitutional: Negative for chills, fever and malaise/fatigue.  HENT: Negative for congestion, ear pain and sore throat.        Difficulty swallowing   Eyes: Negative.   Respiratory: Positive for cough. Negative for shortness of breath and wheezing.   Cardiovascular: Negative for chest pain, palpitations and leg swelling.  Gastrointestinal: Negative for abdominal pain, blood in stool, constipation, diarrhea, heartburn and melena.  Genitourinary: Negative.   Skin: Negative.   Neurological: Negative for dizziness, sensory change, loss of consciousness and headaches.  Psychiatric/Behavioral: Negative for depression. The patient is not nervous/anxious and does not have insomnia.     Physical Exam: Estimated  body mass index is 27.33 kg/m as calculated from the following:   Height as of this encounter: 5' 1.5" (1.562 m).   Weight as of this encounter: 147 lb (66.7 kg). BP 90/60   Pulse (!) 56   Temp 98 F (36.7 C) (Temporal)   Resp 16   Ht 5' 1.5" (1.562 m)   Wt 147 lb (66.7 kg)   BMI 27.33 kg/m   General Appearance: Well nourished well developed, in no apparent distress.  Eyes: PERRLA, EOMs, conjunctiva no swelling or erythema ENT/Mouth: Ear canals normal without obstruction, swelling, erythema, or discharge.  TMs normal bilaterally with no erythema, bulging, retraction, or loss of landmark.  Oropharynx moist and clear with no exudate, erythema, or swelling.   Neck: Supple, thyroid normal. No bruits.  No cervical adenopathy Respiratory: Respiratory effort normal, Breath sounds clear A&P without wheeze, rhonchi, rales.   Cardio: RRR without murmurs, rubs or gallops. Brisk peripheral pulses without edema.  Chest: symmetric, with normal excursions Breasts: Symmetric, without lumps, nipple discharge, retractions.  Abdomen: Soft, nontender, no guarding, rebound, hernias, masses, or organomegaly.  Lymphatics: Non tender without lymphadenopathy.  Genitourinary: Deferred to GYN Musculoskeletal: Full ROM all peripheral extremities,5/5 strength, and normal gait.  Skin: Warm, dry without rashes, lesions, ecchymosis. Neuro: Awake and oriented X 3, Cranial nerves intact, reflexes equal bilaterally. Normal muscle tone, no cerebellar symptoms. Sensation intact.  Psych:  normal affect, Insight and Judgment appropriate.   EKG: WNL no changes.  AORTA SCAN: WNL   Over 40 minutes of exam, counseling, chart review and critical decision making was performed  Starlyn Skeans 10:31 AM [facility]

## 2015-08-22 LAB — URINALYSIS, ROUTINE W REFLEX MICROSCOPIC
Bilirubin Urine: NEGATIVE
GLUCOSE, UA: NEGATIVE
Hgb urine dipstick: NEGATIVE
KETONES UR: NEGATIVE
Leukocytes, UA: NEGATIVE
NITRITE: NEGATIVE
PH: 7.5 (ref 5.0–8.0)
Protein, ur: NEGATIVE
SPECIFIC GRAVITY, URINE: 1.019 (ref 1.001–1.035)

## 2015-08-22 LAB — INSULIN, RANDOM: INSULIN: 8.9 u[IU]/mL (ref 2.0–19.6)

## 2015-08-22 LAB — MICROALBUMIN / CREATININE URINE RATIO
Creatinine, Urine: 103 mg/dL (ref 20–320)
MICROALB UR: 0.5 mg/dL
MICROALB/CREAT RATIO: 5 ug/mg{creat} (ref <30)

## 2015-08-22 LAB — VITAMIN D 25 HYDROXY (VIT D DEFICIENCY, FRACTURES): Vit D, 25-Hydroxy: 86 ng/mL (ref 30–100)

## 2015-09-11 ENCOUNTER — Other Ambulatory Visit: Payer: Self-pay | Admitting: Internal Medicine

## 2015-09-11 ENCOUNTER — Other Ambulatory Visit: Payer: Self-pay | Admitting: Physician Assistant

## 2015-09-11 DIAGNOSIS — J069 Acute upper respiratory infection, unspecified: Secondary | ICD-10-CM

## 2015-09-11 MED FILL — OMEPRAZOLE DR 40 MG CAPSULE: 40 | 90 days supply | Qty: 90 | Fill #0

## 2015-09-11 MED FILL — VIT D2 1.25 MG (50,000 UNIT: 1.25 MG | 84 days supply | Qty: 24 | Fill #1

## 2015-09-11 MED FILL — FLUoxetine HCL 20 MG CAPS: 20 | 90 days supply | Qty: 180 | Fill #0

## 2015-09-11 MED FILL — raNITIdine HCL 300 MG TABS: 300 | 30 days supply | Qty: 30 | Fill #0

## 2015-09-26 DIAGNOSIS — F341 Dysthymic disorder: Secondary | ICD-10-CM | POA: Diagnosis not present

## 2015-09-26 DIAGNOSIS — F411 Generalized anxiety disorder: Secondary | ICD-10-CM | POA: Diagnosis not present

## 2015-09-26 DIAGNOSIS — F424 Excoriation (skin-picking) disorder: Secondary | ICD-10-CM | POA: Diagnosis not present

## 2015-10-04 MED FILL — CEPHALEXIN 500 MG CAPSULE: 500 | 90 days supply | Qty: 180 | Fill #1

## 2015-10-08 DIAGNOSIS — F341 Dysthymic disorder: Secondary | ICD-10-CM | POA: Diagnosis not present

## 2015-10-08 DIAGNOSIS — F411 Generalized anxiety disorder: Secondary | ICD-10-CM | POA: Diagnosis not present

## 2015-10-08 DIAGNOSIS — F424 Excoriation (skin-picking) disorder: Secondary | ICD-10-CM | POA: Diagnosis not present

## 2015-10-17 DIAGNOSIS — F341 Dysthymic disorder: Secondary | ICD-10-CM | POA: Diagnosis not present

## 2015-10-17 DIAGNOSIS — F411 Generalized anxiety disorder: Secondary | ICD-10-CM | POA: Diagnosis not present

## 2015-10-17 DIAGNOSIS — F424 Excoriation (skin-picking) disorder: Secondary | ICD-10-CM | POA: Diagnosis not present

## 2015-10-21 MED FILL — MOMETASONE FUROATE 50 MCG S: 50 | 30 days supply | Qty: 17 | Fill #1

## 2015-10-21 MED FILL — VENTOLIN HFA 90 MCG INHALER: 108 (90 BAS | 25 days supply | Qty: 18 | Fill #1

## 2015-10-28 DIAGNOSIS — F341 Dysthymic disorder: Secondary | ICD-10-CM | POA: Diagnosis not present

## 2015-10-28 DIAGNOSIS — F424 Excoriation (skin-picking) disorder: Secondary | ICD-10-CM | POA: Diagnosis not present

## 2015-10-28 DIAGNOSIS — F411 Generalized anxiety disorder: Secondary | ICD-10-CM | POA: Diagnosis not present

## 2015-11-07 DIAGNOSIS — F424 Excoriation (skin-picking) disorder: Secondary | ICD-10-CM | POA: Diagnosis not present

## 2015-11-07 DIAGNOSIS — F411 Generalized anxiety disorder: Secondary | ICD-10-CM | POA: Diagnosis not present

## 2015-11-07 DIAGNOSIS — F341 Dysthymic disorder: Secondary | ICD-10-CM | POA: Diagnosis not present

## 2015-11-13 ENCOUNTER — Other Ambulatory Visit: Payer: Self-pay | Admitting: Internal Medicine

## 2015-11-13 MED FILL — LEVOTHYROXINE 75 MCG TABLET: 75 | 90 days supply | Qty: 90 | Fill #0

## 2015-11-14 ENCOUNTER — Ambulatory Visit: Payer: Self-pay | Admitting: Internal Medicine

## 2015-11-18 DIAGNOSIS — F424 Excoriation (skin-picking) disorder: Secondary | ICD-10-CM | POA: Diagnosis not present

## 2015-11-18 DIAGNOSIS — F411 Generalized anxiety disorder: Secondary | ICD-10-CM | POA: Diagnosis not present

## 2015-11-18 DIAGNOSIS — F341 Dysthymic disorder: Secondary | ICD-10-CM | POA: Diagnosis not present

## 2015-12-03 MED FILL — EZETIMIBE-SIMVAS 10-40 MG T: 10-40 | 90 days supply | Qty: 90 | Fill #1

## 2015-12-03 MED FILL — raNITIdine HCL 300 MG TABS: 300 | 30 days supply | Qty: 30 | Fill #1

## 2015-12-04 DIAGNOSIS — F411 Generalized anxiety disorder: Secondary | ICD-10-CM | POA: Diagnosis not present

## 2015-12-04 DIAGNOSIS — F424 Excoriation (skin-picking) disorder: Secondary | ICD-10-CM | POA: Diagnosis not present

## 2015-12-04 DIAGNOSIS — F341 Dysthymic disorder: Secondary | ICD-10-CM | POA: Diagnosis not present

## 2015-12-12 ENCOUNTER — Ambulatory Visit: Payer: Self-pay | Admitting: Internal Medicine

## 2015-12-13 ENCOUNTER — Other Ambulatory Visit: Payer: Self-pay | Admitting: Internal Medicine

## 2015-12-13 MED FILL — VIT D2 1.25 MG (50,000 UNIT: 1.25 MG | 84 days supply | Qty: 24 | Fill #0

## 2015-12-26 DIAGNOSIS — F411 Generalized anxiety disorder: Secondary | ICD-10-CM | POA: Diagnosis not present

## 2015-12-26 DIAGNOSIS — F424 Excoriation (skin-picking) disorder: Secondary | ICD-10-CM | POA: Diagnosis not present

## 2015-12-26 DIAGNOSIS — F341 Dysthymic disorder: Secondary | ICD-10-CM | POA: Diagnosis not present

## 2015-12-30 ENCOUNTER — Ambulatory Visit (INDEPENDENT_AMBULATORY_CARE_PROVIDER_SITE_OTHER): Payer: 59 | Admitting: Internal Medicine

## 2015-12-30 ENCOUNTER — Encounter: Payer: Self-pay | Admitting: Internal Medicine

## 2015-12-30 VITALS — BP 122/64 | HR 64 | Temp 98.0°F | Resp 16 | Ht 61.5 in | Wt 154.0 lb

## 2015-12-30 DIAGNOSIS — R7303 Prediabetes: Secondary | ICD-10-CM | POA: Diagnosis not present

## 2015-12-30 DIAGNOSIS — Z79899 Other long term (current) drug therapy: Secondary | ICD-10-CM

## 2015-12-30 DIAGNOSIS — E559 Vitamin D deficiency, unspecified: Secondary | ICD-10-CM

## 2015-12-30 DIAGNOSIS — Z9109 Other allergy status, other than to drugs and biological substances: Secondary | ICD-10-CM | POA: Diagnosis not present

## 2015-12-30 DIAGNOSIS — E782 Mixed hyperlipidemia: Secondary | ICD-10-CM | POA: Diagnosis not present

## 2015-12-30 DIAGNOSIS — E079 Disorder of thyroid, unspecified: Secondary | ICD-10-CM | POA: Diagnosis not present

## 2015-12-30 LAB — CBC WITH DIFFERENTIAL/PLATELET
BASOS PCT: 1 %
Basophils Absolute: 41 cells/uL (ref 0–200)
Eosinophils Absolute: 82 cells/uL (ref 15–500)
Eosinophils Relative: 2 %
HEMATOCRIT: 38.1 % (ref 35.0–45.0)
Hemoglobin: 12.7 g/dL (ref 11.7–15.5)
LYMPHS PCT: 34 %
Lymphs Abs: 1394 cells/uL (ref 850–3900)
MCH: 31.8 pg (ref 27.0–33.0)
MCHC: 33.3 g/dL (ref 32.0–36.0)
MCV: 95.5 fL (ref 80.0–100.0)
MONO ABS: 410 {cells}/uL (ref 200–950)
MPV: 9.5 fL (ref 7.5–12.5)
Monocytes Relative: 10 %
NEUTROS ABS: 2173 {cells}/uL (ref 1500–7800)
Neutrophils Relative %: 53 %
PLATELETS: 279 10*3/uL (ref 140–400)
RBC: 3.99 MIL/uL (ref 3.80–5.10)
RDW: 13.1 % (ref 11.0–15.0)
WBC: 4.1 10*3/uL (ref 3.8–10.8)

## 2015-12-30 MED ORDER — AZELASTINE HCL 0.1 % NA SOLN: 2.0000 | Freq: Two times a day (BID) | NASAL | 2 refills | Status: DC

## 2015-12-30 MED FILL — AZELASTINE 0.1% (137 MCG) S: 0.1 | 30 days supply | Qty: 30 | Fill #0

## 2015-12-30 MED FILL — CEPHALEXIN 500 MG CAPSULE: 500 | 90 days supply | Qty: 180 | Fill #2

## 2015-12-30 NOTE — Progress Notes (Signed)
Assessment and Plan:  Hypertension:  -Continue medication,  -monitor blood pressure at home.  -Continue DASH diet.   -Reminder to go to the ER if any CP, SOB, nausea, dizziness, severe HA, changes vision/speech, left arm numbness and tingling, and jaw pain.  Cholesterol: -Continue diet and exercise.  -Check cholesterol.   Pre-diabetes: -Continue diet and exercise.  -Check A1C  Vitamin D Def: -check level -continue medications.   Allergic rhintis -cont nasal sprays -cont singulair -cont nasal saline  Hypothyroidism -cont levothyroxine -tsh level   Continue diet and meds as discussed. Further disposition pending results of labs.  HPI 55 y.o. female  presents for 3 month follow up with hypertension, hyperlipidemia, prediabetes and vitamin D.   Her blood pressure has been controlled at home, today their BP is BP: 122/64.   She does not workout. She denies chest pain, shortness of breath, dizziness.  She is working with a Physiological scientist.  She is using the elliptical at work.   She is on cholesterol medication and denies myalgias. Her cholesterol is at goal. The cholesterol last visit was:   Lab Results  Component Value Date   CHOL 178 08/21/2015   HDL 71 08/21/2015   LDLCALC 88 08/21/2015   TRIG 94 08/21/2015   CHOLHDL 2.5 08/21/2015     She has been working on diet and exercise for prediabetes, and denies foot ulcerations, hyperglycemia, hypoglycemia , increased appetite, nausea, paresthesia of the feet, polydipsia, polyuria, visual disturbances, vomiting and weight loss. Last A1C in the office was:  Lab Results  Component Value Date   HGBA1C 5.7 (H) 08/21/2015    Patient is on Vitamin D supplement.  Lab Results  Component Value Date   VD25OH 35 08/21/2015      She reports that she still has some nasal congestion.  She is still using flonase and also claritin or zyrtec.  She is constantly having some congestion.     She is taking her thyroid medications  daily.  She is taking it first thing in the morning on an empty stomach.     Current Medications:  Current Outpatient Prescriptions on File Prior to Visit  Medication Sig Dispense Refill  . albuterol (PROVENTIL HFA;VENTOLIN HFA) 108 (90 Base) MCG/ACT inhaler Inhale 2 puffs into the lungs every 6 (six) hours as needed for wheezing or shortness of breath. 1 Inhaler 2  . ALPRAZolam (XANAX) 0.25 MG tablet Take 1 tablet (0.25 mg total) by mouth 3 (three) times daily. 90 tablet 1  . Bioflavonoid Products (PERIDIN-C PO) Take 200 mg by mouth. Peridin c 2 tabs three times a day for hot flashes     . cephALEXin (KEFLEX) 500 MG capsule TAKE 1 CAPSULE BY MOUTH TWICE A DAY 180 capsule 1  . Coenzyme Q10 (CO Q 10 PO) Take 200 mg by mouth daily.      Marland Kitchen ezetimibe-simvastatin (VYTORIN) 10-40 MG tablet Take 1 tablet by mouth at bedtime. 30 tablet 6  . fenofibrate micronized (LOFIBRA) 134 MG capsule TAKE 1 CAPSULE BY MOUTH ONCE DAILY 90 capsule 0  . FLUoxetine (PROZAC) 20 MG capsule TAKE 1 CAPSULE BY MOUTH TWICE DAILY FOR MOOD 180 capsule 1  . HYDROcodone-acetaminophen (NORCO/VICODIN) 5-325 MG per tablet Take 1 tablet by mouth every 6 (six) hours as needed for moderate pain. 30 tablet 0  . ibuprofen (ADVIL) 200 MG tablet Take 200 mg by mouth every 6 (six) hours as needed.      Marland Kitchen levothyroxine (SYNTHROID, LEVOTHROID) 75 MCG tablet TAKE  1 TABLET BY MOUTH ONCE DAILY 90 tablet 1  . loratadine (CLARITIN) 10 MG tablet Take 10 mg by mouth as needed.      . mometasone (NASONEX) 50 MCG/ACT nasal spray Place 2 sprays into the nose daily. 17 g 2  . Multiple Vitamin (MULTIVITAMIN) tablet Take 1 tablet by mouth daily.      Marland Kitchen omeprazole (PRILOSEC) 40 MG capsule TAKE 1 CAPSULE BY MOUTH DAILY. 90 capsule 0  . ranitidine (ZANTAC) 300 MG tablet TAKE 1 TABLET BY MOUTH AT BEDTIME. 30 tablet 1  . Vitamin D, Ergocalciferol, (DRISDOL) 50000 units CAPS capsule TAKE 1 CAPSULE BY MOUTH TWICE PER WEEK 24 capsule 1   No current  facility-administered medications on file prior to visit.     Medical History:  Past Medical History:  Diagnosis Date  . Acne   . Anxiety   . Arthritis   . Cervical polyp   . Chronic back pain   . Depression   . GERD (gastroesophageal reflux disease)   . Hyperlipidemia   . Hypothyroidism, adult    hypothyroidism  . PCO (polycystic ovaries)   . Plantar fasciitis   . Post-menopausal   . Pre-diabetes     Allergies:  Allergies  Allergen Reactions  . Advicor [Niacin-Lovastatin Er] Swelling    Facial swelling  . Niacin-Lovastatin Er Swelling and Other (See Comments)    Extreme flushing     Review of Systems:  Review of Systems  Constitutional: Negative for chills, fever and malaise/fatigue.  HENT: Negative for congestion, ear pain and sore throat.   Eyes: Negative.   Respiratory: Negative for cough, shortness of breath and wheezing.   Cardiovascular: Negative for chest pain, palpitations and leg swelling.  Gastrointestinal: Negative for abdominal pain, blood in stool, constipation, diarrhea, heartburn and melena.  Genitourinary: Negative.   Skin: Negative.   Neurological: Negative for dizziness, sensory change, loss of consciousness and headaches.  Psychiatric/Behavioral: Negative for depression. The patient is not nervous/anxious and does not have insomnia.     Family history- Review and unchanged  Social history- Review and unchanged  Physical Exam: BP 122/64   Pulse 64   Temp 98 F (36.7 C) (Temporal)   Resp 16   Ht 5' 1.5" (1.562 m)   Wt 154 lb (69.9 kg)   BMI 28.63 kg/m  Wt Readings from Last 3 Encounters:  12/30/15 154 lb (69.9 kg)  08/21/15 147 lb (66.7 kg)  02/14/15 149 lb (67.6 kg)    General Appearance: Well nourished well developed, in no apparent distress. Eyes: PERRLA, EOMs, conjunctiva no swelling or erythema ENT/Mouth: Ear canals normal without obstruction, swelling, erythma, discharge.  TMs normal bilaterally.  Oropharynx moist, clear,  without exudate, or postoropharyngeal swelling. Neck: Supple, thyroid normal,no cervical adenopathy  Respiratory: Respiratory effort normal, Breath sounds clear A&P without rhonchi, wheeze, or rale.  No retractions, no accessory usage. Cardio: RRR with no MRGs. Brisk peripheral pulses without edema.  Abdomen: Soft, + BS,  Non tender, no guarding, rebound, hernias, masses. Musculoskeletal: Full ROM, 5/5 strength, Normal gait Skin: Warm, dry without rashes, lesions, ecchymosis.  Neuro: Awake and oriented X 3, Cranial nerves intact. Normal muscle tone, no cerebellar symptoms. Psych: Normal affect, Insight and Judgment appropriate.    Starlyn Skeans, PA-C 3:02 PM [facility]

## 2015-12-31 LAB — HEPATIC FUNCTION PANEL
ALK PHOS: 42 U/L (ref 33–130)
ALT: 22 U/L (ref 6–29)
AST: 26 U/L (ref 10–35)
Albumin: 4.3 g/dL (ref 3.6–5.1)
BILIRUBIN INDIRECT: 0.2 mg/dL (ref 0.2–1.2)
BILIRUBIN TOTAL: 0.3 mg/dL (ref 0.2–1.2)
Bilirubin, Direct: 0.1 mg/dL (ref <=0.2)
Total Protein: 6.3 g/dL (ref 6.1–8.1)

## 2015-12-31 LAB — LIPID PANEL
CHOL/HDL RATIO: 2.6 ratio (ref <5.0)
Cholesterol: 175 mg/dL (ref <200)
HDL: 68 mg/dL (ref >50)
LDL CALC: 86 mg/dL (ref <100)
Triglycerides: 107 mg/dL (ref <150)
VLDL: 21 mg/dL (ref <30)

## 2015-12-31 LAB — TSH: TSH: 0.62 mIU/L

## 2015-12-31 LAB — HEMOGLOBIN A1C
HEMOGLOBIN A1C: 5.3 % (ref <5.7)
Mean Plasma Glucose: 105 mg/dL

## 2015-12-31 LAB — BASIC METABOLIC PANEL WITH GFR
BUN: 9 mg/dL (ref 7–25)
CO2: 27 mmol/L (ref 20–31)
Calcium: 9.9 mg/dL (ref 8.6–10.4)
Chloride: 103 mmol/L (ref 98–110)
Creat: 0.77 mg/dL (ref 0.50–1.05)
GFR, EST NON AFRICAN AMERICAN: 87 mL/min (ref >=60)
GLUCOSE: 71 mg/dL (ref 65–99)
POTASSIUM: 4.3 mmol/L (ref 3.5–5.3)
Sodium: 138 mmol/L (ref 135–146)

## 2016-01-08 DIAGNOSIS — F411 Generalized anxiety disorder: Secondary | ICD-10-CM | POA: Diagnosis not present

## 2016-01-08 DIAGNOSIS — F341 Dysthymic disorder: Secondary | ICD-10-CM | POA: Diagnosis not present

## 2016-01-08 DIAGNOSIS — F424 Excoriation (skin-picking) disorder: Secondary | ICD-10-CM | POA: Diagnosis not present

## 2016-01-10 ENCOUNTER — Other Ambulatory Visit: Payer: Self-pay | Admitting: Physician Assistant

## 2016-01-10 MED FILL — FENOFIBRATE 134 MG CAPSULE: 134 | 90 days supply | Qty: 90 | Fill #0

## 2016-01-10 MED FILL — OMEPRAZOLE DR 40 MG CAPSULE: 40 | 90 days supply | Qty: 90 | Fill #0

## 2016-01-10 MED FILL — FLUoxetine HCL 20 MG CAPS: 20 | 90 days supply | Qty: 180 | Fill #1

## 2016-02-04 MED FILL — LEVOTHYROXINE 75 MCG TABLET: 75 | 90 days supply | Qty: 90 | Fill #1

## 2016-02-21 DIAGNOSIS — F424 Excoriation (skin-picking) disorder: Secondary | ICD-10-CM | POA: Diagnosis not present

## 2016-02-21 DIAGNOSIS — F341 Dysthymic disorder: Secondary | ICD-10-CM | POA: Diagnosis not present

## 2016-02-21 DIAGNOSIS — F411 Generalized anxiety disorder: Secondary | ICD-10-CM | POA: Diagnosis not present

## 2016-03-12 ENCOUNTER — Other Ambulatory Visit: Payer: Self-pay | Admitting: Internal Medicine

## 2016-03-12 ENCOUNTER — Encounter: Payer: Self-pay | Admitting: Internal Medicine

## 2016-03-12 MED ORDER — LINACLOTIDE 145 MCG PO CAPS: 145.0000 ug | ORAL_CAPSULE | Freq: Every day | ORAL | 2 refills | Status: DC

## 2016-03-12 MED FILL — LINZESS 145 MCG CAPSULE: 145 | 90 days supply | Qty: 90 | Fill #0

## 2016-03-17 MED FILL — VIT D2 1.25 MG (50,000 UNIT: 1.25 MG | 84 days supply | Qty: 24 | Fill #1

## 2016-04-02 MED FILL — CEPHALEXIN 500 MG CAPSULE: 500 | 90 days supply | Qty: 180 | Fill #3

## 2016-04-09 DIAGNOSIS — Z01419 Encounter for gynecological examination (general) (routine) without abnormal findings: Secondary | ICD-10-CM | POA: Diagnosis not present

## 2016-04-09 DIAGNOSIS — F424 Excoriation (skin-picking) disorder: Secondary | ICD-10-CM | POA: Diagnosis not present

## 2016-04-09 DIAGNOSIS — F411 Generalized anxiety disorder: Secondary | ICD-10-CM | POA: Diagnosis not present

## 2016-04-09 DIAGNOSIS — F341 Dysthymic disorder: Secondary | ICD-10-CM | POA: Diagnosis not present

## 2016-04-09 DIAGNOSIS — Z6829 Body mass index (BMI) 29.0-29.9, adult: Secondary | ICD-10-CM | POA: Diagnosis not present

## 2016-04-17 MED FILL — AMOX TR-K CLV 875-125 MG TA: 875-125 | 10 days supply | Qty: 20 | Fill #0

## 2016-04-28 ENCOUNTER — Other Ambulatory Visit: Payer: Self-pay | Admitting: Internal Medicine

## 2016-04-28 MED FILL — LEVOTHYROXINE 75 MCG TABLET: 75 | 90 days supply | Qty: 90 | Fill #0

## 2016-05-28 ENCOUNTER — Other Ambulatory Visit: Payer: Self-pay | Admitting: Physician Assistant

## 2016-05-28 ENCOUNTER — Other Ambulatory Visit: Payer: Self-pay | Admitting: Internal Medicine

## 2016-05-28 MED FILL — FLUoxetine HCL 20 MG CAPS: 20 | 90 days supply | Qty: 180 | Fill #0

## 2016-05-28 MED FILL — OMEPRAZOLE DR 40 MG CAPSULE: 40 | 90 days supply | Qty: 90 | Fill #0

## 2016-05-28 MED FILL — FENOFIBRATE 134 MG CAPSULE: 134 | 90 days supply | Qty: 90 | Fill #0

## 2016-06-03 ENCOUNTER — Ambulatory Visit: Payer: Self-pay | Admitting: Internal Medicine

## 2016-06-04 DIAGNOSIS — F411 Generalized anxiety disorder: Secondary | ICD-10-CM | POA: Diagnosis not present

## 2016-06-04 DIAGNOSIS — F424 Excoriation (skin-picking) disorder: Secondary | ICD-10-CM | POA: Diagnosis not present

## 2016-06-04 DIAGNOSIS — F341 Dysthymic disorder: Secondary | ICD-10-CM | POA: Diagnosis not present

## 2016-06-09 ENCOUNTER — Encounter: Payer: Self-pay | Admitting: *Deleted

## 2016-06-18 ENCOUNTER — Other Ambulatory Visit: Payer: Self-pay | Admitting: Internal Medicine

## 2016-06-18 MED FILL — VIT D2 1.25 MG (50,000 UNIT: 1.25 MG | 84 days supply | Qty: 24 | Fill #0

## 2016-06-23 ENCOUNTER — Ambulatory Visit: Payer: Self-pay | Admitting: Physician Assistant

## 2016-07-01 ENCOUNTER — Other Ambulatory Visit: Payer: Self-pay | Admitting: Internal Medicine

## 2016-07-01 MED FILL — CEPHALEXIN 500 MG CAPSULE: 500 | 90 days supply | Qty: 180 | Fill #0

## 2016-07-06 MED FILL — EZETIMIBE-SIMVAS 10-40 MG T: 10-40 | 30 days supply | Qty: 30 | Fill #2

## 2016-07-24 MED FILL — LEVOTHYROXINE 75 MCG TABLET: 75 | 90 days supply | Qty: 90 | Fill #1

## 2016-08-20 ENCOUNTER — Other Ambulatory Visit: Payer: Self-pay | Admitting: Physician Assistant

## 2016-08-20 ENCOUNTER — Encounter: Payer: Self-pay | Admitting: Internal Medicine

## 2016-08-20 MED ORDER — EZETIMIBE-SIMVASTATIN 10-40 MG PO TABS: 1.0000 | ORAL_TABLET | Freq: Every day | ORAL | 0 refills | Status: DC

## 2016-08-20 MED FILL — EZETIMIBE-SIMVAS 10-40 MG T: 10-40 | 90 days supply | Qty: 90 | Fill #0

## 2016-08-31 ENCOUNTER — Other Ambulatory Visit: Payer: Self-pay | Admitting: Physician Assistant

## 2016-08-31 MED FILL — OMEPRAZOLE DR 40 MG CAPSULE: 40 | 90 days supply | Qty: 90 | Fill #0

## 2016-08-31 NOTE — Progress Notes (Signed)
Complete Physical  Assessment and Plan:  PCOS (polycystic ovaries) monitor  Thyroid disease Hypothyroidism-check TSH level, continue medications the same, reminded to take on an empty stomach 30-52mins before food.  -     TSH  Mixed hyperlipidemia -continue medications, check lipids, decrease fatty foods, increase activity.  -     CBC with Differential/Platelet -     BASIC METABOLIC PANEL WITH GFR -     Hepatic function panel -     Lipid panel -     EKG 12-Lead  Pre-diabetes Discussed general issues about diabetes pathophysiology and management., Educational material distributed., Suggested low cholesterol diet., Encouraged aerobic exercise., Discussed foot care., Reminded to get yearly retinal exam. -     Hemoglobin A1c  Acne, unspecified acne type Continue meds  Anxiety-  continue medications, stress management techniques discussed, increase water, good sleep hygiene discussed, increase exercise, and increase veggies.  -     EKG 12-Lead  Chronic low back pain without sciatica, unspecified back pain laterality Continue follow up  Vitamin D deficiency Continue supplement  Environmental allergies  Medication management -     CBC with Differential/Platelet -     BASIC METABOLIC PANEL WITH GFR -     Hepatic function panel -     Magnesium  Encounter for general adult medical examination with abnormal findings  Screening for deficiency anemia -     Iron and TIBC -     Vitamin B12  Screening for hematuria or proteinuria -     Urinalysis, Routine w reflex microscopic -     Microalbumin / creatinine urine ratio  Constipation, unspecified constipation type Will try this 1 or 2 a day, increase water -     linaclotide (LINZESS) 72 MCG capsule; Take 1 capsule (72 mcg total) by mouth daily before breakfast.  Gastroesophageal reflux disease without esophagitis Continue PPI/H2 blocker, diet discussed -     omeprazole (PRILOSEC) 20 MG capsule; Take 1 capsule (20 mg total)  by mouth daily.  Discussed med's effects and SE's. Screening labs and tests as requested with regular follow-up as recommended. Over 40 minutes of exam, counseling, chart review, and complex, high level critical decision making was performed this visit.   HPI  56 y.o. female  presents for a complete physical and follow up for has PCO (polycystic ovaries); Anxiety; Hyperlipidemia; Thyroid disease; Pre-diabetes; Chronic back pain; Acne; Vitamin D deficiency; and Environmental allergies on her problem list..  Her blood pressure has been controlled at home, today their BP is BP: 114/68 She does workout. She denies chest pain, shortness of breath, dizziness.   She is on cholesterol medication and denies myalgias. Her cholesterol is at goal. The cholesterol last visit was:   Lab Results  Component Value Date   CHOL 175 12/30/2015   HDL 68 12/30/2015   LDLCALC 86 12/30/2015   TRIG 107 12/30/2015   CHOLHDL 2.6 12/30/2015    Last A1C in the office was:  Lab Results  Component Value Date   HGBA1C 5.3 12/30/2015   Last GFR: Lab Results  Component Value Date   GFRNONAA 87 12/30/2015   Patient is on Vitamin D supplement.   Lab Results  Component Value Date   VD25OH 79 08/21/2015     She is on thyroid medication. Her medication was not changed last visit.   Lab Results  Component Value Date   TSH 0.62 12/30/2015  .  BMI is Body mass index is 28.8 kg/m., she is working on diet  and exercise. Wt Readings from Last 3 Encounters:  09/01/16 152 lb 6.4 oz (69.1 kg)  12/30/15 154 lb (69.9 kg)  08/21/15 147 lb (66.7 kg)    Current Medications:  Current Outpatient Prescriptions on File Prior to Visit  Medication Sig Dispense Refill  . albuterol (PROVENTIL HFA;VENTOLIN HFA) 108 (90 Base) MCG/ACT inhaler Inhale 2 puffs into the lungs every 6 (six) hours as needed for wheezing or shortness of breath. 1 Inhaler 2  . ALPRAZolam (XANAX) 0.25 MG tablet Take 1 tablet (0.25 mg total) by mouth 3  (three) times daily. 90 tablet 1  . azelastine (ASTELIN) 0.1 % nasal spray Place 2 sprays into both nostrils 2 (two) times daily. Use in each nostril as directed 30 mL 2  . Bioflavonoid Products (PERIDIN-C PO) Take 200 mg by mouth. Peridin c 2 tabs three times a day for hot flashes     . Coenzyme Q10 (CO Q 10 PO) Take 200 mg by mouth daily.      Marland Kitchen ezetimibe-simvastatin (VYTORIN) 10-40 MG tablet Take 1 tablet by mouth at bedtime. 90 tablet 0  . fenofibrate micronized (LOFIBRA) 134 MG capsule TAKE 1 CAPSULE BY MOUTH ONCE DAILY 90 capsule 0  . FLUoxetine (PROZAC) 20 MG capsule TAKE 1 CAPSULE BY MOUTH TWICE DAILY FOR MOOD 180 capsule 1  . HYDROcodone-acetaminophen (NORCO/VICODIN) 5-325 MG per tablet Take 1 tablet by mouth every 6 (six) hours as needed for moderate pain. 30 tablet 0  . ibuprofen (ADVIL) 200 MG tablet Take 200 mg by mouth every 6 (six) hours as needed.      Marland Kitchen levothyroxine (SYNTHROID, LEVOTHROID) 75 MCG tablet TAKE 1 TABLET BY MOUTH ONCE DAILY 90 tablet 1  . linaclotide (LINZESS) 145 MCG CAPS capsule Take 1 capsule (145 mcg total) by mouth daily before breakfast. 90 capsule 2  . loratadine (CLARITIN) 10 MG tablet Take 10 mg by mouth as needed.      . mometasone (NASONEX) 50 MCG/ACT nasal spray Place 2 sprays into the nose daily. 17 g 2  . Multiple Vitamin (MULTIVITAMIN) tablet Take 1 tablet by mouth daily.      Marland Kitchen omeprazole (PRILOSEC) 40 MG capsule TAKE 1 CAPSULE BY MOUTH ONCE DAILY 90 capsule 1  . Vitamin D, Ergocalciferol, (DRISDOL) 50000 units CAPS capsule TAKE 1 CAPSULE BY MOUTH TWICE PER WEEK 24 capsule 1   No current facility-administered medications on file prior to visit.    Allergies:  Allergies  Allergen Reactions  . Advicor [Niacin-Lovastatin Er] Swelling    Facial swelling  . Niacin-Lovastatin Er Swelling and Other (See Comments)    Extreme flushing   Medical History:  She has PCO (polycystic ovaries); Anxiety; Hyperlipidemia; Thyroid disease; Pre-diabetes; Chronic  back pain; Acne; Vitamin D deficiency; and Environmental allergies on her problem list.   Health Maintenance:   Immunization History  Administered Date(s) Administered  . DTaP 01/27/2003  . Influenza-Unspecified 10/26/2012  . Tdap 07/26/2013    Tetanus: 2015 Pneumovax: Prevnar 13:  Flu vaccine: at work at [employer] Zostavax:  LMP: Pap: March 2018 [employer] MGM: 2017 DEXA: 2015 Colonoscopy:2012 EGD: 2012  Patient Care Team: Unk Pinto, MD as PCP - General (Internal Medicine) Ladene Artist, MD as Consulting Physician (Gastroenterology) Everlene Farrier, MD as Consulting Physician (Obstetrics and Gynecology) Emelia Loron, MD as Referring Physician (Neurosurgery)  Surgical History:  She has a past surgical history that includes Synovial cyst excision (2010); Brain meningioma excision (1965); Tonsillectomy; wisom teeth (1984); Diagnostic laparoscopy; and Breast mass excision. Family History:  Herfamily history includes Cancer in her maternal grandfather and paternal aunt; Dementia in her mother; Diabetes in her father; Heart disease in her father; Hyperlipidemia in her father; Hypertension in her father; Osteoporosis in her mother; Parkinson's disease in her father; Stroke in her father. Social History:  She reports that she has never smoked. She has never used smokeless tobacco. She reports that she drinks alcohol. She reports that she does not use drugs.  Review of Systems: Review of Systems  Constitutional: Negative.   HENT: Negative.   Eyes: Negative.   Respiratory: Negative.   Cardiovascular: Negative.   Gastrointestinal: Positive for constipation and diarrhea. Negative for abdominal pain, blood in stool, heartburn, melena, nausea and vomiting.  Genitourinary: Negative.   Musculoskeletal: Negative.   Skin: Negative.   Neurological: Negative.   Endo/Heme/Allergies: Negative.   Psychiatric/Behavioral: Negative.     Physical Exam: Estimated body mass index is 28.8  kg/m as calculated from the following:   Height as of this encounter: 5\' 1"  (1.549 m).   Weight as of this encounter: 152 lb 6.4 oz (69.1 kg). BP 114/68   Pulse 81   Temp 97.7 F (36.5 C)   Resp 14   Ht 5\' 1"  (1.549 m)   Wt 152 lb 6.4 oz (69.1 kg)   SpO2 96%   BMI 28.80 kg/m  General Appearance: Well nourished, in no apparent distress.  Eyes: PERRLA, EOMs, conjunctiva no swelling or erythema, normal fundi and vessels.  Sinuses: No Frontal/maxillary tenderness  ENT/Mouth: Ext aud canals clear, normal light reflex with TMs without erythema, bulging. Good dentition. No erythema, swelling, or exudate on post pharynx. Tonsils not swollen or erythematous. Hearing normal.  Neck: Supple, thyroid normal. No bruits  Respiratory: Respiratory effort normal, BS equal bilaterally without rales, rhonchi, wheezing or stridor.  Cardio: RRR without murmurs, rubs or gallops. Brisk peripheral pulses without edema.  Chest: symmetric, with normal excursions and percussion.  Breasts: Symmetric, without lumps, nipple discharge, retractions.  Abdomen: Soft, nontender, no guarding, rebound, hernias, masses, or organomegaly.  Lymphatics: Non tender without lymphadenopathy.  Genitourinary:  Musculoskeletal: Full ROM all peripheral extremities,5/5 strength, and normal gait.  Skin: Warm, dry without rashes, lesions, ecchymosis. Neuro: Cranial nerves intact, reflexes equal bilaterally. Normal muscle tone, no cerebellar symptoms. Sensation intact.  Psych: Awake and oriented X 3, normal affect, Insight and Judgment appropriate.   EKG: WNL no ST changes. AORTA SCAN: defer  Vicie Mutters 4:27 PM [facility]  Future Appointments Date Time Provider [address]  09/09/2017 2:00 PM Vicie Mutters, PA-C [id number] None

## 2016-09-01 ENCOUNTER — Encounter: Payer: Self-pay | Admitting: Physician Assistant

## 2016-09-01 ENCOUNTER — Ambulatory Visit (INDEPENDENT_AMBULATORY_CARE_PROVIDER_SITE_OTHER): Payer: 59 | Admitting: Physician Assistant

## 2016-09-01 VITALS — BP 114/68 | HR 81 | Temp 97.7°F | Resp 14 | Ht 61.0 in | Wt 152.4 lb

## 2016-09-01 DIAGNOSIS — Z130 Encounter for screening for diseases of the blood and blood-forming organs and certain disorders involving the immune mechanism: Secondary | ICD-10-CM | POA: Diagnosis not present

## 2016-09-01 DIAGNOSIS — Z1389 Encounter for screening for other disorder: Secondary | ICD-10-CM

## 2016-09-01 DIAGNOSIS — Z9109 Other allergy status, other than to drugs and biological substances: Secondary | ICD-10-CM

## 2016-09-01 DIAGNOSIS — G8929 Other chronic pain: Secondary | ICD-10-CM

## 2016-09-01 DIAGNOSIS — Z79899 Other long term (current) drug therapy: Secondary | ICD-10-CM | POA: Diagnosis not present

## 2016-09-01 DIAGNOSIS — I10 Essential (primary) hypertension: Secondary | ICD-10-CM | POA: Diagnosis not present

## 2016-09-01 DIAGNOSIS — Z136 Encounter for screening for cardiovascular disorders: Secondary | ICD-10-CM

## 2016-09-01 DIAGNOSIS — E079 Disorder of thyroid, unspecified: Secondary | ICD-10-CM

## 2016-09-01 DIAGNOSIS — Z0000 Encounter for general adult medical examination without abnormal findings: Secondary | ICD-10-CM

## 2016-09-01 DIAGNOSIS — E559 Vitamin D deficiency, unspecified: Secondary | ICD-10-CM

## 2016-09-01 DIAGNOSIS — E282 Polycystic ovarian syndrome: Secondary | ICD-10-CM

## 2016-09-01 DIAGNOSIS — M545 Low back pain, unspecified: Secondary | ICD-10-CM

## 2016-09-01 DIAGNOSIS — E782 Mixed hyperlipidemia: Secondary | ICD-10-CM | POA: Diagnosis not present

## 2016-09-01 DIAGNOSIS — R7303 Prediabetes: Secondary | ICD-10-CM

## 2016-09-01 DIAGNOSIS — K5900 Constipation, unspecified: Secondary | ICD-10-CM

## 2016-09-01 DIAGNOSIS — L709 Acne, unspecified: Secondary | ICD-10-CM

## 2016-09-01 DIAGNOSIS — F419 Anxiety disorder, unspecified: Secondary | ICD-10-CM

## 2016-09-01 DIAGNOSIS — K219 Gastro-esophageal reflux disease without esophagitis: Secondary | ICD-10-CM

## 2016-09-01 DIAGNOSIS — Z0001 Encounter for general adult medical examination with abnormal findings: Secondary | ICD-10-CM

## 2016-09-01 LAB — LIPID PANEL
CHOL/HDL RATIO: 2.2 ratio (ref <5.0)
Cholesterol: 142 mg/dL (ref <200)
HDL: 64 mg/dL (ref >50)
LDL CALC: 59 mg/dL (ref <100)
Triglycerides: 94 mg/dL (ref <150)
VLDL: 19 mg/dL (ref <30)

## 2016-09-01 LAB — IRON AND TIBC
%SAT: 19 % (ref 11–50)
Iron: 84 ug/dL (ref 45–160)
TIBC: 436 ug/dL (ref 250–450)
UIBC: 352 ug/dL

## 2016-09-01 LAB — BASIC METABOLIC PANEL WITH GFR
BUN: 11 mg/dL (ref 7–25)
CHLORIDE: 101 mmol/L (ref 98–110)
CO2: 27 mmol/L (ref 20–32)
Calcium: 9.8 mg/dL (ref 8.6–10.4)
Creat: 0.85 mg/dL (ref 0.50–1.05)
GFR, Est African American: 89 mL/min (ref >=60)
GFR, Est Non African American: 77 mL/min (ref >=60)
GLUCOSE: 102 mg/dL — AB (ref 65–99)
POTASSIUM: 4.1 mmol/L (ref 3.5–5.3)
Sodium: 138 mmol/L (ref 135–146)

## 2016-09-01 LAB — CBC WITH DIFFERENTIAL/PLATELET
BASOS PCT: 1 %
Basophils Absolute: 36 cells/uL (ref 0–200)
EOS ABS: 108 {cells}/uL (ref 15–500)
Eosinophils Relative: 3 %
HEMATOCRIT: 37.5 % (ref 35.0–45.0)
Hemoglobin: 12.3 g/dL (ref 11.7–15.5)
LYMPHS ABS: 1728 {cells}/uL (ref 850–3900)
Lymphocytes Relative: 48 %
MCH: 31.3 pg (ref 27.0–33.0)
MCHC: 32.8 g/dL (ref 32.0–36.0)
MCV: 95.4 fL (ref 80.0–100.0)
MONO ABS: 252 {cells}/uL (ref 200–950)
MPV: 9.2 fL (ref 7.5–12.5)
Monocytes Relative: 7 %
NEUTROS ABS: 1476 {cells}/uL — AB (ref 1500–7800)
Neutrophils Relative %: 41 %
Platelets: 290 10*3/uL (ref 140–400)
RBC: 3.93 MIL/uL (ref 3.80–5.10)
RDW: 13.3 % (ref 11.0–15.0)
WBC: 3.6 10*3/uL — ABNORMAL LOW (ref 3.8–10.8)

## 2016-09-01 LAB — HEPATIC FUNCTION PANEL
ALBUMIN: 4.6 g/dL (ref 3.6–5.1)
ALK PHOS: 40 U/L (ref 33–130)
ALT: 26 U/L (ref 6–29)
AST: 25 U/L (ref 10–35)
BILIRUBIN INDIRECT: 0.2 mg/dL (ref 0.2–1.2)
BILIRUBIN TOTAL: 0.3 mg/dL (ref 0.2–1.2)
Bilirubin, Direct: 0.1 mg/dL (ref <=0.2)
Total Protein: 6.4 g/dL (ref 6.1–8.1)

## 2016-09-01 LAB — TSH: TSH: 0.48 m[IU]/L

## 2016-09-01 MED ORDER — LINACLOTIDE 72 MCG PO CAPS: 72.0000 ug | ORAL_CAPSULE | Freq: Every day | ORAL | 3 refills | Status: DC

## 2016-09-01 MED ORDER — OMEPRAZOLE 20 MG PO CPDR
20.0000 mg | DELAYED_RELEASE_CAPSULE | Freq: Every day | ORAL | 2 refills | Status: DC
Start: 2016-09-01 — End: 2017-11-01

## 2016-09-01 MED FILL — LINZESS 72 MCG CAPSULE: 72 | 90 days supply | Qty: 90 | Fill #0

## 2016-09-01 NOTE — Patient Instructions (Addendum)
Your ears and sinuses are connected by the eustachian tube. When your sinuses are inflamed, this can close off the tube and cause fluid to collect in your middle ear. This can then cause dizziness, popping, clicking, ringing, and echoing in your ears. This is often NOT an infection and does NOT require antibiotics, it is caused by inflammation so the treatments help the inflammation. This can take a long time to get better so please be patient.  Here are things you can do to help with this: - Try the Flonase or Nasonex. Remember to spray each nostril twice towards the outer part of your eye.  Do not sniff but instead pinch your nose and tilt your head back to help the medicine get into your sinuses.  The best time to do this is at bedtime.Stop if you get blurred vision or nose bleeds.  -While drinking fluids, pinch and hold nose close and swallow, to help open eustachian tubes to drain fluid behind ear drums. -Please pick one of the over the counter allergy medications below and take it once daily for allergies.  It will also help with fluid behind ear drums. Claritin or loratadine cheapest but likely the weakest  Zyrtec or certizine at night because it can make you sleepy The strongest is allegra or fexafinadine  Cheapest at [employer], [employer], [employer] -can use decongestant over the counter, please do not use if you have high blood pressure or certain heart conditions.   if worsening HA, changes vision/speech, imbalance, weakness go to the ER   GETTING OFF OF PPI's    Nexium/protonix/prilosec/Omeprazole/Dexilant/Aciphex are called PPI's, they are great at healing your stomach but should only be taken for a short period of time.     Recent studies have shown that taken for a long time they  can increase the risk of osteoporosis (weakening of your bones), pneumonia, low magnesium, restless legs, Cdiff (infection that causes diarrhea), DEMENTIA and most recently kidney damage / disease / insufficiency.      Due to this information we want to try to stop the PPI but if you try to stop it abruptly this can cause rebound acid and worsening symptoms.   So this is how we want you to get off the PPI:  - Start taking the nexium/protonix/prilosec/PPI  every other day with  zantac (ranitidine) 2 x a day for 2-4 weeks - some people stay on this dosage and can not taper off further. Our main goal is to limit the dosage and amount you are taking so if you need to stay on this dose.   - then decrease the PPI to every 3 days while taking the zantac (ranitidine) 300mg  twice a day the other  days for 2-4  Weeks  - then you can try the zantac (ranitidine) 300mg  once at night or up to 2 x day as needed.  - you can continue on this once at night or stop all together  - Avoid alcohol, spicy foods, NSAIDS (aleve, ibuprofen) at this time. See foods below.   +++++++++++++++++++++++++++++++++++++++++++  Food Choices for Gastroesophageal Reflux Disease  When you have gastroesophageal reflux disease (GERD), the foods you eat and your eating habits are very important. Choosing the right foods can help ease the discomfort of GERD. WHAT GENERAL GUIDELINES DO I NEED TO FOLLOW?  Choose fruits, vegetables, whole grains, low-fat dairy products, and low-fat meat, fish, and poultry.  Limit fats such as oils, salad dressings, butter, nuts, and avocado.  Keep a food  diary to identify foods that cause symptoms.  Avoid foods that cause reflux. These may be different for different people.  Eat frequent small meals instead of three large meals each day.  Eat your meals slowly, in a relaxed setting.  Limit fried foods.  Cook foods using methods other than frying.  Avoid drinking alcohol.  Avoid drinking large amounts of liquids with your meals.  Avoid bending over or lying down until 2-3 hours after eating.   WHAT FOODS ARE NOT RECOMMENDED? The following are some foods and drinks that may worsen your  symptoms:  Vegetables Tomatoes. Tomato juice. Tomato and spaghetti sauce. Chili peppers. Onion and garlic. Horseradish. Fruits Oranges, grapefruit, and lemon (fruit and juice). Meats High-fat meats, fish, and poultry. This includes hot dogs, ribs, ham, sausage, salami, and bacon. Dairy Whole milk and chocolate milk. Sour cream. Cream. Butter. Ice cream. Cream cheese.  Beverages Coffee and tea, with or without caffeine. Carbonated beverages or energy drinks. Condiments Hot sauce. Barbecue sauce.  Sweets/Desserts Chocolate and cocoa. Donuts. Peppermint and spearmint. Fats and Oils High-fat foods, including [place] fries and potato chips. Other Vinegar. Strong spices, such as black pepper, white pepper, red pepper, cayenne, curry powder, cloves, ginger, and chili powder. Nexium/protonix/prilosec are called PPI's, they are great at healing your stomach but should only be taken for a short period of time.   Drink 80-100 oz a day of water, measure it out Eat 3 meals a day, have to do breakfast, eat protein- hard boiled eggs, protein bar like nature valley protein bar, greek yogurt like oikos triple zero, chobani 100, or light n fit greek

## 2016-09-02 LAB — MICROALBUMIN / CREATININE URINE RATIO
Creatinine, Urine: 44 mg/dL (ref 20–320)
Microalb Creat Ratio: 7 mcg/mg creat (ref <30)
Microalb, Ur: 0.3 mg/dL

## 2016-09-02 LAB — URINALYSIS, ROUTINE W REFLEX MICROSCOPIC
BILIRUBIN URINE: NEGATIVE
Glucose, UA: NEGATIVE
HGB URINE DIPSTICK: NEGATIVE
KETONES UR: NEGATIVE
Leukocytes, UA: NEGATIVE
Nitrite: NEGATIVE
PH: 6.5 (ref 5.0–8.0)
Protein, ur: NEGATIVE
SPECIFIC GRAVITY, URINE: 1.009 (ref 1.001–1.035)

## 2016-09-02 LAB — MAGNESIUM: Magnesium: 2 mg/dL (ref 1.5–2.5)

## 2016-09-02 LAB — HEMOGLOBIN A1C
HEMOGLOBIN A1C: 5.4 % (ref <5.7)
MEAN PLASMA GLUCOSE: 108 mg/dL

## 2016-09-02 LAB — VITAMIN B12: VITAMIN B 12: 584 pg/mL (ref 200–1100)

## 2016-09-02 MED FILL — OMEPRAZOLE 20 MG CAP: 20 | 90 days supply | Qty: 90 | Fill #0

## 2016-09-04 ENCOUNTER — Other Ambulatory Visit: Payer: Self-pay | Admitting: Internal Medicine

## 2016-09-04 DIAGNOSIS — Z1231 Encounter for screening mammogram for malignant neoplasm of breast: Secondary | ICD-10-CM

## 2016-09-23 ENCOUNTER — Other Ambulatory Visit: Payer: Self-pay | Admitting: Internal Medicine

## 2016-09-23 DIAGNOSIS — J069 Acute upper respiratory infection, unspecified: Secondary | ICD-10-CM

## 2016-09-23 MED FILL — FLUoxetine HCL 20 MG CAPS: 20 | 90 days supply | Qty: 180 | Fill #1

## 2016-09-23 MED FILL — VIT D2 1.25 MG (50,000 UNIT: 1.25 MG | 84 days supply | Qty: 24 | Fill #1

## 2016-09-24 MED FILL — raNITIdine HCL 300 MG TABS: 300 | 90 days supply | Qty: 90 | Fill #0

## 2016-09-29 ENCOUNTER — Ambulatory Visit: Payer: 59

## 2016-10-08 MED FILL — CEPHALEXIN 500 MG CAPSULE: 500 | 90 days supply | Qty: 180 | Fill #1

## 2016-10-15 ENCOUNTER — Other Ambulatory Visit: Payer: Self-pay | Admitting: Physician Assistant

## 2016-10-15 MED FILL — LEVOTHYROXINE 75 MCG TABLET: 75 | 90 days supply | Qty: 90 | Fill #0

## 2016-10-27 ENCOUNTER — Ambulatory Visit: Payer: 59

## 2016-11-24 ENCOUNTER — Ambulatory Visit: Payer: 59

## 2016-11-26 ENCOUNTER — Ambulatory Visit
Admission: RE | Admit: 2016-11-26 | Discharge: 2016-11-26 | Disposition: A | Discharge status: Home / Self Care | Payer: 59 | Source: Ambulatory Visit | Attending: Internal Medicine | Admitting: Internal Medicine

## 2016-11-26 DIAGNOSIS — Z1231 Encounter for screening mammogram for malignant neoplasm of breast: Secondary | ICD-10-CM

## 2016-12-25 ENCOUNTER — Other Ambulatory Visit: Payer: Self-pay | Admitting: Physician Assistant

## 2016-12-25 MED FILL — VIT D2 1.25 MG (50,000 UNIT: 1.25 MG | 84 days supply | Qty: 24 | Fill #0

## 2016-12-25 MED FILL — OMEPRAZOLE DR 40 MG CAPSULE: 40 | 90 days supply | Qty: 90 | Fill #1

## 2017-01-11 ENCOUNTER — Other Ambulatory Visit: Payer: Self-pay | Admitting: Physician Assistant

## 2017-01-11 ENCOUNTER — Other Ambulatory Visit: Payer: Self-pay | Admitting: Internal Medicine

## 2017-01-11 MED FILL — EZETIMIBE-SIMVAS 10-40 MG T: 10-40 | 90 days supply | Qty: 90 | Fill #0

## 2017-01-11 MED FILL — LEVOTHYROXINE 75 MCG TABLET: 75 | 90 days supply | Qty: 90 | Fill #1

## 2017-01-11 MED FILL — CEPHALEXIN 500 MG CAPSULE: 500 | 90 days supply | Qty: 180 | Fill #0

## 2017-01-11 MED FILL — FLUoxetine HCL 20 MG CAPS: 20 | 90 days supply | Qty: 180 | Fill #0

## 2017-02-26 MED FILL — OMEPRAZOLE 20 MG CAP: 20 | 90 days supply | Qty: 90 | Fill #1

## 2017-03-09 ENCOUNTER — Ambulatory Visit: Payer: Self-pay | Admitting: Physician Assistant

## 2017-03-19 ENCOUNTER — Other Ambulatory Visit: Payer: Self-pay | Admitting: Physician Assistant

## 2017-03-23 ENCOUNTER — Ambulatory Visit: Payer: 59 | Admitting: Physician Assistant

## 2017-03-23 ENCOUNTER — Encounter: Payer: Self-pay | Admitting: Physician Assistant

## 2017-03-23 VITALS — BP 122/78 | HR 61 | Temp 97.5°F | Ht 61.0 in | Wt 157.0 lb

## 2017-03-23 DIAGNOSIS — Z79899 Other long term (current) drug therapy: Secondary | ICD-10-CM | POA: Diagnosis not present

## 2017-03-23 DIAGNOSIS — E079 Disorder of thyroid, unspecified: Secondary | ICD-10-CM | POA: Diagnosis not present

## 2017-03-23 DIAGNOSIS — E782 Mixed hyperlipidemia: Secondary | ICD-10-CM | POA: Diagnosis not present

## 2017-03-23 MED ORDER — ALBUTEROL SULFATE HFA 108 (90 BASE) MCG/ACT IN AERS: 2.0000 | INHALATION_SPRAY | Freq: Four times a day (QID) | RESPIRATORY_TRACT | 2 refills | Status: DC | PRN

## 2017-03-23 MED ORDER — SPIRONOLACTONE 25 MG PO TABS: 25.0000 mg | ORAL_TABLET | Freq: Every day | ORAL | 3 refills | Status: DC

## 2017-03-23 MED ORDER — FENOFIBRATE MICRONIZED 134 MG PO CAPS: 134.0000 mg | ORAL_CAPSULE | Freq: Every day | ORAL | 3 refills | Status: DC

## 2017-03-23 MED ORDER — MOMETASONE FUROATE 50 MCG/ACT NA SUSP: 2.0000 | Freq: Every day | NASAL | 2 refills | Status: DC

## 2017-03-23 MED FILL — LEVOTHYROXINE 75 MCG TABLET: 75 | 90 days supply | Qty: 90 | Fill #0

## 2017-03-23 MED FILL — SPIRONOLACTONE 25 MG TABLET: 25 | 30 days supply | Qty: 30 | Fill #0

## 2017-03-23 MED FILL — FENOFIBRATE 134 MG CAPSULE: 134 | 90 days supply | Qty: 90 | Fill #0

## 2017-03-23 MED FILL — VENTOLIN HFA 90 MCG INHALER: 108 (90 BAS | 25 days supply | Qty: 18 | Fill #0

## 2017-03-23 MED FILL — MOMETASONE FUROATE 50 MCG S: 50 | 60 days supply | Qty: 17 | Fill #0

## 2017-03-23 NOTE — Progress Notes (Addendum)
FOLLOW UP  Assessment and Plan:  PCOS (polycystic ovaries)with acne and hair growth Will try to stop ABX keflex BID and switch to spirolactone Check BMP 1 month monitor  Thyroid disease Hypothyroidism-check TSH level, continue medications the same, reminded to take on an empty stomach 30-71mins before food.  -     TSH  Mixed hyperlipidemia -continue medications, check lipids, decrease fatty foods, increase activity.  -     CBC with Differential/Platelet -     BASIC METABOLIC PANEL WITH GFR -     Hepatic function panel -     Lipid panel  Medication management -     CBC with Differential/Platelet -     BASIC METABOLIC PANEL WITH GFR -     Hepatic function panel -     Magnesium   Discussed med's effects and SE's. Screening labs and tests as requested with regular follow-up as recommended. Over 30 minutes of exam, counseling, chart review, and complex, high level critical decision making was performed this visit.  Future Appointments  Date Time Provider [place]  09/09/2017  2:00 PM Vicie Mutters, PA-C [facility] None    HPI  57 y.o. female  presents for a  follow up for has PCO (polycystic ovaries); Anxiety; Hyperlipidemia; Thyroid disease; Pre-diabetes; Chronic back pain; Acne; Vitamin D deficiency; and Environmental allergies on their problem list..  Her blood pressure has been controlled at home, today their BP is BP: 122/78 She does workout. She denies chest pain, shortness of breath, dizziness.   She takes xanax PRN for anxiety/flying, but very rare.  She is on linzess 72 1-2 a day, on benefiber, and senokot as needed.   She is on cholesterol medication and denies myalgias. Her cholesterol is at goal. The cholesterol last visit was:   Lab Results  Component Value Date   CHOL 142 09/01/2016   HDL 64 09/01/2016   LDLCALC 59 09/01/2016   TRIG 94 09/01/2016   CHOLHDL 2.2 09/01/2016    Last A1C in the office was:  Lab Results  Component Value Date   HGBA1C 5.4 09/01/2016   Last GFR: Lab Results  Component Value Date   GFRNONAA 77 09/01/2016   Patient is on Vitamin D supplement.   Lab Results  Component Value Date   VD25OH 33 08/21/2015     She is on thyroid medication. Her medication was not changed last visit.   Lab Results  Component Value Date   TSH 0.48 09/01/2016  .  BMI is Body mass index is 29.66 kg/m., she is working on diet and exercise. Wt Readings from Last 3 Encounters:  03/23/17 157 lb (71.2 kg)  09/01/16 152 lb 6.4 oz (69.1 kg)  12/30/15 154 lb (69.9 kg)    Current Medications:  Current Outpatient Medications on File Prior to Visit  Medication Sig Dispense Refill  . albuterol (PROVENTIL HFA;VENTOLIN HFA) 108 (90 Base) MCG/ACT inhaler Inhale 2 puffs into the lungs every 6 (six) hours as needed for wheezing or shortness of breath. 1 Inhaler 2  . ALPRAZolam (XANAX) 0.25 MG tablet Take 1 tablet (0.25 mg total) by mouth 3 (three) times daily. 90 tablet 1  . Bioflavonoid Products (PERIDIN-C PO) Take 200 mg by mouth. Peridin c 2 tabs three times a day for hot flashes     . cephALEXin (KEFLEX) 500 MG capsule TAKE 1 CAPSULE BY MOUTH TWICE DAILY 180 capsule 1  . Coenzyme Q10 (CO Q 10 PO) Take 200 mg by mouth daily.      Marland Kitchen  ezetimibe-simvastatin (VYTORIN) 10-40 MG tablet TAKE 1 TABLET BY MOUTH AT BEDTIME. 90 tablet 0  . fenofibrate micronized (LOFIBRA) 134 MG capsule TAKE 1 CAPSULE BY MOUTH ONCE DAILY 90 capsule 0  . FLUoxetine (PROZAC) 20 MG capsule TAKE 1 CAPSULE BY MOUTH TWICE DAILY FOR MOOD 180 capsule 1  . HYDROcodone-acetaminophen (NORCO/VICODIN) 5-325 MG per tablet Take 1 tablet by mouth every 6 (six) hours as needed for moderate pain. 30 tablet 0  . ibuprofen (ADVIL) 200 MG tablet Take 200 mg by mouth every 6 (six) hours as needed.      Marland Kitchen levothyroxine (SYNTHROID, LEVOTHROID) 75 MCG tablet TAKE 1 TABLET BY MOUTH ONCE DAILY 90 tablet 1  . linaclotide (LINZESS) 72 MCG capsule Take 1 capsule (72 mcg total) by mouth  daily before breakfast. 90 capsule 3  . loratadine (CLARITIN) 10 MG tablet Take 10 mg by mouth as needed.      . Multiple Vitamin (MULTIVITAMIN) tablet Take 1 tablet by mouth daily.      Marland Kitchen omeprazole (PRILOSEC) 20 MG capsule Take 1 capsule (20 mg total) by mouth daily. 90 capsule 2  . ranitidine (ZANTAC) 300 MG tablet TAKE 1 TABLET BY MOUTH AT BEDTIME 90 tablet 1  . Vitamin D, Ergocalciferol, (DRISDOL) 50000 units CAPS capsule TAKE 1 CAPSULE BY MOUTH TWICE PER WEEK 24 capsule 1  . azelastine (ASTELIN) 0.1 % nasal spray Place 2 sprays into both nostrils 2 (two) times daily. Use in each nostril as directed (Patient taking differently: Place 2 sprays into both nostrils as needed. Use in each nostril as directed) 30 mL 2  . mometasone (NASONEX) 50 MCG/ACT nasal spray Place 2 sprays into the nose daily. 17 g 2   No current facility-administered medications on file prior to visit.    Allergies:  Allergies  Allergen Reactions  . Advicor [Niacin-Lovastatin Er] Swelling    Facial swelling  . Niacin-Lovastatin Er Swelling and Other (See Comments)    Extreme flushing   Medical History:  She has PCO (polycystic ovaries); Anxiety; Hyperlipidemia; Thyroid disease; Pre-diabetes; Chronic back pain; Acne; Vitamin D deficiency; and Environmental allergies on their problem list.   Review of Systems: Review of Systems  Constitutional: Negative.   HENT: Negative.   Eyes: Negative.   Respiratory: Negative.   Cardiovascular: Negative.   Gastrointestinal: Positive for constipation and diarrhea. Negative for abdominal pain, blood in stool, heartburn, melena, nausea and vomiting.  Genitourinary: Negative.   Musculoskeletal: Negative.   Skin: Negative.   Neurological: Negative.   Endo/Heme/Allergies: Negative.   Psychiatric/Behavioral: Negative.     Physical Exam: Estimated body mass index is 29.66 kg/m as calculated from the following:   Height as of this encounter: 5\' 1"  (1.549 m).   Weight as of  this encounter: 157 lb (71.2 kg). BP 122/78   Pulse 61   Temp (!) 97.5 F (36.4 C)   Ht 5\' 1"  (1.549 m)   Wt 157 lb (71.2 kg)   SpO2 99%   BMI 29.66 kg/m  General Appearance: Well nourished, in no apparent distress.  Eyes: PERRLA, EOMs, conjunctiva no swelling or erythema, normal fundi and vessels.  Sinuses: No Frontal/maxillary tenderness  ENT/Mouth: Ext aud canals clear, normal light reflex with TMs without erythema, bulging. Good dentition. No erythema, swelling, or exudate on post pharynx. Tonsils not swollen or erythematous. Hearing normal.  Neck: Supple, thyroid normal. No bruits  Respiratory: Respiratory effort normal, BS equal bilaterally without rales, rhonchi, wheezing or stridor.  Cardio: RRR without murmurs, rubs or  gallops. Brisk peripheral pulses without edema.  Chest: symmetric, with normal excursions and percussion.  Abdomen: Soft, nontender, no guarding, rebound, hernias, masses, or organomegaly.  Lymphatics: Non tender without lymphadenopathy.  Musculoskeletal: Full ROM all peripheral extremities,5/5 strength, and normal gait.  Skin: Warm, dry without rashes, lesions, ecchymosis. Neuro: Cranial nerves intact, reflexes equal bilaterally. Normal muscle tone, no cerebellar symptoms. Sensation intact.  Psych: Awake and oriented X 3, normal affect, Insight and Judgment appropriate.

## 2017-03-23 NOTE — Patient Instructions (Addendum)
Start on spirolactone 25mg  1/2 pill a day for 2 weeks, then go up to 1 pill a day for 2 weeks Stay on the keflex twice a day for 1 month  After 1 month cut the keflex to 1 pill a day x 1 month and then stop Can stay on the 25mg  of the spirolactone or can increase to 2 a day.   Follow up 1 month for BMP check

## 2017-03-24 LAB — CBC WITH DIFFERENTIAL/PLATELET
Basophils Absolute: 22 cells/uL (ref 0–200)
Basophils Relative: 0.6 %
EOS PCT: 2.2 %
Eosinophils Absolute: 79 cells/uL (ref 15–500)
HCT: 37.1 % (ref 35.0–45.0)
Hemoglobin: 12.7 g/dL (ref 11.7–15.5)
LYMPHS ABS: 1224 {cells}/uL (ref 850–3900)
MCH: 31.4 pg (ref 27.0–33.0)
MCHC: 34.2 g/dL (ref 32.0–36.0)
MCV: 91.6 fL (ref 80.0–100.0)
MONOS PCT: 10.2 %
MPV: 10 fL (ref 7.5–12.5)
NEUTROS ABS: 1908 {cells}/uL (ref 1500–7800)
Neutrophils Relative %: 53 %
PLATELETS: 274 10*3/uL (ref 140–400)
RBC: 4.05 10*6/uL (ref 3.80–5.10)
RDW: 12.1 % (ref 11.0–15.0)
Total Lymphocyte: 34 %
WBC mixed population: 367 cells/uL (ref 200–950)
WBC: 3.6 10*3/uL — AB (ref 3.8–10.8)

## 2017-03-24 LAB — LIPID PANEL
CHOLESTEROL: 160 mg/dL (ref <200)
HDL: 64 mg/dL (ref >50)
LDL CHOLESTEROL (CALC): 79 mg/dL
Non-HDL Cholesterol (Calc): 96 mg/dL (calc) (ref <130)
TRIGLYCERIDES: 86 mg/dL (ref <150)
Total CHOL/HDL Ratio: 2.5 (calc) (ref <5.0)

## 2017-03-24 LAB — BASIC METABOLIC PANEL WITH GFR
BUN: 10 mg/dL (ref 7–25)
CALCIUM: 9.7 mg/dL (ref 8.6–10.4)
CHLORIDE: 100 mmol/L (ref 98–110)
CO2: 32 mmol/L (ref 20–32)
Creat: 0.81 mg/dL (ref 0.50–1.05)
GFR, Est African American: 93 mL/min/{1.73_m2} (ref >=60)
GFR, Est Non African American: 81 mL/min/{1.73_m2} (ref >=60)
GLUCOSE: 91 mg/dL (ref 65–99)
Potassium: 4.7 mmol/L (ref 3.5–5.3)
Sodium: 137 mmol/L (ref 135–146)

## 2017-03-24 LAB — TSH: TSH: 0.6 m[IU]/L (ref 0.40–4.50)

## 2017-03-24 LAB — HEPATIC FUNCTION PANEL
AG Ratio: 2.5 (calc) (ref 1.0–2.5)
ALBUMIN MSPROF: 4.7 g/dL (ref 3.6–5.1)
ALT: 32 U/L — AB (ref 6–29)
AST: 27 U/L (ref 10–35)
Alkaline phosphatase (APISO): 45 U/L (ref 33–130)
Bilirubin, Direct: 0.1 mg/dL (ref 0.0–0.2)
GLOBULIN: 1.9 g/dL (ref 1.9–3.7)
Indirect Bilirubin: 0.3 mg/dL (calc) (ref 0.2–1.2)
TOTAL PROTEIN: 6.6 g/dL (ref 6.1–8.1)
Total Bilirubin: 0.4 mg/dL (ref 0.2–1.2)

## 2017-03-25 MED FILL — VIT D2 1.25 MG (50,000 UNIT: 1.25 MG | 84 days supply | Qty: 24 | Fill #1

## 2017-04-22 ENCOUNTER — Other Ambulatory Visit: Payer: 59

## 2017-04-22 ENCOUNTER — Other Ambulatory Visit: Payer: Self-pay

## 2017-04-22 DIAGNOSIS — Z79899 Other long term (current) drug therapy: Secondary | ICD-10-CM | POA: Diagnosis not present

## 2017-04-22 LAB — BASIC METABOLIC PANEL WITH GFR
BUN: 11 mg/dL (ref 7–25)
CHLORIDE: 101 mmol/L (ref 98–110)
CO2: 28 mmol/L (ref 20–32)
Calcium: 9.9 mg/dL (ref 8.6–10.4)
Creat: 0.8 mg/dL (ref 0.50–1.05)
GFR, Est African American: 95 mL/min/{1.73_m2} (ref >=60)
GFR, Est Non African American: 82 mL/min/{1.73_m2} (ref >=60)
GLUCOSE: 77 mg/dL (ref 65–99)
POTASSIUM: 4.4 mmol/L (ref 3.5–5.3)
SODIUM: 137 mmol/L (ref 135–146)

## 2017-04-30 ENCOUNTER — Other Ambulatory Visit: Payer: Self-pay | Admitting: Physician Assistant

## 2017-04-30 MED ORDER — SPIRONOLACTONE 25 MG PO TABS: 25.0000 mg | ORAL_TABLET | Freq: Two times a day (BID) | ORAL | 3 refills | Status: DC

## 2017-04-30 MED FILL — CEPHALEXIN 500 MG CAPSULE: 500 | 90 days supply | Qty: 180 | Fill #1

## 2017-04-30 MED FILL — SPIRONOLACTONE 25 MG TABLET: 25 | 30 days supply | Qty: 60 | Fill #0

## 2017-04-30 NOTE — Progress Notes (Signed)
Future Appointments  Date Time Provider [place]  09/09/2017  2:00 PM Vicie Mutters, PA-C [facility] None

## 2017-05-24 MED FILL — SPIRONOLACTONE 25 MG TABLET: 25 | 30 days supply | Qty: 60 | Fill #1

## 2017-06-15 ENCOUNTER — Other Ambulatory Visit: Payer: Self-pay | Admitting: Internal Medicine

## 2017-06-15 MED FILL — VIT D2 1.25 MG (50,000 UNIT: 1.25 MG | 84 days supply | Qty: 24 | Fill #0

## 2017-06-15 MED FILL — OMEPRAZOLE 20 MG CAP: 20 | 90 days supply | Qty: 90 | Fill #2

## 2017-06-15 MED FILL — LEVOTHYROXINE 75 MCG TABLET: 75 | 90 days supply | Qty: 90 | Fill #1

## 2017-06-22 MED FILL — SPIRONOLACTONE 25 MG TABLET: 25 | 30 days supply | Qty: 60 | Fill #2

## 2017-07-18 MED FILL — FLUoxetine HCL 20 MG CAPS: 20 | 90 days supply | Qty: 180 | Fill #1

## 2017-07-25 MED FILL — SPIRONOLACTONE 25 MG TABLET: 25 | 30 days supply | Qty: 60 | Fill #3

## 2017-08-25 MED FILL — LINZESS 72 MCG CAPSULE: 72 | 90 days supply | Qty: 90 | Fill #1

## 2017-08-30 ENCOUNTER — Other Ambulatory Visit: Payer: Self-pay | Admitting: Physician Assistant

## 2017-08-30 MED FILL — SPIRONOLACTONE 25 MG TABS: 25 | 30 days supply | Qty: 60 | Fill #0

## 2017-09-09 ENCOUNTER — Encounter: Payer: Self-pay | Admitting: Physician Assistant

## 2017-09-13 ENCOUNTER — Other Ambulatory Visit: Payer: Self-pay | Admitting: Physician Assistant

## 2017-09-13 MED FILL — LEVOTHYROXINE 75 MCG TABLET: 75 | 90 days supply | Qty: 90 | Fill #0

## 2017-09-21 NOTE — Progress Notes (Signed)
Complete Physical  Assessment and Plan:  PCOS (polycystic ovaries) monitor  Thyroid disease Hypothyroidism-check TSH level, continue medications the same, reminded to take on an empty stomach 30-26mins before food.  -     TSH  Mixed hyperlipidemia -continue medications, check lipids, decrease fatty foods, increase activity.  -     CBC with Differential/Platelet -     BASIC METABOLIC PANEL WITH GFR -     Hepatic function panel -     Lipid panel  Acne, unspecified acne type Continue meds  Anxiety-  continue medications, stress management techniques discussed, increase water, good sleep hygiene discussed, increase exercise, and increase veggies.   Chronic low back pain without sciatica, unspecified back pain laterality Continue follow up  Vitamin D deficiency Continue supplement  Environmental allergies  Medication management -     CBC with Differential/Platelet -     BASIC METABOLIC PANEL WITH GFR -     Hepatic function panel -     Magnesium  Encounter for general adult medical examination with abnormal findings  Screening for deficiency anemia -     Iron and TIBC -     Vitamin B12  Screening for hematuria or proteinuria -     Urinalysis, Routine w reflex microscopic -     Microalbumin / creatinine urine ratio  Constipation, unspecified constipation type Will try this 1 or 2 a day, increase water -     linaclotide (LINZESS) 72 MCG capsule; Take 1 capsule (72 mcg total) by mouth daily before breakfast.  Gastroesophageal reflux disease without esophagitis Continue PPI/H2 blocker, diet discussed -     omeprazole (PRILOSEC) 20 MG capsule; Take 1 capsule (20 mg total) by mouth daily.  Discussed med's effects and SE's. Screening labs and tests as requested with regular follow-up as recommended. Over 40 minutes of exam, counseling, chart review, and complex, high level critical decision making was performed this visit.   HPI  57 y.o. female  presents for a complete  physical and follow up for has PCO (polycystic ovaries); Anxiety; Hyperlipidemia; Thyroid disease; Pre-diabetes; Chronic back pain; Acne; Vitamin D deficiency; and Environmental allergies on their problem list..  Her blood pressure has been controlled at home, today their BP is BP: 118/64 She does workout. She denies chest pain, shortness of breath, dizziness.   She is on cholesterol medication and denies myalgias. Her cholesterol is at goal. The cholesterol last visit was:   Lab Results  Component Value Date   CHOL 160 03/23/2017   HDL 64 03/23/2017   LDLCALC 79 03/23/2017   TRIG 86 03/23/2017   CHOLHDL 2.5 03/23/2017    Last A1C in the office was:  Lab Results  Component Value Date   HGBA1C 5.4 09/01/2016   Last GFR: Lab Results  Component Value Date   GFRNONAA 82 04/22/2017   Patient is on Vitamin D supplement.   Lab Results  Component Value Date   VD25OH 71 08/21/2015     She is on thyroid medication. Her medication was not changed last visit.   Lab Results  Component Value Date   TSH 0.60 03/23/2017  .  BMI is Body mass index is 29.56 kg/m., she is working on diet and exercise. Wt Readings from Last 3 Encounters:  09/22/17 159 lb (72.1 kg)  03/23/17 157 lb (71.2 kg)  09/01/16 152 lb 6.4 oz (69.1 kg)    Current Medications:  Current Outpatient Medications on File Prior to Visit  Medication Sig Dispense Refill  . albuterol (  PROVENTIL HFA;VENTOLIN HFA) 108 (90 Base) MCG/ACT inhaler Inhale 2 puffs into the lungs every 6 (six) hours as needed for wheezing or shortness of breath. 1 Inhaler 2  . ALPRAZolam (XANAX) 0.25 MG tablet Take 1 tablet (0.25 mg total) by mouth 3 (three) times daily. 90 tablet 1  . Bioflavonoid Products (PERIDIN-C PO) Take 200 mg by mouth. Peridin c 2 tabs three times a day for hot flashes     . Coenzyme Q10 (CO Q 10 PO) Take 200 mg by mouth daily.      Marland Kitchen ezetimibe-simvastatin (VYTORIN) 10-40 MG tablet TAKE 1 TABLET BY MOUTH AT BEDTIME. 90 tablet  0  . fenofibrate micronized (LOFIBRA) 134 MG capsule Take 1 capsule (134 mg total) by mouth daily. 90 capsule 3  . FLUoxetine (PROZAC) 20 MG capsule TAKE 1 CAPSULE BY MOUTH TWICE DAILY FOR MOOD 180 capsule 1  . HYDROcodone-acetaminophen (NORCO/VICODIN) 5-325 MG per tablet Take 1 tablet by mouth every 6 (six) hours as needed for moderate pain. 30 tablet 0  . ibuprofen (ADVIL) 200 MG tablet Take 200 mg by mouth every 6 (six) hours as needed.      Marland Kitchen levothyroxine (SYNTHROID, LEVOTHROID) 75 MCG tablet TAKE 1 TABLET BY MOUTH ONCE DAILY 90 tablet 1  . linaclotide (LINZESS) 72 MCG capsule Take 1 capsule (72 mcg total) by mouth daily before breakfast. 90 capsule 3  . loratadine (CLARITIN) 10 MG tablet Take 10 mg by mouth as needed.      . mometasone (NASONEX) 50 MCG/ACT nasal spray Place 2 sprays into the nose daily. 17 g 2  . Multiple Vitamin (MULTIVITAMIN) tablet Take 1 tablet by mouth daily.      Marland Kitchen omeprazole (PRILOSEC) 20 MG capsule Take 1 capsule (20 mg total) by mouth daily. 90 capsule 2  . spironolactone (ALDACTONE) 25 MG tablet TAKE 1 TABLET BY MOUTH TWICE DAILY 60 tablet 0  . Vitamin D, Ergocalciferol, (DRISDOL) 50000 units CAPS capsule TAKE 1 CAPSULE BY MOUTH TWICE A WEEK 24 capsule 1  . azelastine (ASTELIN) 0.1 % nasal spray Place 2 sprays into both nostrils 2 (two) times daily. Use in each nostril as directed (Patient taking differently: Place 2 sprays into both nostrils as needed. Use in each nostril as directed) 30 mL 2   No current facility-administered medications on file prior to visit.    Allergies:  Allergies  Allergen Reactions  . Advicor [Niacin-Lovastatin Er] Swelling    Facial swelling  . Niacin-Lovastatin Er Swelling and Other (See Comments)    Extreme flushing   Medical History:  She has PCO (polycystic ovaries); Anxiety; Hyperlipidemia; Thyroid disease; Pre-diabetes; Chronic back pain; Acne; Vitamin D deficiency; and Environmental allergies on their problem list.    Health Maintenance:   Immunization History  Administered Date(s) Administered  . DTaP 01/27/2003  . Influenza-Unspecified 10/26/2012  . Tdap 07/26/2013   Tetanus: 2015 Pneumovax: Prevnar 13:  Flu vaccine: at work at [employer] Zostavax:  LMP: Pap: March 2018 [place] MGM: 11/2016 DEXA: 2015 Colonoscopy:2012 EGD: 2012  Patient Care Team: Unk Pinto, MD as PCP - General (Internal Medicine) Ladene Artist, MD as Consulting Physician (Gastroenterology) Everlene Farrier, MD as Consulting Physician (Obstetrics and Gynecology) Emelia Loron, MD as Referring Physician (Neurosurgery)  Surgical History:  She has a past surgical history that includes Synovial cyst excision (2010); Brain meningioma excision (1965); Tonsillectomy; wisom teeth (1984); Diagnostic laparoscopy; Breast mass excision; and Breast excisional biopsy (Left). Family History:  Herfamily history includes Breast cancer in her paternal aunt;  Cancer in her maternal grandfather and paternal aunt; Dementia in her mother; Diabetes in her father; Heart disease in her father; Hyperlipidemia in her father; Hypertension in her father; Osteoporosis in her mother; Parkinson's disease in her father; Stroke in her father. Social History:  She reports that she has never smoked. She has never used smokeless tobacco. She reports that she drinks alcohol. She reports that she does not use drugs.  Review of Systems: Review of Systems  Constitutional: Negative.   HENT: Negative.   Eyes: Negative.   Respiratory: Negative.   Cardiovascular: Negative.   Gastrointestinal: Positive for constipation and diarrhea. Negative for abdominal pain, blood in stool, heartburn, melena, nausea and vomiting.  Genitourinary: Negative.   Musculoskeletal: Negative.   Skin: Negative.   Neurological: Negative.   Endo/Heme/Allergies: Negative.   Psychiatric/Behavioral: Negative.     Physical Exam: Estimated body mass index is 29.56 kg/m as calculated  from the following:   Height as of this encounter: 5' 1.5" (1.562 m).   Weight as of this encounter: 159 lb (72.1 kg). BP 118/64   Pulse 63   Temp 97.7 F (36.5 C)   Resp 14   Ht 5' 1.5" (1.562 m)   Wt 159 lb (72.1 kg)   SpO2 98%   BMI 29.56 kg/m  General Appearance: Well nourished, in no apparent distress.  Eyes: PERRLA, EOMs, conjunctiva no swelling or erythema, normal fundi and vessels.  Sinuses: No Frontal/maxillary tenderness  ENT/Mouth: Ext aud canals clear, normal light reflex with TMs without erythema, bulging. Good dentition. No erythema, swelling, or exudate on post pharynx. Tonsils not swollen or erythematous. Hearing normal.  Neck: Supple, thyroid normal. No bruits  Respiratory: Respiratory effort normal, BS equal bilaterally without rales, rhonchi, wheezing or stridor.  Cardio: RRR without murmurs, rubs or gallops. Brisk peripheral pulses without edema.  Chest: symmetric, with normal excursions and percussion.  Breasts: Symmetric, without lumps, nipple discharge, retractions.  Abdomen: Soft, nontender, no guarding, rebound, hernias, masses, or organomegaly.  Lymphatics: Non tender without lymphadenopathy.  Genitourinary:  Musculoskeletal: Full ROM all peripheral extremities,5/5 strength, and normal gait.  Skin: Warm, dry without rashes, lesions, ecchymosis. Neuro: Cranial nerves intact, reflexes equal bilaterally. Normal muscle tone, no cerebellar symptoms. Sensation intact.  Psych: Awake and oriented X 3, normal affect, Insight and Judgment appropriate.   EKG: WNL no ST changes. AORTA SCAN: defer  Vicie Mutters 3:38 PM [facility]  Future Appointments  Date Time Provider [place]  09/28/2018  3:00 PM Vicie Mutters, PA-C [facility] None

## 2017-09-22 ENCOUNTER — Encounter: Payer: Self-pay | Admitting: Physician Assistant

## 2017-09-22 ENCOUNTER — Ambulatory Visit: Payer: 59 | Admitting: Physician Assistant

## 2017-09-22 VITALS — BP 118/64 | HR 63 | Temp 97.7°F | Resp 14 | Ht 61.5 in | Wt 159.0 lb

## 2017-09-22 DIAGNOSIS — Z0001 Encounter for general adult medical examination with abnormal findings: Secondary | ICD-10-CM

## 2017-09-22 DIAGNOSIS — E782 Mixed hyperlipidemia: Secondary | ICD-10-CM | POA: Diagnosis not present

## 2017-09-22 DIAGNOSIS — F419 Anxiety disorder, unspecified: Secondary | ICD-10-CM

## 2017-09-22 DIAGNOSIS — Z9109 Other allergy status, other than to drugs and biological substances: Secondary | ICD-10-CM

## 2017-09-22 DIAGNOSIS — G8929 Other chronic pain: Secondary | ICD-10-CM

## 2017-09-22 DIAGNOSIS — R7303 Prediabetes: Secondary | ICD-10-CM

## 2017-09-22 DIAGNOSIS — E079 Disorder of thyroid, unspecified: Secondary | ICD-10-CM | POA: Diagnosis not present

## 2017-09-22 DIAGNOSIS — Z0000 Encounter for general adult medical examination without abnormal findings: Secondary | ICD-10-CM | POA: Diagnosis not present

## 2017-09-22 DIAGNOSIS — E282 Polycystic ovarian syndrome: Secondary | ICD-10-CM

## 2017-09-22 DIAGNOSIS — M545 Low back pain, unspecified: Secondary | ICD-10-CM

## 2017-09-22 DIAGNOSIS — Z130 Encounter for screening for diseases of the blood and blood-forming organs and certain disorders involving the immune mechanism: Secondary | ICD-10-CM

## 2017-09-22 DIAGNOSIS — E559 Vitamin D deficiency, unspecified: Secondary | ICD-10-CM | POA: Diagnosis not present

## 2017-09-22 DIAGNOSIS — L709 Acne, unspecified: Secondary | ICD-10-CM

## 2017-09-22 DIAGNOSIS — Z79899 Other long term (current) drug therapy: Secondary | ICD-10-CM | POA: Diagnosis not present

## 2017-09-22 DIAGNOSIS — K5900 Constipation, unspecified: Secondary | ICD-10-CM

## 2017-09-22 DIAGNOSIS — Z1389 Encounter for screening for other disorder: Secondary | ICD-10-CM | POA: Diagnosis not present

## 2017-09-22 MED ORDER — SPIRONOLACTONE 100 MG PO TABS
100.0000 mg | ORAL_TABLET | Freq: Two times a day (BID) | ORAL | 1 refills | Status: DC
Start: 2017-09-22 — End: 2017-12-17

## 2017-09-22 MED ORDER — LINACLOTIDE 145 MCG PO CAPS: 145.0000 ug | ORAL_CAPSULE | Freq: Every day | ORAL | 1 refills | Status: DC

## 2017-09-22 MED ORDER — LINACLOTIDE 72 MCG PO CAPS: ORAL_CAPSULE | ORAL | 3 refills | Status: DC

## 2017-09-22 MED ORDER — ALPRAZOLAM 0.25 MG PO TABS
0.2500 mg | ORAL_TABLET | Freq: Three times a day (TID) | ORAL | 0 refills | Status: DC
Start: 2017-09-22 — End: 2020-05-16

## 2017-09-22 MED FILL — ALPRAZolam 0.25 MG TABS: 0.25 | 30 days supply | Qty: 90 | Fill #0

## 2017-09-22 MED FILL — SPIRONOLACTONE 100 MG TABS: 100 | 90 days supply | Qty: 180 | Fill #0

## 2017-09-22 NOTE — Patient Instructions (Signed)
Check out  Mini habits for weight loss book  2 apps for tracking food is myfitness pal  loseit OR can take picture of your food  Are you an emotional eater? Do you eat more when you're feeling stressed? Do you eat when you're not hungry or when you're full? Do you eat to feel better (to calm and soothe yourself when you're sad, mad, bored, anxious, etc.)? Do you reward yourself with food? Do you regularly eat until you've stuffed yourself? Does food make you feel safe? Do you feel like food is a friend? Do you feel powerless or out of control around food?  If you answered yes to some of these questions than it is likely that you are an emotional eater. This is normally a learned behavior and can take time to first recognize the signs and second BREAK THE HABIT. But here is more information and tips to help.   The difference between emotional hunger and physical hunger Emotional hunger can be powerful, so it's easy to mistake it for physical hunger. But there are clues you can look for to help you tell physical and emotional hunger apart.  Emotional hunger comes on suddenly. It hits you in an instant and feels overwhelming and urgent. Physical hunger, on the other hand, comes on more gradually. The urge to eat doesn't feel as dire or demand instant satisfaction (unless you haven't eaten for a very long time).  Emotional hunger craves specific comfort foods. When you're physically hungry, almost anything sounds good-including healthy stuff like vegetables. But emotional hunger craves junk food or sugary snacks that provide an instant rush. You feel like you need cheesecake or pizza, and nothing else will do.  Emotional hunger often leads to mindless eating. Before you know it, you've eaten a whole bag of chips or an entire pint of ice cream without really paying attention or fully enjoying it. When you're eating in response to physical hunger, you're typically more aware of what you're  doing.  Emotional hunger isn't satisfied once you're full. You keep wanting more and more, often eating until you're uncomfortably stuffed. Physical hunger, on the other hand, doesn't need to be stuffed. You feel satisfied when your stomach is full.  Emotional hunger isn't located in the stomach. Rather than a growling belly or a pang in your stomach, you feel your hunger as a craving you can't get out of your head. You're focused on specific textures, tastes, and smells.  Emotional hunger often leads to regret, guilt, or shame. When you eat to satisfy physical hunger, you're unlikely to feel guilty or ashamed because you're simply giving your body what it needs. If you feel guilty after you eat, it's likely because you know deep down that you're not eating for nutritional reasons.  Identify your emotional eating triggers What situations, places, or feelings make you reach for the comfort of food? Most emotional eating is linked to unpleasant feelings, but it can also be triggered by positive emotions, such as rewarding yourself for achieving a goal or celebrating a holiday or happy event. Common causes of emotional eating include:  Stuffing emotions - Eating can be a way to temporarily silence or "stuff down" uncomfortable emotions, including anger, fear, sadness, anxiety, loneliness, resentment, and shame. While you're numbing yourself with food, you can avoid the difficult emotions you'd rather not feel.  Boredom or feelings of emptiness - Do you ever eat simply to give yourself something to do, to relieve boredom, or as a  way to fill a void in your life? You feel unfulfilled and empty, and food is a way to occupy your mouth and your time. In the moment, it fills you up and distracts you from underlying feelings of purposelessness and dissatisfaction with your life.  Childhood habits - Think back to your childhood memories of food. Did your parents reward good behavior with ice cream, take you out  for pizza when you got a good report card, or serve you sweets when you were feeling sad? These habits can often carry over into adulthood. Or your eating may be driven by nostalgia-for cherished memories of grilling burgers in the backyard with your dad or baking and eating cookies with your mom.  Social influences - Getting together with other people for a meal is a great way to relieve stress, but it can also lead to overeating. It's easy to overindulge simply because the food is there or because everyone else is eating. You may also overeat in social situations out of nervousness. Or perhaps your family or circle of friends encourages you to overeat, and it's easier to go along with the group.  Stress - Ever notice how stress makes you hungry? It's not just in your mind. When stress is chronic, as it so often is in our chaotic, fast-paced world, your body produces high levels of the stress hormone, cortisol. Cortisol triggers cravings for salty, sweet, and fried foods-foods that give you a burst of energy and pleasure. The more uncontrolled stress in your life, the more likely you are to turn to food for emotional relief.  Find other ways to feed your feelings If you don't know how to manage your emotions in a way that doesn't involve food, you won't be able to control your eating habits for very long. Diets so often fail because they offer logical nutritional advice which only works if you have conscious control over your eating habits. It doesn't work when emotions hijack the process, demanding an immediate payoff with food.  In order to stop emotional eating, you have to find other ways to fulfill yourself emotionally. It's not enough to understand the cycle of emotional eating or even to understand your triggers, although that's a huge first step. You need alternatives to food that you can turn to for emotional fulfillment.  Alternatives to emotional eating If you're depressed or lonely, call  someone who always makes you feel better, play with your dog or cat, or look at a favorite photo or cherished memento.  If you're anxious, expend your nervous energy by dancing to your favorite song, squeezing a stress ball, or taking a brisk walk.  If you're exhausted, treat yourself with a hot cup of tea, take a bath, light some scented candles, or wrap yourself in a warm blanket.  If you're bored, read a good book, watch a comedy show, explore the outdoors, or turn to an activity you enjoy (woodworking, playing the guitar, shooting hoops, scrapbooking, etc.).  What is mindful eating? Mindful eating is a practice that develops your awareness of eating habits and allows you to pause between your triggers and your actions. Most emotional eaters feel powerless over their food cravings. When the urge to eat hits, you feel an almost unbearable tension that demands to be fed, right now. Because you've tried to resist in the past and failed, you believe that your willpower just isn't up to snuff. But the truth is that you have more power over your cravings than you  think.  Take 5 before you give in to a craving Emotional eating tends to be automatic and virtually mindless. Before you even realize what you're doing, you've reached for a tub of ice cream and polished off half of it. But if you can take a moment to pause and reflect when you're hit with a craving, you give yourself the opportunity to make a different decision.  Can you put off eating for five minutes? Or just start with one minute. Don't tell yourself you can't give in to the craving; remember, the forbidden is extremely tempting. Just tell yourself to wait.  While you're waiting, check in with yourself. How are you feeling? What's going on emotionally? Even if you end up eating, you'll have a better understanding of why you did it. This can help you set yourself up for a different response next time.  How to practice mindful  eating Eating while you're also doing other things-such as watching TV, driving, or playing with your phone-can prevent you from fully enjoying your food. Since your mind is elsewhere, you may not feel satisfied or continue eating even though you're no longer hungry. Eating more mindfully can help focus your mind on your food and the pleasure of a meal and curb overeating.   Eat your meals in a calm place with no distractions, aside from any dining companions.  Try eating with your non-dominant hand or using chopsticks instead of a knife and fork. Eating in such a non-familiar way can slow down how fast you eat and ensure your mind stays focused on your food.  Allow yourself enough time not to have to rush your meal. Set a timer for 20 minutes and pace yourself so you spend at least that much time eating.  Take small bites and chew them well, taking time to notice the different flavors and textures of each mouthful.  Put your utensils down between bites. Take time to consider how you feel-hungry, satiated-before picking up your utensils again.  Try to stop eating before you are full.It takes time for the signal to reach your brain that you've had enough. Don't feel obligated to always clean your plate.  When you've finished your food, take a few moments to assess if you're really still hungry before opting for an extra serving or dessert.  Learn to accept your feelings-even the bad ones  While it may seem that the core problem is that you're powerless over food, emotional eating actually stems from feeling powerless over your emotions. You don't feel capable of dealing with your feelings head on, so you avoid them with food.  Recommended reading  Mini Habits for weight loss  Healthy Eating: A guide to the new nutrition - [employer]  10 Tips for Mindful Eating - How mindfulness can help you fully enjoy a meal and the experience of eating-with moderation  and restraint. ([place])  Weight Loss: Gain Control of Emotional Eating - Tips to regain control of your eating habits. [facility])  Why Stress Causes People to Overeat -Tips on controlling stress eating. ([place] [facility])  Mindful Eating Meditations -Free online mindfulness meditations. ([facility])

## 2017-09-23 LAB — CBC WITH DIFFERENTIAL/PLATELET
BASOS PCT: 0.5 %
Basophils Absolute: 21 cells/uL (ref 0–200)
EOS PCT: 2 %
Eosinophils Absolute: 82 cells/uL (ref 15–500)
HCT: 37.9 % (ref 35.0–45.0)
HEMOGLOBIN: 12.9 g/dL (ref 11.7–15.5)
Lymphs Abs: 1320 cells/uL (ref 850–3900)
MCH: 31.8 pg (ref 27.0–33.0)
MCHC: 34 g/dL (ref 32.0–36.0)
MCV: 93.3 fL (ref 80.0–100.0)
MONOS PCT: 10.7 %
MPV: 10.1 fL (ref 7.5–12.5)
NEUTROS ABS: 2239 {cells}/uL (ref 1500–7800)
Neutrophils Relative %: 54.6 %
PLATELETS: 271 10*3/uL (ref 140–400)
RBC: 4.06 10*6/uL (ref 3.80–5.10)
RDW: 12.5 % (ref 11.0–15.0)
TOTAL LYMPHOCYTE: 32.2 %
WBC mixed population: 439 cells/uL (ref 200–950)
WBC: 4.1 10*3/uL (ref 3.8–10.8)

## 2017-09-23 LAB — COMPLETE METABOLIC PANEL WITH GFR
AG Ratio: 2.1 (calc) (ref 1.0–2.5)
ALKALINE PHOSPHATASE (APISO): 48 U/L (ref 33–130)
ALT: 38 U/L — AB (ref 6–29)
AST: 31 U/L (ref 10–35)
Albumin: 4.5 g/dL (ref 3.6–5.1)
BUN: 11 mg/dL (ref 7–25)
CALCIUM: 10.1 mg/dL (ref 8.6–10.4)
CO2: 25 mmol/L (ref 20–32)
CREATININE: 0.86 mg/dL (ref 0.50–1.05)
Chloride: 101 mmol/L (ref 98–110)
GFR, EST NON AFRICAN AMERICAN: 75 mL/min/{1.73_m2} (ref >=60)
GFR, Est African American: 87 mL/min/{1.73_m2} (ref >=60)
Globulin: 2.1 g/dL (calc) (ref 1.9–3.7)
Glucose, Bld: 91 mg/dL (ref 65–99)
POTASSIUM: 4.5 mmol/L (ref 3.5–5.3)
SODIUM: 137 mmol/L (ref 135–146)
Total Bilirubin: 0.4 mg/dL (ref 0.2–1.2)
Total Protein: 6.6 g/dL (ref 6.1–8.1)

## 2017-09-23 LAB — URINALYSIS, ROUTINE W REFLEX MICROSCOPIC
BILIRUBIN URINE: NEGATIVE
GLUCOSE, UA: NEGATIVE
HGB URINE DIPSTICK: NEGATIVE
Ketones, ur: NEGATIVE
Leukocytes, UA: NEGATIVE
Nitrite: NEGATIVE
Protein, ur: NEGATIVE
Specific Gravity, Urine: 1.015 (ref 1.001–1.03)
pH: 5.5 (ref 5.0–8.0)

## 2017-09-23 LAB — IRON, TOTAL/TOTAL IRON BINDING CAP
%SAT: 31 % (ref 16–45)
IRON: 143 ug/dL (ref 45–160)
TIBC: 468 ug/dL — AB (ref 250–450)

## 2017-09-23 LAB — LIPID PANEL
CHOL/HDL RATIO: 2.8 (calc) (ref <5.0)
CHOLESTEROL: 185 mg/dL (ref <200)
HDL: 67 mg/dL (ref >50)
LDL Cholesterol (Calc): 101 mg/dL (calc) — ABNORMAL HIGH
Non-HDL Cholesterol (Calc): 118 mg/dL (calc) (ref <130)
Triglycerides: 79 mg/dL (ref <150)

## 2017-09-23 LAB — TSH: TSH: 0.59 m[IU]/L (ref 0.40–4.50)

## 2017-09-23 LAB — MICROALBUMIN / CREATININE URINE RATIO: CREATININE, URINE: 52 mg/dL (ref 20–275)

## 2017-09-23 LAB — VITAMIN D 25 HYDROXY (VIT D DEFICIENCY, FRACTURES): VIT D 25 HYDROXY: 63 ng/mL (ref 30–100)

## 2017-09-23 LAB — MAGNESIUM: Magnesium: 2.1 mg/dL (ref 1.5–2.5)

## 2017-10-06 DIAGNOSIS — Z01419 Encounter for gynecological examination (general) (routine) without abnormal findings: Secondary | ICD-10-CM | POA: Diagnosis not present

## 2017-10-06 DIAGNOSIS — Z6829 Body mass index (BMI) 29.0-29.9, adult: Secondary | ICD-10-CM | POA: Diagnosis not present

## 2017-10-06 MED FILL — LINZESS 145 MCG CAPSULE: 145 | 90 days supply | Qty: 90 | Fill #0

## 2017-11-01 ENCOUNTER — Other Ambulatory Visit: Payer: Self-pay | Admitting: Physician Assistant

## 2017-11-01 DIAGNOSIS — K219 Gastro-esophageal reflux disease without esophagitis: Secondary | ICD-10-CM

## 2017-11-01 MED FILL — OMEPRAZOLE 20 MG CPDR: 20 | 90 days supply | Qty: 90 | Fill #0

## 2017-11-01 MED FILL — EZETIMIBE-SIMVAS 10-40 MG T: 10-40 | 90 days supply | Qty: 90 | Fill #0

## 2017-11-01 MED FILL — VIT D2 1.25 MG (50,000 UNIT: 1.25 MG | 84 days supply | Qty: 24 | Fill #1

## 2017-11-18 DIAGNOSIS — D251 Intramural leiomyoma of uterus: Secondary | ICD-10-CM | POA: Diagnosis not present

## 2017-11-18 DIAGNOSIS — R102 Pelvic and perineal pain: Secondary | ICD-10-CM | POA: Diagnosis not present

## 2017-11-24 ENCOUNTER — Other Ambulatory Visit: Payer: Self-pay | Admitting: Physician Assistant

## 2017-11-25 MED FILL — FLUoxetine HCL 20 MG CAPS: 20 | 90 days supply | Qty: 180 | Fill #0

## 2017-12-07 MED FILL — LEVOTHYROXINE 75 MCG TABLET: 75 | 90 days supply | Qty: 90 | Fill #1

## 2017-12-14 ENCOUNTER — Encounter: Payer: Self-pay | Admitting: Adult Health

## 2017-12-14 ENCOUNTER — Emergency Department (HOSPITAL_COMMUNITY): Payer: 59

## 2017-12-14 ENCOUNTER — Other Ambulatory Visit: Payer: Self-pay | Admitting: Internal Medicine

## 2017-12-14 ENCOUNTER — Encounter (HOSPITAL_COMMUNITY): Payer: Self-pay | Admitting: Emergency Medicine

## 2017-12-14 ENCOUNTER — Ambulatory Visit (HOSPITAL_COMMUNITY)
Admission: RE | Admit: 2017-12-14 | Discharge: 2017-12-14 | Disposition: A | Discharge status: Home / Self Care | Payer: 59 | Source: Ambulatory Visit | Attending: Adult Health | Admitting: Adult Health

## 2017-12-14 ENCOUNTER — Inpatient Hospital Stay (HOSPITAL_COMMUNITY)
Admission: EM | Admit: 2017-12-14 | Discharge: 2017-12-17 | DRG: 872 | Disposition: A | Discharge status: Home / Self Care | Payer: 59 | Attending: Internal Medicine | Admitting: Internal Medicine

## 2017-12-14 ENCOUNTER — Ambulatory Visit: Payer: 59 | Admitting: Adult Health

## 2017-12-14 ENCOUNTER — Other Ambulatory Visit: Payer: Self-pay | Admitting: Adult Health

## 2017-12-14 VITALS — BP 104/66 | HR 70 | Temp 98.8°F | Ht 61.5 in | Wt 162.8 lb

## 2017-12-14 DIAGNOSIS — K838 Other specified diseases of biliary tract: Secondary | ICD-10-CM | POA: Diagnosis not present

## 2017-12-14 DIAGNOSIS — N12 Tubulo-interstitial nephritis, not specified as acute or chronic: Secondary | ICD-10-CM | POA: Diagnosis present

## 2017-12-14 DIAGNOSIS — K219 Gastro-esophageal reflux disease without esophagitis: Secondary | ICD-10-CM | POA: Diagnosis not present

## 2017-12-14 DIAGNOSIS — Z1231 Encounter for screening mammogram for malignant neoplasm of breast: Secondary | ICD-10-CM

## 2017-12-14 DIAGNOSIS — R05 Cough: Secondary | ICD-10-CM

## 2017-12-14 DIAGNOSIS — Z8349 Family history of other endocrine, nutritional and metabolic diseases: Secondary | ICD-10-CM

## 2017-12-14 DIAGNOSIS — R945 Abnormal results of liver function studies: Principal | ICD-10-CM

## 2017-12-14 DIAGNOSIS — E785 Hyperlipidemia, unspecified: Secondary | ICD-10-CM | POA: Diagnosis not present

## 2017-12-14 DIAGNOSIS — N10 Acute tubulo-interstitial nephritis: Secondary | ICD-10-CM | POA: Diagnosis not present

## 2017-12-14 DIAGNOSIS — M1990 Unspecified osteoarthritis, unspecified site: Secondary | ICD-10-CM | POA: Diagnosis not present

## 2017-12-14 DIAGNOSIS — Z820 Family history of epilepsy and other diseases of the nervous system: Secondary | ICD-10-CM

## 2017-12-14 DIAGNOSIS — E039 Hypothyroidism, unspecified: Secondary | ICD-10-CM | POA: Diagnosis present

## 2017-12-14 DIAGNOSIS — R509 Fever, unspecified: Secondary | ICD-10-CM

## 2017-12-14 DIAGNOSIS — Z8249 Family history of ischemic heart disease and other diseases of the circulatory system: Secondary | ICD-10-CM

## 2017-12-14 DIAGNOSIS — K831 Obstruction of bile duct: Secondary | ICD-10-CM | POA: Diagnosis not present

## 2017-12-14 DIAGNOSIS — E559 Vitamin D deficiency, unspecified: Secondary | ICD-10-CM | POA: Diagnosis not present

## 2017-12-14 DIAGNOSIS — D649 Anemia, unspecified: Secondary | ICD-10-CM | POA: Diagnosis present

## 2017-12-14 DIAGNOSIS — M549 Dorsalgia, unspecified: Secondary | ICD-10-CM

## 2017-12-14 DIAGNOSIS — Z833 Family history of diabetes mellitus: Secondary | ICD-10-CM

## 2017-12-14 DIAGNOSIS — R6511 Systemic inflammatory response syndrome (SIRS) of non-infectious origin with acute organ dysfunction: Secondary | ICD-10-CM | POA: Diagnosis not present

## 2017-12-14 DIAGNOSIS — J90 Pleural effusion, not elsewhere classified: Secondary | ICD-10-CM | POA: Insufficient documentation

## 2017-12-14 DIAGNOSIS — D6489 Other specified anemias: Secondary | ICD-10-CM | POA: Diagnosis not present

## 2017-12-14 DIAGNOSIS — G8929 Other chronic pain: Secondary | ICD-10-CM | POA: Diagnosis not present

## 2017-12-14 DIAGNOSIS — R109 Unspecified abdominal pain: Secondary | ICD-10-CM

## 2017-12-14 DIAGNOSIS — R7989 Other specified abnormal findings of blood chemistry: Secondary | ICD-10-CM

## 2017-12-14 DIAGNOSIS — Z823 Family history of stroke: Secondary | ICD-10-CM

## 2017-12-14 DIAGNOSIS — R6510 Systemic inflammatory response syndrome (SIRS) of non-infectious origin without acute organ dysfunction: Secondary | ICD-10-CM | POA: Diagnosis present

## 2017-12-14 DIAGNOSIS — E282 Polycystic ovarian syndrome: Secondary | ICD-10-CM | POA: Diagnosis present

## 2017-12-14 DIAGNOSIS — M545 Low back pain: Secondary | ICD-10-CM | POA: Diagnosis not present

## 2017-12-14 DIAGNOSIS — K839 Disease of biliary tract, unspecified: Secondary | ICD-10-CM | POA: Diagnosis not present

## 2017-12-14 DIAGNOSIS — Z79890 Hormone replacement therapy (postmenopausal): Secondary | ICD-10-CM | POA: Diagnosis not present

## 2017-12-14 DIAGNOSIS — Z8262 Family history of osteoporosis: Secondary | ICD-10-CM

## 2017-12-14 DIAGNOSIS — F419 Anxiety disorder, unspecified: Secondary | ICD-10-CM | POA: Diagnosis present

## 2017-12-14 DIAGNOSIS — A4151 Sepsis due to Escherichia coli [E. coli]: Principal | ICD-10-CM | POA: Diagnosis present

## 2017-12-14 DIAGNOSIS — Z888 Allergy status to other drugs, medicaments and biological substances status: Secondary | ICD-10-CM

## 2017-12-14 DIAGNOSIS — R1084 Generalized abdominal pain: Secondary | ICD-10-CM | POA: Diagnosis not present

## 2017-12-14 DIAGNOSIS — Z803 Family history of malignant neoplasm of breast: Secondary | ICD-10-CM

## 2017-12-14 DIAGNOSIS — E079 Disorder of thyroid, unspecified: Secondary | ICD-10-CM | POA: Diagnosis present

## 2017-12-14 LAB — CBC WITH DIFFERENTIAL/PLATELET
Abs Immature Granulocytes: 0.29 10*3/uL — ABNORMAL HIGH (ref 0.00–0.07)
Basophils Absolute: 0.1 10*3/uL (ref 0.0–0.1)
Basophils Relative: 0 %
EOS ABS: 0 10*3/uL (ref 0.0–0.5)
EOS PCT: 0 %
HEMATOCRIT: 34.2 % — AB (ref 36.0–46.0)
Hemoglobin: 11.4 g/dL — ABNORMAL LOW (ref 12.0–15.0)
Immature Granulocytes: 2 %
LYMPHS ABS: 0.9 10*3/uL (ref 0.7–4.0)
Lymphocytes Relative: 7 %
MCH: 31 pg (ref 26.0–34.0)
MCHC: 33.3 g/dL (ref 30.0–36.0)
MCV: 92.9 fL (ref 80.0–100.0)
MONOS PCT: 8 %
Monocytes Absolute: 1.1 10*3/uL — ABNORMAL HIGH (ref 0.1–1.0)
Neutro Abs: 11.6 10*3/uL — ABNORMAL HIGH (ref 1.7–7.7)
Neutrophils Relative %: 83 %
Platelets: 332 10*3/uL (ref 150–400)
RBC: 3.68 MIL/uL — ABNORMAL LOW (ref 3.87–5.11)
RDW: 13.7 % (ref 11.5–15.5)
WBC: 13.9 10*3/uL — ABNORMAL HIGH (ref 4.0–10.5)
nRBC: 0 % (ref 0.0–0.2)

## 2017-12-14 LAB — COMPREHENSIVE METABOLIC PANEL
ALBUMIN: 2.9 g/dL — AB (ref 3.5–5.0)
ALK PHOS: 258 U/L — AB (ref 38–126)
ALT: 287 U/L — AB (ref 0–44)
AST: 86 U/L — ABNORMAL HIGH (ref 15–41)
Anion gap: 13 (ref 5–15)
BILIRUBIN TOTAL: 1.4 mg/dL — AB (ref 0.3–1.2)
BUN: 11 mg/dL (ref 6–20)
CALCIUM: 9.5 mg/dL (ref 8.9–10.3)
CO2: 23 mmol/L (ref 22–32)
CREATININE: 1.22 mg/dL — AB (ref 0.44–1.00)
Chloride: 96 mmol/L — ABNORMAL LOW (ref 98–111)
GFR calc Af Amer: 56 mL/min — ABNORMAL LOW (ref >60)
GFR calc non Af Amer: 48 mL/min — ABNORMAL LOW (ref >60)
GLUCOSE: 101 mg/dL — AB (ref 70–99)
Potassium: 4.2 mmol/L (ref 3.5–5.1)
SODIUM: 132 mmol/L — AB (ref 135–145)
TOTAL PROTEIN: 7.3 g/dL (ref 6.5–8.1)

## 2017-12-14 LAB — I-STAT CG4 LACTIC ACID, ED: Lactic Acid, Venous: 1.36 mmol/L (ref 0.5–1.9)

## 2017-12-14 LAB — I-STAT BETA HCG BLOOD, ED (MC, WL, AP ONLY): I-stat hCG, quantitative: 43.5 m[IU]/mL — ABNORMAL HIGH (ref <5)

## 2017-12-14 LAB — URINALYSIS, ROUTINE W REFLEX MICROSCOPIC
BACTERIA UA: NONE SEEN
Bilirubin Urine: NEGATIVE
GLUCOSE, UA: NEGATIVE mg/dL
Hgb urine dipstick: NEGATIVE
Ketones, ur: 5 mg/dL — AB
Nitrite: NEGATIVE
PH: 6 (ref 5.0–8.0)
PROTEIN: 30 mg/dL — AB
SPECIFIC GRAVITY, URINE: 1.006 (ref 1.005–1.030)

## 2017-12-14 LAB — ACETAMINOPHEN LEVEL: Acetaminophen (Tylenol), Serum: <10 ug/mL — ABNORMAL LOW (ref 10–30)

## 2017-12-14 MED ORDER — METRONIDAZOLE 500 MG PO TABS: 500.0000 mg | ORAL_TABLET | Freq: Three times a day (TID) | ORAL | 0 refills | Status: DC

## 2017-12-14 MED ORDER — IOHEXOL 300 MG/ML  SOLN
100.0000 mL | Freq: Once | INTRAMUSCULAR | Status: AC | PRN
  Administered 2017-12-14: 100 mL via INTRAVENOUS

## 2017-12-14 MED ORDER — SODIUM CHLORIDE 0.9 % IV SOLN
1.0000 g | Freq: Once | INTRAVENOUS | Status: AC
  Administered 2017-12-14: 1 g via INTRAVENOUS
  Filled 2017-12-14: qty 10

## 2017-12-14 MED ORDER — SODIUM CHLORIDE 0.9 % IV BOLUS
1000.0000 mL | Freq: Once | INTRAVENOUS | Status: AC
  Administered 2017-12-14: 1000 mL via INTRAVENOUS

## 2017-12-14 MED ORDER — KETOROLAC TROMETHAMINE 30 MG/ML IJ SOLN
15.0000 mg | Freq: Once | INTRAMUSCULAR | Status: AC
  Administered 2017-12-14: 15 mg via INTRAVENOUS
  Filled 2017-12-14: qty 1

## 2017-12-14 MED ORDER — LEVOFLOXACIN 750 MG PO TABS: 750.0000 mg | ORAL_TABLET | Freq: Every day | ORAL | 0 refills | Status: DC

## 2017-12-14 NOTE — ED Triage Notes (Signed)
Pt began feeling badly on thurs with chills, generalized body aches; took PO tylenol; some SOB with production of sputum with spontaneous cough, hx bronchitis;  Seen at PCP and reportedly told she had abnormal labs and cxr

## 2017-12-14 NOTE — Progress Notes (Signed)
Patient seen today for FUO; labs showed leukocytosis with left shift, severely elevated LFTs/alk phos, pending STAT Abd [place] tomorrow and follow up labs, levaquin and flagyl initiated. Patient called to notify, clear liquids tonight, NPO tomorrow, strict ER precautions given in the interim.

## 2017-12-14 NOTE — ED Notes (Signed)
Pt aware we need urine sample.  

## 2017-12-14 NOTE — ED Provider Notes (Signed)
[facility] EMERGENCY DEPARTMENT Provider Note   CSN: [id number] Arrival date & time: 12/14/17  2007     History   Chief Complaint Chief Complaint  Patient presents with  . Fever  . Fatigue  . Generalized Body Aches  . Chills    HPI Alison Alvarez is a 57 y.o. female with history of anxiety, arthritis, GERD, HLD, PCOS presents for evaluation of acute onset, progressively worsening fevers, myalgias for 5 days.  Associated symptoms include chest tightness, generalized abdominal pain, nasal congestion, headaches.  She had 2 episodes of nonbloody nonbilious emesis on Sunday 3 days ago.  Today had cough productive of clear sputum.  No leg swelling, no recent travel or surgeries, no prior history of DVT or PE.  Not on hormonal placement therapy.  Last period was 9 years ago.  Has been taking 1000 mg Tylenol every 6 hours with mild improvement.  Fever has been worsening over the past 3 days.  Went to her PCP who obtained labs and chest x-ray which showed bilateral pleural effusions.  LFTs were also elevated on lab work performed today.  She is an Therapist, sports for the cancer center.  The history is provided by the patient.    Past Medical History:  Diagnosis Date  . Acne   . Anxiety   . Arthritis   . Cervical polyp   . Chronic back pain   . Depression   . GERD (gastroesophageal reflux disease)   . Hyperlipidemia   . Hypothyroidism, adult    hypothyroidism  . PCO (polycystic ovaries)   . Plantar fasciitis   . Post-menopausal   . Pre-diabetes     Patient Active Problem List   Diagnosis Date Noted  . Environmental allergies 08/07/2014  . Vitamin D deficiency 01/15/2013  . PCO (polycystic ovaries)   . Anxiety   . Hyperlipidemia   . Thyroid disease   . Pre-diabetes   . Chronic back pain   . Acne     Past Surgical History:  Procedure Laterality Date  . BRAIN MENINGIOMA EXCISION  1965   at age 58  . BREAST EXCISIONAL BIOPSY Left   . BREAST MASS EXCISION       left breast, benign  . DIAGNOSTIC LAPAROSCOPY    . SYNOVIAL CYST EXCISION  2010   l5, s1 left hip  . TONSILLECTOMY    . wisom teeth  1984     OB History   None      Home Medications    Prior to Admission medications   Medication Sig Start Date End Date Taking? Authorizing Provider  albuterol (PROVENTIL HFA;VENTOLIN HFA) 108 (90 Base) MCG/ACT inhaler Inhale 2 puffs into the lungs every 6 (six) hours as needed for wheezing or shortness of breath. 03/23/17  Yes Vicie Mutters, PA-C  ALPRAZolam Duanne Moron) 0.25 MG tablet Take 1 tablet (0.25 mg total) by mouth 3 (three) times daily. 09/22/17  Yes Vicie Mutters, PA-C  levothyroxine (SYNTHROID, LEVOTHROID) 75 MCG tablet TAKE 1 TABLET BY MOUTH ONCE DAILY 09/13/17  Yes Vicie Mutters, PA-C  omeprazole (PRILOSEC) 20 MG capsule TAKE 1 CAPSULE BY MOUTH ONCE DAILY 11/01/17  Yes Vicie Mutters, PA-C  spironolactone (ALDACTONE) 100 MG tablet Take 1 tablet (100 mg total) by mouth 2 (two) times daily. Patient taking differently: Take 100 mg by mouth daily.  09/22/17  Yes Vicie Mutters, PA-C  azelastine (ASTELIN) 0.1 % nasal spray Place 2 sprays into both nostrils 2 (two) times daily. Use in each nostril as  directed Patient not taking: Reported on 12/14/2017 12/30/15 12/14/17  Rolene Course, PA-C  ezetimibe-simvastatin (VYTORIN) 10-40 MG tablet TAKE 1 TABLET BY MOUTH AT BEDTIME. Patient not taking: Reported on 12/14/2017 11/01/17   Vicie Mutters, PA-C  fenofibrate micronized (LOFIBRA) 134 MG capsule Take 1 capsule (134 mg total) by mouth daily. Patient not taking: Reported on 12/14/2017 03/23/17   Vicie Mutters, PA-C  FLUoxetine (PROZAC) 20 MG capsule TAKE 1 CAPSULE BY MOUTH TWICE DAILY FOR MOOD Patient not taking: Reported on 12/14/2017 11/25/17   Vicie Mutters, PA-C  HYDROcodone-acetaminophen (NORCO/VICODIN) 5-325 MG per tablet Take 1 tablet by mouth every 6 (six) hours as needed for moderate pain. Patient not taking: Reported on 12/14/2017  12/05/13   Vicie Mutters, PA-C  levofloxacin (LEVAQUIN) 750 MG tablet Take 1 tablet (750 mg total) by mouth daily for 5 days. Patient not taking: Reported on 12/14/2017 12/14/17 12/19/17  Liane Comber, NP  linaclotide [facility]) 145 MCG CAPS capsule Take 1 capsule (145 mcg total) by mouth daily before breakfast. Patient not taking: Reported on 12/14/2017 09/22/17   Vicie Mutters, PA-C  linaclotide [facility]) 72 MCG capsule 1-2 PILLS A DAY Patient not taking: Reported on 12/14/2017 09/22/17   Vicie Mutters, PA-C  metroNIDAZOLE (FLAGYL) 500 MG tablet Take 1 tablet (500 mg total) by mouth 3 (three) times daily. Patient not taking: Reported on 12/14/2017 12/14/17   Liane Comber, NP  mometasone (NASONEX) 50 MCG/ACT nasal spray Place 2 sprays into the nose daily. Patient not taking: Reported on 12/14/2017 03/23/17 04/06/18  Vicie Mutters, PA-C  Vitamin D, Ergocalciferol, (DRISDOL) 50000 units CAPS capsule TAKE 1 CAPSULE BY MOUTH TWICE A WEEK Patient not taking: Reported on 12/14/2017 06/15/17   Liane Comber, NP    Family History Family History  Problem Relation Age of Onset  . Dementia Mother   . Osteoporosis Mother   . Heart disease Father   . Parkinson's disease Father   . Stroke Father   . Diabetes Father   . Hyperlipidemia Father   . Hypertension Father   . Cancer Paternal Aunt        breast  . Breast cancer Paternal Aunt   . Cancer Maternal Grandfather        brain    Social History Social History   Tobacco Use  . Smoking status: Never Smoker  . Smokeless tobacco: Never Used  Substance Use Topics  . Alcohol use: Yes    Comment: occasional alcohol intake  . Drug use: No     Allergies   Advicor [niacin-lovastatin er] and Niacin-lovastatin er   Review of Systems Review of Systems  Constitutional: Positive for chills, fatigue and fever.  Respiratory: Positive for cough, chest tightness and shortness of breath.   Cardiovascular: Negative for chest pain.    Gastrointestinal: Positive for abdominal pain, nausea and vomiting. Negative for blood in stool and constipation.  Genitourinary: Negative for dysuria, frequency, hematuria and urgency.  Musculoskeletal: Positive for myalgias.  Neurological: Positive for headaches. Negative for syncope.  All other systems reviewed and are negative.    Physical Exam Updated Vital Signs BP (!) 109/57   Pulse 72   Temp 99.5 F (37.5 C) (Oral)   Resp (!) 21   SpO2 97%   Physical Exam  Constitutional: She appears well-developed and well-nourished. No distress.  HENT:  Head: Normocephalic and atraumatic.  TMs without erythema or bulging.  Mild frontal sinus tenderness, no maxillary sinus tenderness.  Posterior oropharynx without erythema, tonsillar hypertrophy, exudates, trismus, sublingual abnormalities, or  uvular deviation.  Tolerating secretions without difficulty.  Eyes: Pupils are equal, round, and reactive to light. Conjunctivae and EOM are normal. Right eye exhibits no discharge. Left eye exhibits no discharge.  Neck: Normal range of motion. Neck supple. No JVD present. No neck rigidity. No tracheal deviation present.  Cardiovascular: Normal rate, regular rhythm, normal heart sounds and intact distal pulses.  Pulmonary/Chest: Effort normal and breath sounds normal.  Abdominal: Soft. Bowel sounds are normal. She exhibits no distension. There is tenderness in the right upper quadrant, epigastric area and left upper quadrant. There is no rigidity, no rebound, no guarding, no CVA tenderness, no tenderness at McBurney's point and negative Murphy's sign.  Musculoskeletal: She exhibits no edema.  Neurological: She is alert.  Skin: Skin is warm and dry. No erythema.  Psychiatric: She has a normal mood and affect. Her behavior is normal.  Nursing note and vitals reviewed.    ED Treatments / Results  Labs (all labs ordered are listed, but only abnormal results are displayed) Labs Reviewed   COMPREHENSIVE METABOLIC PANEL - Abnormal; Notable for the following components:      Result Value   Sodium 132 (*)    Chloride 96 (*)    Glucose, Bld 101 (*)    Creatinine, Ser 1.22 (*)    Albumin 2.9 (*)    AST 86 (*)    ALT 287 (*)    Alkaline Phosphatase 258 (*)    Total Bilirubin 1.4 (*)    GFR calc non Af Amer 48 (*)    GFR calc Af Amer 56 (*)    All other components within normal limits  CBC WITH DIFFERENTIAL/PLATELET - Abnormal; Notable for the following components:   WBC 13.9 (*)    RBC 3.68 (*)    Hemoglobin 11.4 (*)    HCT 34.2 (*)    Neutro Abs 11.6 (*)    Monocytes Absolute 1.1 (*)    Abs Immature Granulocytes 0.29 (*)    All other components within normal limits  URINALYSIS, ROUTINE W REFLEX MICROSCOPIC - Abnormal; Notable for the following components:   Ketones, ur 5 (*)    Protein, ur 30 (*)    Leukocytes, UA MODERATE (*)    All other components within normal limits  ACETAMINOPHEN LEVEL - Abnormal; Notable for the following components:   Acetaminophen (Tylenol), Serum <10 (*)    All other components within normal limits  I-STAT BETA HCG BLOOD, ED ([facility], [facility], [facility] ONLY) - Abnormal; Notable for the following components:   I-stat hCG, quantitative 43.5 (*)    All other components within normal limits  CULTURE, BLOOD (ROUTINE X 2)  CULTURE, BLOOD (ROUTINE X 2)  URINE CULTURE  HEPATITIS PANEL, ACUTE  I-STAT CG4 LACTIC ACID, ED    EKG None  Radiology Dg Chest 2 View  Result Date: 12/14/2017 CLINICAL DATA:  Fever and chest tightness.  Cough for 4 days. EXAM: CHEST - 2 VIEW COMPARISON:  08/22/08. FINDINGS: The heart size appears normal. There are small bilateral pleural effusions noted with blunting of the posterior and lateral costophrenic angles. Etiology indeterminate. No airspace opacities identified. The visualized osseous structures are unremarkable. IMPRESSION: 1. Small bilateral pleural effusions are identified. 2. No airspace opacities. Electronically  Signed   By: Kerby Moors M.D.   On: 12/14/2017 12:46   Ct Abdomen Pelvis W Contrast  Result Date: 12/14/2017 CLINICAL DATA:  Generalized abdominal pain and fever since Thursday. EXAM: CT ABDOMEN AND PELVIS WITH CONTRAST TECHNIQUE: Multidetector CT imaging  of the abdomen and pelvis was performed using the standard protocol following bolus administration of intravenous contrast. CONTRAST:  122m OMNIPAQUE IOHEXOL 300 MG/ML  SOLN COMPARISON:  Ultrasound abdomen 11/20/2010 FINDINGS: Lower chest: Normal heart size with trace pericardial effusion posteriorly. Trace bilateral pleural effusions with adjacent atelectasis. Minimal subsegmental linear platelike atelectasis at each lung base. Hepatobiliary: No focal liver abnormality is seen. No gallstones, gallbladder wall thickening, or biliary dilatation. Pancreas: Unremarkable. No pancreatic ductal dilatation or surrounding inflammatory changes. Spleen: Normal in size without focal abnormality. Adrenals/Urinary Tract: Normal bilateral adrenal glands. Mild inhomogeneous slightly striated enhancement of both kidneys, question pyelonephritis. Small exophytic simple cyst off the upper pole of the left kidney measuring 13 mm. Decompressed urinary bladder. Stomach/Bowel: Small hiatal hernia. Decompressed stomach. Normal duodenal sweep and ligament Treitz. Nonobstructed nondistended small bowel. Moderate stool retention within the colon without obstruction or inflammation. The appendix is normal. Vascular/Lymphatic: No significant vascular findings are present. No enlarged abdominal or pelvic lymph nodes. Reproductive: Uterus and bilateral adnexa are unremarkable. Other: Small periumbilical fat containing hernia. No abdominopelvic ascites. Musculoskeletal: No acute or significant osseous findings. IMPRESSION: 1. Trace bilateral pleural effusions with adjacent atelectasis. 2. Mild inhomogeneous, slightly striated enhancement of both kidneys question pyelonephritis. 3. 13 mm  simple cyst off the upper pole the left kidney. 4. Small periumbilical fat containing hernia. Electronically Signed   By: DAshley RoyaltyM.D.   On: 12/14/2017 23:16    Procedures Procedures (including critical care time)  Medications Ordered in ED Medications  sodium chloride 0.9 % bolus 1,000 mL (0 mLs Intravenous Stopped 12/14/17 2229)  ketorolac (TORADOL) 30 MG/ML injection 15 mg (15 mg Intravenous Given 12/14/17 2206)  iohexol (OMNIPAQUE) 300 MG/ML solution 100 mL (100 mLs Intravenous Contrast Given 12/14/17 2233)  cefTRIAXone (ROCEPHIN) 1 g in sodium chloride 0.9 % 100 mL IVPB (1 g Intravenous New Bag/Given 12/14/17 2339)     Initial Impression / Assessment and Plan / ED Course  I have reviewed the triage vital signs and the nursing notes.  Pertinent labs & imaging results that were available during my care of the patient were reviewed by me and considered in my medical decision making (see chart for details).     Patient presenting for evaluation of 5 days of worsening flulike symptoms, myalgias, abdominal pain, cough.  Febrile to 102.4 F with hypotension initially, improved with IV Toradol and IV fluids.  She is ill in appearance though normal lactate.  Lab work significant for leukocytosis of 13.9, mild anemia with generally elevated LFTs (AST 86, ALT 287, alk phos 258) and total bilirubin of 1.4.  UA with ketones, protein, leukoesterase, and WBCs.  No bacteria.  She notes that her urine has been malodorous and "frothy" but denies any other urinary symptoms.  Will culture her urine.  CT abdomen pelvis shows trace bilateral pleural effusions, slightly striated enhancement of both kidneys raising the suggestion of pyelonephritis.  Will obtain right upper quadrant ultrasound for evaluation of elevated LFTs.  We have also obtained an acute hepatitis panel.  12:35 AM Signed out to oncoming provider PA [place]. Pending RUQ [place] May require general surgery consultation depending on imaging.   Otherwise, would likely benefit from hospitalist admission with GI evaluation in the morning.  May require MRCP or HIDA scan for further evaluation of acutely elevated LFTs.  Final Clinical Impressions(s) / ED Diagnoses   Final diagnoses:  LFT elevation  Pleural effusion, bilateral    ED Discharge Orders    None  Renita Papa, PA-C 12/15/17 0038    Hayden Rasmussen, MD 12/15/17 [phone]

## 2017-12-14 NOTE — Progress Notes (Signed)
Assessment and Plan:  Alison Alvarez was seen today for fever, generalized body aches, neck pain and emesis.  Diagnoses and all orders for this visit:  Fever of unknown origin Check CXR r/o walking pneumonia, hx of recurrent bronchitis, recent URI symptoms Check labs, discussed abd imaging if CXR clear due to nonspecific abd tenderness Recommended clear liquid diet for now, strong ER precautions given tonight Follow up pending labs  -     CBC with Differential/Platelet -     COMPLETE METABOLIC PANEL WITH GFR -     Urinalysis w microscopic + reflex cultur -     Lipase -     Amylase -     DG Chest 2 View; Future  Further disposition pending results of labs. Discussed med's effects and SE's.   Over 30 minutes of exam, counseling, chart review, and critical decision making was performed.   Future Appointments  Date Time Provider [place]  04/13/2018  2:30 PM Vicie Mutters, PA-C [facility] None  09/28/2018  3:00 PM Vicie Mutters, PA-C [facility] None    ------------------------------------------------------------------------------------------------------------------   HPI BP 104/66   Pulse 70   Temp 98.8 F (37.1 C)   Ht 5' 1.5" (1.562 m)   Wt 162 lb 12.8 oz (73.8 kg)   SpO2 96%   BMI 30.26 kg/m   57 y.o.female presents for evaluation of fever and body aches that started 5 days ago at work; she reports feeling vaguely unwell, feeling difficult to take a deep breath, did have a mild temp of 99, had rigors that night, took tylenol which improved until the next AM and had more rigors. Has been taking 1000 mg tylenol q6h which has been helping her feel somewhat better. She reports mild frontal headache, some bilateral ear pressure. She reports poor appetite but no abdominal pain, nausea. She does report some lower back pain, urine has been frothy but denies dysuria, frequency, urgency. Last night had temp of 100.8 last night and 101 this AM. She endorses prior to onset she had nasal  congestion with green discharge. She endorses fatigue. She reports non-productive cough triggered by attempts at deep breaths.   No recent travel, no tick exposure, doesn't have pets or spend lots of time outdoors.  she does have recurrent hx of bronchitis and has breathing treatment. She had leftover ventolin which she tried but didn't find that this helped.   No abdominal imaging available to review, colonoscopy normal in 2012   Past Medical History:  Diagnosis Date  . Acne   . Anxiety   . Arthritis   . Cervical polyp   . Chronic back pain   . Depression   . GERD (gastroesophageal reflux disease)   . Hyperlipidemia   . Hypothyroidism, adult    hypothyroidism  . PCO (polycystic ovaries)   . Plantar fasciitis   . Post-menopausal   . Pre-diabetes      Allergies  Allergen Reactions  . Advicor [Niacin-Lovastatin Er] Swelling    Facial swelling  . Niacin-Lovastatin Er Swelling and Other (See Comments)    Extreme flushing    Current Outpatient Medications on File Prior to Visit  Medication Sig  . albuterol (PROVENTIL HFA;VENTOLIN HFA) 108 (90 Base) MCG/ACT inhaler Inhale 2 puffs into the lungs every 6 (six) hours as needed for wheezing or shortness of breath.  . ALPRAZolam (XANAX) 0.25 MG tablet Take 1 tablet (0.25 mg total) by mouth 3 (three) times daily.  Marland Kitchen azelastine (ASTELIN) 0.1 % nasal spray Place 2 sprays into  both nostrils 2 (two) times daily. Use in each nostril as directed (Patient taking differently: Place 2 sprays into both nostrils as needed. Use in each nostril as directed)  . Bioflavonoid Products (PERIDIN-C PO) Take 200 mg by mouth. Peridin c 2 tabs three times a day for hot flashes   . Coenzyme Q10 (CO Q 10 PO) Take 200 mg by mouth daily.    Marland Kitchen ezetimibe-simvastatin (VYTORIN) 10-40 MG tablet TAKE 1 TABLET BY MOUTH AT BEDTIME.  . fenofibrate micronized (LOFIBRA) 134 MG capsule Take 1 capsule (134 mg total) by mouth daily.  Marland Kitchen FLUoxetine (PROZAC) 20 MG capsule TAKE  1 CAPSULE BY MOUTH TWICE DAILY FOR MOOD  . HYDROcodone-acetaminophen (NORCO/VICODIN) 5-325 MG per tablet Take 1 tablet by mouth every 6 (six) hours as needed for moderate pain.  Marland Kitchen ibuprofen (ADVIL) 200 MG tablet Take 200 mg by mouth every 6 (six) hours as needed.    Marland Kitchen levothyroxine (SYNTHROID, LEVOTHROID) 75 MCG tablet TAKE 1 TABLET BY MOUTH ONCE DAILY  . linaclotide (LINZESS) 145 MCG CAPS capsule Take 1 capsule (145 mcg total) by mouth daily before breakfast.  . linaclotide (LINZESS) 72 MCG capsule 1-2 PILLS A DAY  . loratadine (CLARITIN) 10 MG tablet Take 10 mg by mouth as needed.    . mometasone (NASONEX) 50 MCG/ACT nasal spray Place 2 sprays into the nose daily.  . Multiple Vitamin (MULTIVITAMIN) tablet Take 1 tablet by mouth daily.    Marland Kitchen omeprazole (PRILOSEC) 20 MG capsule TAKE 1 CAPSULE BY MOUTH ONCE DAILY  . spironolactone (ALDACTONE) 100 MG tablet Take 1 tablet (100 mg total) by mouth 2 (two) times daily. (Patient taking differently: Take 100 mg by mouth daily. )  . Vitamin D, Ergocalciferol, (DRISDOL) 50000 units CAPS capsule TAKE 1 CAPSULE BY MOUTH TWICE A WEEK   No current facility-administered medications on file prior to visit.     ROS: Review of Systems  Constitutional: Positive for chills, fever and malaise/fatigue. Negative for diaphoresis and weight loss.  HENT: Positive for congestion. Negative for ear discharge, ear pain, hearing loss, nosebleeds, sinus pain, sore throat and tinnitus.   Eyes: Negative for blurred vision, double vision, photophobia, pain, discharge and redness.  Respiratory: Positive for cough. Negative for hemoptysis, sputum production, shortness of breath, wheezing and stridor.   Cardiovascular: Positive for chest pain (tightness, can't take a deep breath). Negative for palpitations, orthopnea, claudication, leg swelling and PND.  Gastrointestinal: Negative for abdominal pain, blood in stool, constipation, diarrhea, heartburn, melena, nausea and vomiting.   Genitourinary: Positive for flank pain. Negative for dysuria, frequency, hematuria and urgency.  Musculoskeletal: Positive for back pain and myalgias. Negative for falls, joint pain and neck pain.  Skin: Negative for itching and rash.  Neurological: Positive for headaches (mild, frontal/temporal). Negative for dizziness, tingling, tremors, sensory change, speech change, focal weakness, seizures and weakness.  Psychiatric/Behavioral: Negative for memory loss and substance abuse. The patient is not nervous/anxious.      Physical Exam:  BP 104/66   Pulse 70   Temp 98.8 F (37.1 C)   Ht 5' 1.5" (1.562 m)   Wt 162 lb 12.8 oz (73.8 kg)   SpO2 96%   BMI 30.26 kg/m   General Appearance: Well nourished, in no apparent distress. Eyes: PERRLA, EOMs, conjunctiva no swelling or erythema Sinuses: No Frontal/maxillary tenderness ENT/Mouth: Ext aud canals clear, TMs without erythema, bulging. No erythema, swelling, or exudate on post pharynx.  Tonsils not swollen or erythematous. Hearing normal.  Neck: Supple, thyroid normal.  Respiratory: Respiratory effort normal, BS with rhonchi to LLL, ? Diminished in bilateral based without rales,  wheezing or stridor.  Cardio: RRR with no MRGs. Brisk peripheral pulses without edema.  Abdomen: Soft, + BS.  Generalized tenderness, guarding with epigastric and RUQ palpation initially, no rebound, hernias, palpable masses. Lymphatics: Non tender without lymphadenopathy.  Musculoskeletal: Full ROM, 5/5 strength, normal gait. No CVA tenderness Skin: Warm, dry without rashes, lesions, ecchymosis.  Neuro: Cranial nerves intact. Normal muscle tone, no cerebellar symptoms. Sensation intact.  Psych: Awake and oriented X 3, normal affect, Insight and Judgment appropriate.   Izora Ribas, NP 11:18 AM [facility]

## 2017-12-14 NOTE — Patient Instructions (Addendum)
Recommend sticking to clear liquid diet until we have more information  Please to to the ER for any progressive/worsening/severe symptoms    Fever, Adult A fever is an increase in the body's temperature. It is usually defined as a temperature of 100F (38C) or higher. Brief mild or moderate fevers generally have no long-term effects, and they often do not require treatment. Moderate or high fevers may make you feel uncomfortable and can sometimes be a sign of a serious illness or disease. The sweating that may occur with repeated or prolonged fever may also cause dehydration. Fever is confirmed by taking a temperature with a thermometer. A measured temperature can vary with:  Age.  Time of day.  Location of the thermometer: ? Mouth (oral). ? Rectum (rectal). ? Ear (tympanic). ? Underarm (axillary). ? Forehead (temporal).  Follow these instructions at home: Pay attention to any changes in your symptoms. Take these actions to help with your condition:  Take over-the counter and prescription medicines only as told by your health care provider. Follow the dosing instructions carefully.  If you were prescribed an antibiotic medicine, take it as told by your health care provider. Do not stop taking the antibiotic even if you start to feel better.  Rest as needed.  Drink enough fluid to keep your urine clear or pale yellow. This helps to prevent dehydration.  Sponge yourself or bathe with room-temperature water to help reduce your body temperature as needed. Do not use ice water.  Do not overbundle yourself in blankets or heavy clothes.  Contact a health care provider if:  You vomit.  You cannot eat or drink without vomiting.  You have diarrhea.  You have pain when you urinate.  Your symptoms do not improve with treatment.  You develop new symptoms.  You develop excessive weakness. Get help right away if:  You have shortness of breath or have trouble breathing.  You  are dizzy or you faint.  You are disoriented or confused.  You develop signs of dehydration, such as a dry mouth, decreased urination, or paleness.  You develop severe pain in your abdomen.  You have persistent vomiting or diarrhea.  You develop a skin rash.  Your symptoms suddenly get worse. This information is not intended to replace advice given to you by your health care provider. Make sure you discuss any questions you have with your health care provider. Document Released: 07/08/2000 Document Revised: 06/20/2015 Document Reviewed: 03/08/2014 Elsevier Interactive Patient Education  [employer].

## 2017-12-15 ENCOUNTER — Ambulatory Visit: Payer: Self-pay

## 2017-12-15 ENCOUNTER — Other Ambulatory Visit: Payer: 59

## 2017-12-15 ENCOUNTER — Emergency Department (HOSPITAL_COMMUNITY): Payer: 59

## 2017-12-15 ENCOUNTER — Other Ambulatory Visit: Payer: Self-pay

## 2017-12-15 ENCOUNTER — Inpatient Hospital Stay (HOSPITAL_COMMUNITY): Payer: 59

## 2017-12-15 DIAGNOSIS — D649 Anemia, unspecified: Secondary | ICD-10-CM | POA: Diagnosis present

## 2017-12-15 DIAGNOSIS — N12 Tubulo-interstitial nephritis, not specified as acute or chronic: Secondary | ICD-10-CM | POA: Diagnosis present

## 2017-12-15 DIAGNOSIS — Z820 Family history of epilepsy and other diseases of the nervous system: Secondary | ICD-10-CM | POA: Diagnosis not present

## 2017-12-15 DIAGNOSIS — E282 Polycystic ovarian syndrome: Secondary | ICD-10-CM | POA: Diagnosis present

## 2017-12-15 DIAGNOSIS — Z803 Family history of malignant neoplasm of breast: Secondary | ICD-10-CM | POA: Diagnosis not present

## 2017-12-15 DIAGNOSIS — G8929 Other chronic pain: Secondary | ICD-10-CM | POA: Diagnosis present

## 2017-12-15 DIAGNOSIS — N10 Acute tubulo-interstitial nephritis: Secondary | ICD-10-CM | POA: Diagnosis not present

## 2017-12-15 DIAGNOSIS — K838 Other specified diseases of biliary tract: Secondary | ICD-10-CM | POA: Diagnosis not present

## 2017-12-15 DIAGNOSIS — Z8249 Family history of ischemic heart disease and other diseases of the circulatory system: Secondary | ICD-10-CM | POA: Diagnosis not present

## 2017-12-15 DIAGNOSIS — F419 Anxiety disorder, unspecified: Secondary | ICD-10-CM | POA: Diagnosis present

## 2017-12-15 DIAGNOSIS — M1990 Unspecified osteoarthritis, unspecified site: Secondary | ICD-10-CM | POA: Diagnosis present

## 2017-12-15 DIAGNOSIS — R6510 Systemic inflammatory response syndrome (SIRS) of non-infectious origin without acute organ dysfunction: Secondary | ICD-10-CM | POA: Diagnosis not present

## 2017-12-15 DIAGNOSIS — M545 Low back pain: Secondary | ICD-10-CM | POA: Diagnosis not present

## 2017-12-15 DIAGNOSIS — Z8349 Family history of other endocrine, nutritional and metabolic diseases: Secondary | ICD-10-CM | POA: Diagnosis not present

## 2017-12-15 DIAGNOSIS — J90 Pleural effusion, not elsewhere classified: Secondary | ICD-10-CM | POA: Diagnosis not present

## 2017-12-15 DIAGNOSIS — Z8262 Family history of osteoporosis: Secondary | ICD-10-CM | POA: Diagnosis not present

## 2017-12-15 DIAGNOSIS — Z79890 Hormone replacement therapy (postmenopausal): Secondary | ICD-10-CM | POA: Diagnosis not present

## 2017-12-15 DIAGNOSIS — Z823 Family history of stroke: Secondary | ICD-10-CM | POA: Diagnosis not present

## 2017-12-15 DIAGNOSIS — E039 Hypothyroidism, unspecified: Secondary | ICD-10-CM | POA: Diagnosis present

## 2017-12-15 DIAGNOSIS — K219 Gastro-esophageal reflux disease without esophagitis: Secondary | ICD-10-CM | POA: Diagnosis present

## 2017-12-15 DIAGNOSIS — R935 Abnormal findings on diagnostic imaging of other abdominal regions, including retroperitoneum: Secondary | ICD-10-CM | POA: Diagnosis not present

## 2017-12-15 DIAGNOSIS — K839 Disease of biliary tract, unspecified: Secondary | ICD-10-CM | POA: Diagnosis not present

## 2017-12-15 DIAGNOSIS — A4151 Sepsis due to Escherichia coli [E. coli]: Secondary | ICD-10-CM | POA: Diagnosis present

## 2017-12-15 DIAGNOSIS — R6511 Systemic inflammatory response syndrome (SIRS) of non-infectious origin with acute organ dysfunction: Secondary | ICD-10-CM | POA: Diagnosis not present

## 2017-12-15 DIAGNOSIS — E559 Vitamin D deficiency, unspecified: Secondary | ICD-10-CM | POA: Diagnosis present

## 2017-12-15 DIAGNOSIS — Z833 Family history of diabetes mellitus: Secondary | ICD-10-CM | POA: Diagnosis not present

## 2017-12-15 DIAGNOSIS — Z888 Allergy status to other drugs, medicaments and biological substances status: Secondary | ICD-10-CM | POA: Diagnosis not present

## 2017-12-15 DIAGNOSIS — E785 Hyperlipidemia, unspecified: Secondary | ICD-10-CM | POA: Diagnosis present

## 2017-12-15 DIAGNOSIS — N281 Cyst of kidney, acquired: Secondary | ICD-10-CM | POA: Diagnosis not present

## 2017-12-15 LAB — CBC
HEMATOCRIT: 28.9 % — AB (ref 36.0–46.0)
HEMOGLOBIN: 9.8 g/dL — AB (ref 12.0–15.0)
MCH: 30.9 pg (ref 26.0–34.0)
MCHC: 33.9 g/dL (ref 30.0–36.0)
MCV: 91.2 fL (ref 80.0–100.0)
NRBC: 0 % (ref 0.0–0.2)
PLATELETS: 279 10*3/uL (ref 150–400)
RBC: 3.17 MIL/uL — AB (ref 3.87–5.11)
RDW: 13.7 % (ref 11.5–15.5)
WBC: 14.3 10*3/uL — ABNORMAL HIGH (ref 4.0–10.5)

## 2017-12-15 LAB — RESPIRATORY PANEL BY PCR
Adenovirus: NOT DETECTED
BORDETELLA PERTUSSIS-RVPCR: NOT DETECTED
CHLAMYDOPHILA PNEUMONIAE-RVPPCR: NOT DETECTED
CORONAVIRUS HKU1-RVPPCR: NOT DETECTED
Coronavirus 229E: NOT DETECTED
Coronavirus NL63: NOT DETECTED
Coronavirus OC43: NOT DETECTED
INFLUENZA A-RVPPCR: NOT DETECTED
Influenza B: NOT DETECTED
METAPNEUMOVIRUS-RVPPCR: NOT DETECTED
Mycoplasma pneumoniae: NOT DETECTED
PARAINFLUENZA VIRUS 2-RVPPCR: NOT DETECTED
PARAINFLUENZA VIRUS 3-RVPPCR: NOT DETECTED
PARAINFLUENZA VIRUS 4-RVPPCR: NOT DETECTED
Parainfluenza Virus 1: NOT DETECTED
RESPIRATORY SYNCYTIAL VIRUS-RVPPCR: NOT DETECTED
RHINOVIRUS / ENTEROVIRUS - RVPPCR: NOT DETECTED

## 2017-12-15 LAB — BLOOD CULTURE ID PANEL (REFLEXED)
ACINETOBACTER BAUMANNII: NOT DETECTED
CANDIDA GLABRATA: NOT DETECTED
CARBAPENEM RESISTANCE: NOT DETECTED
Candida albicans: NOT DETECTED
Candida krusei: NOT DETECTED
Candida parapsilosis: NOT DETECTED
Candida tropicalis: NOT DETECTED
ENTEROBACTERIACEAE SPECIES: DETECTED — AB
ENTEROCOCCUS SPECIES: NOT DETECTED
ESCHERICHIA COLI: DETECTED — AB
Enterobacter cloacae complex: NOT DETECTED
Haemophilus influenzae: NOT DETECTED
Klebsiella oxytoca: NOT DETECTED
Klebsiella pneumoniae: NOT DETECTED
Listeria monocytogenes: NOT DETECTED
NEISSERIA MENINGITIDIS: NOT DETECTED
PSEUDOMONAS AERUGINOSA: NOT DETECTED
Proteus species: NOT DETECTED
SERRATIA MARCESCENS: NOT DETECTED
STREPTOCOCCUS AGALACTIAE: NOT DETECTED
STREPTOCOCCUS PNEUMONIAE: NOT DETECTED
STREPTOCOCCUS PYOGENES: NOT DETECTED
Staphylococcus aureus (BCID): NOT DETECTED
Staphylococcus species: NOT DETECTED
Streptococcus species: NOT DETECTED

## 2017-12-15 LAB — BASIC METABOLIC PANEL
Anion gap: 12 (ref 5–15)
BUN: 13 mg/dL (ref 6–20)
CO2: 22 mmol/L (ref 22–32)
Calcium: 8.6 mg/dL — ABNORMAL LOW (ref 8.9–10.3)
Chloride: 102 mmol/L (ref 98–111)
Creatinine, Ser: 1.41 mg/dL — ABNORMAL HIGH (ref 0.44–1.00)
GFR calc Af Amer: 47 mL/min — ABNORMAL LOW (ref >60)
GFR calc non Af Amer: 40 mL/min — ABNORMAL LOW (ref >60)
GLUCOSE: 104 mg/dL — AB (ref 70–99)
POTASSIUM: 3.9 mmol/L (ref 3.5–5.1)
SODIUM: 136 mmol/L (ref 135–145)

## 2017-12-15 LAB — HEPATIC FUNCTION PANEL
ALBUMIN: 2.4 g/dL — AB (ref 3.5–5.0)
ALK PHOS: 217 U/L — AB (ref 38–126)
ALT: 201 U/L — ABNORMAL HIGH (ref 0–44)
AST: 51 U/L — ABNORMAL HIGH (ref 15–41)
BILIRUBIN INDIRECT: 0.6 mg/dL (ref 0.3–0.9)
Bilirubin, Direct: 0.4 mg/dL — ABNORMAL HIGH (ref 0.0–0.2)
TOTAL PROTEIN: 6.3 g/dL — AB (ref 6.5–8.1)
Total Bilirubin: 1 mg/dL (ref 0.3–1.2)

## 2017-12-15 LAB — INFLUENZA PANEL BY PCR (TYPE A & B)
Influenza A By PCR: NEGATIVE
Influenza B By PCR: NEGATIVE

## 2017-12-15 MED ORDER — METHOCARBAMOL 500 MG PO TABS
500.0000 mg | ORAL_TABLET | Freq: Three times a day (TID) | ORAL | Status: DC | PRN
  Administered 2017-12-15: 500 mg via ORAL
  Filled 2017-12-15 (×2): qty 1

## 2017-12-15 MED ORDER — ALBUTEROL SULFATE (2.5 MG/3ML) 0.083% IN NEBU: 3.0000 mL | INHALATION_SOLUTION | Freq: Four times a day (QID) | RESPIRATORY_TRACT | Status: DC | PRN

## 2017-12-15 MED ORDER — SODIUM CHLORIDE 0.9 % IV SOLN
INTRAVENOUS | Status: AC
  Administered 2017-12-15 – 2017-12-16 (×4): via INTRAVENOUS

## 2017-12-15 MED ORDER — SODIUM CHLORIDE 0.9 % IV SOLN
INTRAVENOUS | Status: DC
  Administered 2017-12-15 (×2): via INTRAVENOUS

## 2017-12-15 MED ORDER — PANTOPRAZOLE SODIUM 40 MG PO TBEC
40.0000 mg | DELAYED_RELEASE_TABLET | Freq: Every day | ORAL | Status: DC
  Administered 2017-12-15 – 2017-12-17 (×3): 40 mg via ORAL
  Filled 2017-12-15 (×3): qty 1

## 2017-12-15 MED ORDER — SODIUM CHLORIDE 0.9 % IV SOLN
2.0000 g | INTRAVENOUS | Status: DC
  Administered 2017-12-15 – 2017-12-16 (×2): 2 g via INTRAVENOUS
  Filled 2017-12-15 (×3): qty 20

## 2017-12-15 MED ORDER — SODIUM CHLORIDE 0.9 % IV SOLN
Freq: Once | INTRAVENOUS | Status: AC
  Administered 2017-12-15: 01:00:00 via INTRAVENOUS

## 2017-12-15 MED ORDER — SODIUM CHLORIDE 0.9 % IV SOLN
3.0000 g | Freq: Four times a day (QID) | INTRAVENOUS | Status: DC
  Administered 2017-12-15: 3 g via INTRAVENOUS
  Filled 2017-12-15 (×3): qty 3

## 2017-12-15 MED ORDER — ACETAMINOPHEN 325 MG PO TABS
650.0000 mg | ORAL_TABLET | Freq: Four times a day (QID) | ORAL | Status: DC | PRN
  Administered 2017-12-16 – 2017-12-17 (×2): 650 mg via ORAL
  Filled 2017-12-15 (×3): qty 2

## 2017-12-15 MED ORDER — ONDANSETRON HCL 4 MG/2ML IJ SOLN: 4.0000 mg | Freq: Four times a day (QID) | INTRAMUSCULAR | Status: DC | PRN

## 2017-12-15 MED ORDER — ALPRAZOLAM 0.25 MG PO TABS
0.2500 mg | ORAL_TABLET | Freq: Three times a day (TID) | ORAL | Status: DC
  Filled 2017-12-15 (×3): qty 1

## 2017-12-15 MED ORDER — LEVOTHYROXINE SODIUM 75 MCG PO TABS
75.0000 ug | ORAL_TABLET | Freq: Every day | ORAL | Status: DC
  Administered 2017-12-15 – 2017-12-17 (×3): 75 ug via ORAL
  Filled 2017-12-15 (×3): qty 1

## 2017-12-15 MED ORDER — ONDANSETRON HCL 4 MG PO TABS: 4.0000 mg | ORAL_TABLET | Freq: Four times a day (QID) | ORAL | Status: DC | PRN

## 2017-12-15 MED ORDER — IBUPROFEN 800 MG PO TABS: 800.0000 mg | ORAL_TABLET | Freq: Four times a day (QID) | ORAL | Status: DC | PRN

## 2017-12-15 MED ORDER — GADOBUTROL 1 MMOL/ML IV SOLN
7.0000 mL | Freq: Once | INTRAVENOUS | Status: AC | PRN
  Administered 2017-12-15: 7 mL via INTRAVENOUS

## 2017-12-15 NOTE — Progress Notes (Signed)
[facility] PLAN OF CARE NOTE Patient: Alison Alvarez TXH:[id number]   PCP: Unk Pinto, MD DOB: 20-Oct-1960   DOA: 12/14/2017   DOS: 12/15/2017    Patient was admitted by my colleague Dr. Shanon Brow earlier on 12/15/2017. I have reviewed the H&P as well as assessment and plan and agree with the same. Important changes in the plan are listed below.  Plan of care: Principal Problem:   SIRS (systemic inflammatory response syndrome) (HCC) Active Problems:   Anxiety   Thyroid disease   Chronic back pain   Common bile duct dilatation   Pleural effusion, bilateral Sepsis present on admission secondary to E. coli bacteremia. Changing IV antibiotics to ceftriaxone. MRCP negative for any choledocholithiasis  Author: Berle Mull, MD [facility] Pager: [phone] 12/15/2017 5:21 PM   If 7PM-7AM, please contact night-coverage at [email or web address], password [facility]

## 2017-12-15 NOTE — Progress Notes (Signed)
Temp noted 100.1 and patient c/o headache. Patient refused motrin due to being NPO.

## 2017-12-15 NOTE — Progress Notes (Signed)
PHARMACY - PHYSICIAN COMMUNICATION CRITICAL VALUE ALERT - BLOOD CULTURE IDENTIFICATION (BCID)  Alison Alvarez is an 57 y.o. female who presented to [facility] on 12/14/2017 with a chief complaint of fevers x 5d  Assessment: Patient came febrile for the past several days. She was started on ceftriaxone in the ED before transitioned Unasyn. Labs just called with BCID results of e.coli. Rounding pharmacist d/w Dr. Posey Pronto and will change abx to ceftriaxone until sens are back. No carbapenem gene detected.  Name of physician (or Provider) Contacted: Dr. Posey Pronto  Current antibiotics: Unasyn  Changes to prescribed antibiotics recommended:  Ceftriaxone 2g IV q24  Results for orders placed or performed during the hospital encounter of 12/14/17  Blood Culture ID Panel (Reflexed) (Collected: 12/14/2017  8:23 PM)  Result Value Ref Range   Enterococcus species NOT DETECTED NOT DETECTED   Listeria monocytogenes NOT DETECTED NOT DETECTED   Staphylococcus species NOT DETECTED NOT DETECTED   Staphylococcus aureus (BCID) NOT DETECTED NOT DETECTED   Streptococcus species NOT DETECTED NOT DETECTED   Streptococcus agalactiae NOT DETECTED NOT DETECTED   Streptococcus pneumoniae NOT DETECTED NOT DETECTED   Streptococcus pyogenes NOT DETECTED NOT DETECTED   Acinetobacter baumannii NOT DETECTED NOT DETECTED   Enterobacteriaceae species DETECTED (A) NOT DETECTED   Enterobacter cloacae complex NOT DETECTED NOT DETECTED   Escherichia coli DETECTED (A) NOT DETECTED   Klebsiella oxytoca NOT DETECTED NOT DETECTED   Klebsiella pneumoniae NOT DETECTED NOT DETECTED   Proteus species NOT DETECTED NOT DETECTED   Serratia marcescens NOT DETECTED NOT DETECTED   Carbapenem resistance NOT DETECTED NOT DETECTED   Haemophilus influenzae NOT DETECTED NOT DETECTED   Neisseria meningitidis NOT DETECTED NOT DETECTED   Pseudomonas aeruginosa NOT DETECTED NOT DETECTED   Candida albicans NOT DETECTED NOT DETECTED   Candida  glabrata NOT DETECTED NOT DETECTED   Candida krusei NOT DETECTED NOT DETECTED   Candida parapsilosis NOT DETECTED NOT DETECTED   Candida tropicalis NOT DETECTED NOT DETECTED   Onnie Boer, PharmD, BCIDP, AAHIVP, CPP Infectious Disease Pharmacist 12/15/2017 11:29 AM

## 2017-12-15 NOTE — H&P (Signed)
History and Physical    Alison Alvarez NAT:[id number] DOB: 05-04-1960 DOA: 12/14/2017  PCP: Unk Pinto, MD  Patient coming from: Home  Chief Complaint: Fevers for 5 days  HPI: Alison Alvarez is a 57 y.o. female with medical history significant of anxiety and hyperlipidemia with chronic back pain comes in with 5 days of myalgias with fevers with associated nausea and vomiting.  She denies any abdominal pain she denies any flank pain.  She denies any dysuria or any urinary symptoms.  She is only vomited once.  She has coughed for about a day.  She has been eating mainly soups.  She denies any rashes.  She denies any headaches.  Patient be referred for admission for fever she has multiple abnormalities on her work-up in the emergency department including pleural effusions along with a common bile duct dilation and imaging with possible pyelonephritis.  Patient is feeling much better after IV fluids in the ED.  Review of Systems: As per HPI otherwise 10 point review of systems negative.   Past Medical History:  Diagnosis Date  . Acne   . Anxiety   . Arthritis   . Cervical polyp   . Chronic back pain   . Depression   . GERD (gastroesophageal reflux disease)   . Hyperlipidemia   . Hypothyroidism, adult    hypothyroidism  . PCO (polycystic ovaries)   . Plantar fasciitis   . Post-menopausal   . Pre-diabetes     Past Surgical History:  Procedure Laterality Date  . BRAIN MENINGIOMA EXCISION  1965   at age 72  . BREAST EXCISIONAL BIOPSY Left   . BREAST MASS EXCISION     left breast, benign  . DIAGNOSTIC LAPAROSCOPY    . SYNOVIAL CYST EXCISION  2010   l5, s1 left hip  . TONSILLECTOMY    . wisom teeth  1984     reports that she has never smoked. She has never used smokeless tobacco. She reports that she drinks alcohol. She reports that she does not use drugs.  Allergies  Allergen Reactions  . Advicor [Niacin-Lovastatin Er] Swelling    Facial swelling  .  Niacin-Lovastatin Er Swelling and Other (See Comments)    Extreme flushing    Family History  Problem Relation Age of Onset  . Dementia Mother   . Osteoporosis Mother   . Heart disease Father   . Parkinson's disease Father   . Stroke Father   . Diabetes Father   . Hyperlipidemia Father   . Hypertension Father   . Cancer Paternal Aunt        breast  . Breast cancer Paternal Aunt   . Cancer Maternal Grandfather        brain    Prior to Admission medications   Medication Sig Start Date End Date Taking? Authorizing Provider  albuterol (PROVENTIL HFA;VENTOLIN HFA) 108 (90 Base) MCG/ACT inhaler Inhale 2 puffs into the lungs every 6 (six) hours as needed for wheezing or shortness of breath. 03/23/17  Yes Vicie Mutters, PA-C  ALPRAZolam Duanne Moron) 0.25 MG tablet Take 1 tablet (0.25 mg total) by mouth 3 (three) times daily. 09/22/17  Yes Vicie Mutters, PA-C  levothyroxine (SYNTHROID, LEVOTHROID) 75 MCG tablet TAKE 1 TABLET BY MOUTH ONCE DAILY 09/13/17  Yes Vicie Mutters, PA-C  omeprazole (PRILOSEC) 20 MG capsule TAKE 1 CAPSULE BY MOUTH ONCE DAILY 11/01/17  Yes Vicie Mutters, PA-C  spironolactone (ALDACTONE) 100 MG tablet Take 1 tablet (100 mg total) by mouth 2 (two)  times daily. Patient taking differently: Take 100 mg by mouth daily.  09/22/17  Yes Vicie Mutters, PA-C  azelastine (ASTELIN) 0.1 % nasal spray Place 2 sprays into both nostrils 2 (two) times daily. Use in each nostril as directed Patient not taking: Reported on 12/14/2017 12/30/15 12/14/17  Rolene Course, PA-C  ezetimibe-simvastatin (VYTORIN) 10-40 MG tablet TAKE 1 TABLET BY MOUTH AT BEDTIME. Patient not taking: Reported on 12/14/2017 11/01/17   Vicie Mutters, PA-C  fenofibrate micronized (LOFIBRA) 134 MG capsule Take 1 capsule (134 mg total) by mouth daily. Patient not taking: Reported on 12/14/2017 03/23/17   Vicie Mutters, PA-C  FLUoxetine (PROZAC) 20 MG capsule TAKE 1 CAPSULE BY MOUTH TWICE DAILY FOR MOOD Patient not  taking: Reported on 12/14/2017 11/25/17   Vicie Mutters, PA-C  HYDROcodone-acetaminophen (NORCO/VICODIN) 5-325 MG per tablet Take 1 tablet by mouth every 6 (six) hours as needed for moderate pain. Patient not taking: Reported on 12/14/2017 12/05/13   Vicie Mutters, PA-C  levofloxacin (LEVAQUIN) 750 MG tablet Take 1 tablet (750 mg total) by mouth daily for 5 days. Patient not taking: Reported on 12/14/2017 12/14/17 12/19/17  Liane Comber, NP  linaclotide [facility]) 145 MCG CAPS capsule Take 1 capsule (145 mcg total) by mouth daily before breakfast. Patient not taking: Reported on 12/14/2017 09/22/17   Vicie Mutters, PA-C  linaclotide [facility]) 72 MCG capsule 1-2 PILLS A DAY Patient not taking: Reported on 12/14/2017 09/22/17   Vicie Mutters, PA-C  metroNIDAZOLE (FLAGYL) 500 MG tablet Take 1 tablet (500 mg total) by mouth 3 (three) times daily. Patient not taking: Reported on 12/14/2017 12/14/17   Liane Comber, NP  mometasone (NASONEX) 50 MCG/ACT nasal spray Place 2 sprays into the nose daily. Patient not taking: Reported on 12/14/2017 03/23/17 04/06/18  Vicie Mutters, PA-C  Vitamin D, Ergocalciferol, (DRISDOL) 50000 units CAPS capsule TAKE 1 CAPSULE BY MOUTH TWICE A WEEK Patient not taking: Reported on 12/14/2017 06/15/17   Liane Comber, NP    Physical Exam: Vitals:   12/15/17 0045 12/15/17 0100 12/15/17 0115 12/15/17 0223  BP: (!) 106/56 110/62 (!) 114/55 112/60  Pulse: 75 73 72 76  Resp: (!) 31 (!) 31  18  Temp:      TempSrc:      SpO2: 95% 97% 97% 98%      Constitutional: NAD, calm, comfortable Vitals:   12/15/17 0045 12/15/17 0100 12/15/17 0115 12/15/17 0223  BP: (!) 106/56 110/62 (!) 114/55 112/60  Pulse: 75 73 72 76  Resp: (!) 31 (!) 31  18  Temp:      TempSrc:      SpO2: 95% 97% 97% 98%   Eyes: PERRL, lids and conjunctivae normal ENMT: Mucous membranes are moist. Posterior pharynx clear of any exudate or lesions.Normal dentition.  Neck: normal, supple, no  masses, no thyromegaly Respiratory: clear to auscultation bilaterally, no wheezing, no crackles. Normal respiratory effort. No accessory muscle use.  Cardiovascular: Regular rate and rhythm, no murmurs / rubs / gallops. No extremity edema. 2+ pedal pulses. No carotid bruits.  Abdomen: no tenderness, no masses palpated. No hepatosplenomegaly. Bowel sounds positive.  Musculoskeletal: no clubbing / cyanosis. No joint deformity upper and lower extremities. Good ROM, no contractures. Normal muscle tone.  Skin: no rashes, lesions, ulcers. No induration Neurologic: CN 2-12 grossly intact. Sensation intact, DTR normal. Strength 5/5 in all 4.  Psychiatric: Normal judgment and insight. Alert and oriented x 3. Normal mood.    Labs on Admission: I have personally reviewed following labs and imaging studies  CBC: Recent Labs  Lab 12/14/17 1146 12/14/17 2024  WBC 12.9* 13.9*  NEUTROABS 11,171* 11.6*  HGB 11.2* 11.4*  HCT 33.4* 34.2*  MCV 94.1 92.9  PLT 309 175   Basic Metabolic Panel: Recent Labs  Lab 12/14/17 1146 12/14/17 2024  NA 132* 132*  K 4.7 4.2  CL 96* 96*  CO2 26 23  GLUCOSE 105* 101*  BUN 14 11  CREATININE 1.19* 1.22*  CALCIUM 9.3 9.5   GFR: Estimated Creatinine Clearance: 47.3 mL/min (A) (by C-G formula based on SCr of 1.22 mg/dL (H)). Liver Function Tests: Recent Labs  Lab 12/14/17 1146 12/14/17 2024  AST 111* 86*  ALT 325* 287*  ALKPHOS  --  258*  BILITOT 1.2 1.4*  PROT 6.2 7.3  ALBUMIN  --  2.9*   Recent Labs  Lab 12/14/17 1146  LIPASE 24  AMYLASE 17*   No results for input(s): AMMONIA in the last 168 hours. Coagulation Profile: No results for input(s): INR, PROTIME in the last 168 hours. Cardiac Enzymes: No results for input(s): CKTOTAL, CKMB, CKMBINDEX, TROPONINI in the last 168 hours. BNP (last 3 results) No results for input(s): PROBNP in the last 8760 hours. HbA1C: No results for input(s): HGBA1C in the last 72 hours. CBG: No results for  input(s): GLUCAP in the last 168 hours. Lipid Profile: No results for input(s): CHOL, HDL, LDLCALC, TRIG, CHOLHDL, LDLDIRECT in the last 72 hours. Thyroid Function Tests: No results for input(s): TSH, T4TOTAL, FREET4, T3FREE, THYROIDAB in the last 72 hours. Anemia Panel: No results for input(s): VITAMINB12, FOLATE, FERRITIN, TIBC, IRON, RETICCTPCT in the last 72 hours. Urine analysis:    Component Value Date/Time   COLORURINE YELLOW 12/14/2017 2239   APPEARANCEUR CLEAR 12/14/2017 2239   LABSPEC 1.006 12/14/2017 2239   PHURINE 6.0 12/14/2017 2239   GLUCOSEU NEGATIVE 12/14/2017 2239   HGBUR NEGATIVE 12/14/2017 2239   BILIRUBINUR NEGATIVE 12/14/2017 2239   KETONESUR 5 (A) 12/14/2017 2239   PROTEINUR 30 (A) 12/14/2017 2239   UROBILINOGEN 0.2 08/07/2014 0953   NITRITE NEGATIVE 12/14/2017 2239   LEUKOCYTESUR MODERATE (A) 12/14/2017 2239   Sepsis Labs: !!!!!!!!!!!!!!!!!!!!!!!!!!!!!!!!!!!!!!!!!!!! @LABRCNTIP (procalcitonin:4,lacticidven:4) )No results found for this or any previous visit (from the past 240 hour(s)).   Radiological Exams on Admission: Dg Chest 2 View  Result Date: 12/14/2017 CLINICAL DATA:  Fever and chest tightness.  Cough for 4 days. EXAM: CHEST - 2 VIEW COMPARISON:  08/22/08. FINDINGS: The heart size appears normal. There are small bilateral pleural effusions noted with blunting of the posterior and lateral costophrenic angles. Etiology indeterminate. No airspace opacities identified. The visualized osseous structures are unremarkable. IMPRESSION: 1. Small bilateral pleural effusions are identified. 2. No airspace opacities. Electronically Signed   By: Kerby Moors M.D.   On: 12/14/2017 12:46   Ct Abdomen Pelvis W Contrast  Result Date: 12/14/2017 CLINICAL DATA:  Generalized abdominal pain and fever since Thursday. EXAM: CT ABDOMEN AND PELVIS WITH CONTRAST TECHNIQUE: Multidetector CT imaging of the abdomen and pelvis was performed using the standard protocol following  bolus administration of intravenous contrast. CONTRAST:  187mL OMNIPAQUE IOHEXOL 300 MG/ML  SOLN COMPARISON:  Ultrasound abdomen 11/20/2010 FINDINGS: Lower chest: Normal heart size with trace pericardial effusion posteriorly. Trace bilateral pleural effusions with adjacent atelectasis. Minimal subsegmental linear platelike atelectasis at each lung base. Hepatobiliary: No focal liver abnormality is seen. No gallstones, gallbladder wall thickening, or biliary dilatation. Pancreas: Unremarkable. No pancreatic ductal dilatation or surrounding inflammatory changes. Spleen: Normal in size without focal abnormality. Adrenals/Urinary Tract:  Normal bilateral adrenal glands. Mild inhomogeneous slightly striated enhancement of both kidneys, question pyelonephritis. Small exophytic simple cyst off the upper pole of the left kidney measuring 13 mm. Decompressed urinary bladder. Stomach/Bowel: Small hiatal hernia. Decompressed stomach. Normal duodenal sweep and ligament Treitz. Nonobstructed nondistended small bowel. Moderate stool retention within the colon without obstruction or inflammation. The appendix is normal. Vascular/Lymphatic: No significant vascular findings are present. No enlarged abdominal or pelvic lymph nodes. Reproductive: Uterus and bilateral adnexa are unremarkable. Other: Small periumbilical fat containing hernia. No abdominopelvic ascites. Musculoskeletal: No acute or significant osseous findings. IMPRESSION: 1. Trace bilateral pleural effusions with adjacent atelectasis. 2. Mild inhomogeneous, slightly striated enhancement of both kidneys question pyelonephritis. 3. 13 mm simple cyst off the upper pole the left kidney. 4. Small periumbilical fat containing hernia. Electronically Signed   By: Ashley Royalty M.D.   On: 12/14/2017 23:16   US Abdomen Limited Ruq  Result Date: 12/15/2017 CLINICAL DATA:  LFT elevation EXAM: ULTRASOUND ABDOMEN LIMITED RIGHT UPPER QUADRANT COMPARISON:  None. FINDINGS:  Gallbladder: No gallstones or wall thickening visualized. No sonographic Murphy sign noted by sonographer. Common bile duct: Diameter: 7.3 mm Liver: No focal lesion identified. Within normal limits in parenchymal echogenicity. Portal vein is patent on color Doppler imaging with normal direction of blood flow towards the liver. IMPRESSION: Mild common bile duct dilatation of uncertain etiology. Recommend correlation with labs. An MRCP or ERCP could better evaluate the common bile duct if clinically warranted. Electronically Signed   By: Dorise Bullion III M.D   On: 12/15/2017 00:35    Old chart reviewed Case discussed with EDP X-ray reviewed with small bilateral pleural effusions  Assessment/Plan 57 year old female with 5 days of fevers with unclear source Principal Problem:   SIRS (systemic inflammatory response syndrome) (HCC)-lactic acid level mildly elevated.  Patient given Rocephin in the emergency department to cover for pyelonephritis.  Patient really is not focal with any of her symptoms.  Will placed on Unasyn for now.  Obtain MRCP of her abdomen pelvis for her dilated common bile duct.  Follow-up on blood cultures and urine culture.  She does not really have any symptoms of pyelonephritis nor stone.  Check influenza.  Check procalcitonin level.  Follow-up on lactic acid level.  Active Problems:   Anxiety-noted stable   Thyroid disease-continue Synthroid   Chronic back pain-stable   Common bile duct dilatation-obtain MRCP   Pleural effusion, bilateral-possible pneumonia covered with Unasyn check influenza     DVT prophylaxis: SCDs Code Status: Full Family Communication: None Disposition Plan: 1 to 4 days Consults called: None Admission status: Admission   Itzelle Gains A MD [facility]  If 7PM-7AM, please contact night-coverage [email or web address] Password [email or web address]  12/15/2017, 2:57 AM

## 2017-12-15 NOTE — Progress Notes (Signed)
Pharmacy Antibiotic Note  Alison Alvarez is a 57 y.o. female admitted on 12/14/2017 with intra-abdominal infection.  Pharmacy has been consulted for Unasyn dosing.  Plan: Unasyn 3g IV Q6H.  Temp (24hrs), Avg:100.8 F (38.2 C), Min:98.8 F (37.1 C), Max:102.4 F (39.1 C)  Recent Labs  Lab 12/14/17 1146 12/14/17 2024 12/14/17 2030  WBC 12.9* 13.9*  --   CREATININE 1.19* 1.22*  --   LATICACIDVEN  --   --  1.36    Estimated Creatinine Clearance: 47.3 mL/min (A) (by C-G formula based on SCr of 1.22 mg/dL (H)).    Allergies  Allergen Reactions  . Advicor [Niacin-Lovastatin Er] Swelling    Facial swelling  . Niacin-Lovastatin Er Swelling and Other (See Comments)    Extreme flushing     Thank you for allowing pharmacy to be a part of this patient's care.  Wynona Neat, PharmD, BCPS  12/15/2017 3:16 AM

## 2017-12-15 NOTE — ED Provider Notes (Signed)
Flu like sxs, abd pain CXR pleural effusions Elevated LFT Pending [place] RUQ  RUQ [place] - if + for chole, surgical consult, hosp admission  If negative, consider admission for fever, transaminitis. ?Pyelonephritis (Rocephin ordered).  [place] results show no evidence cholelithiasis or cholecystitis but does show CBD dilatation of uncertain etiology. ?ERCP/MRCP.   1:25 - Recheck of the patient: she remains comfortable. Fever reduced to 99.5. No needs currently. Discussed admission to the hospital and she is in agreement. Hospitalist paged for admission.  Discussed with Dr. Shanon Brow who accepts the patient for admission. Appreciate her help with her care.   Charlann Lange, PA-C 12/15/17 0217    Ripley Fraise, MD 12/15/17 [phone]

## 2017-12-16 DIAGNOSIS — M545 Low back pain: Secondary | ICD-10-CM

## 2017-12-16 DIAGNOSIS — R6511 Systemic inflammatory response syndrome (SIRS) of non-infectious origin with acute organ dysfunction: Secondary | ICD-10-CM

## 2017-12-16 DIAGNOSIS — N10 Acute tubulo-interstitial nephritis: Secondary | ICD-10-CM

## 2017-12-16 DIAGNOSIS — A4151 Sepsis due to Escherichia coli [E. coli]: Principal | ICD-10-CM

## 2017-12-16 DIAGNOSIS — D6489 Other specified anemias: Secondary | ICD-10-CM

## 2017-12-16 LAB — COMPREHENSIVE METABOLIC PANEL
ALT: 135 U/L — AB (ref 0–44)
AST: 37 U/L (ref 15–41)
Albumin: 2.2 g/dL — ABNORMAL LOW (ref 3.5–5.0)
Alkaline Phosphatase: 192 U/L — ABNORMAL HIGH (ref 38–126)
Anion gap: 7 (ref 5–15)
BUN: 13 mg/dL (ref 6–20)
CO2: 23 mmol/L (ref 22–32)
CREATININE: 1.12 mg/dL — AB (ref 0.44–1.00)
Calcium: 8.1 mg/dL — ABNORMAL LOW (ref 8.9–10.3)
Chloride: 109 mmol/L (ref 98–111)
GFR calc Af Amer: >60 mL/min (ref >60)
GFR, EST NON AFRICAN AMERICAN: 53 mL/min — AB (ref >60)
GLUCOSE: 117 mg/dL — AB (ref 70–99)
Potassium: 3.7 mmol/L (ref 3.5–5.1)
Sodium: 139 mmol/L (ref 135–145)
Total Bilirubin: 0.8 mg/dL (ref 0.3–1.2)
Total Protein: 5.4 g/dL — ABNORMAL LOW (ref 6.5–8.1)

## 2017-12-16 LAB — URINALYSIS W MICROSCOPIC + REFLEX CULTURE
BILIRUBIN URINE: NEGATIVE
Bacteria, UA: NONE SEEN /HPF
GLUCOSE, UA: NEGATIVE
Hyaline Cast: NONE SEEN /LPF
KETONES UR: NEGATIVE
NITRITES URINE, INITIAL: NEGATIVE
SPECIFIC GRAVITY, URINE: 1.009 (ref 1.001–1.03)
pH: 6.5 (ref 5.0–8.0)

## 2017-12-16 LAB — COMPLETE METABOLIC PANEL WITH GFR
AG RATIO: 1.4 (calc) (ref 1.0–2.5)
ALBUMIN MSPROF: 3.6 g/dL (ref 3.6–5.1)
ALKALINE PHOSPHATASE (APISO): 278 U/L — AB (ref 33–130)
ALT: 325 U/L — ABNORMAL HIGH (ref 6–29)
AST: 111 U/L — AB (ref 10–35)
BUN/Creatinine Ratio: 12 (calc) (ref 6–22)
BUN: 14 mg/dL (ref 7–25)
CHLORIDE: 96 mmol/L — AB (ref 98–110)
CO2: 26 mmol/L (ref 20–32)
CREATININE: 1.19 mg/dL — AB (ref 0.50–1.05)
Calcium: 9.3 mg/dL (ref 8.6–10.4)
GFR, Est African American: 59 mL/min/{1.73_m2} — ABNORMAL LOW (ref >=60)
GFR, Est Non African American: 51 mL/min/{1.73_m2} — ABNORMAL LOW (ref >=60)
GLOBULIN: 2.6 g/dL (ref 1.9–3.7)
Glucose, Bld: 105 mg/dL — ABNORMAL HIGH (ref 65–99)
POTASSIUM: 4.7 mmol/L (ref 3.5–5.3)
Sodium: 132 mmol/L — ABNORMAL LOW (ref 135–146)
Total Bilirubin: 1.2 mg/dL (ref 0.2–1.2)
Total Protein: 6.2 g/dL (ref 6.1–8.1)

## 2017-12-16 LAB — CBC WITH DIFFERENTIAL/PLATELET
ABS IMMATURE GRANULOCYTES: 0.52 10*3/uL — AB (ref 0.00–0.07)
BASOS ABS: 26 {cells}/uL (ref 0–200)
BASOS PCT: 0 %
Basophils Absolute: 0 10*3/uL (ref 0.0–0.1)
Basophils Relative: 0.2 %
EOS ABS: 52 {cells}/uL (ref 15–500)
Eosinophils Absolute: 0.1 10*3/uL (ref 0.0–0.5)
Eosinophils Relative: 0.4 %
Eosinophils Relative: 0 %
HCT: 26.7 % — ABNORMAL LOW (ref 36.0–46.0)
HEMATOCRIT: 33.4 % — AB (ref 35.0–45.0)
HEMOGLOBIN: 11.2 g/dL — AB (ref 11.7–15.5)
Hemoglobin: 8.7 g/dL — ABNORMAL LOW (ref 12.0–15.0)
IMMATURE GRANULOCYTES: 5 %
LYMPHS ABS: 658 {cells}/uL — AB (ref 850–3900)
LYMPHS ABS: 1.2 10*3/uL (ref 0.7–4.0)
Lymphocytes Relative: 10 %
MCH: 31.5 pg (ref 27.0–33.0)
MCH: 30.2 pg (ref 26.0–34.0)
MCHC: 33.5 g/dL (ref 32.0–36.0)
MCHC: 32.6 g/dL (ref 30.0–36.0)
MCV: 94.1 fL (ref 80.0–100.0)
MCV: 92.7 fL (ref 80.0–100.0)
MONOS PCT: 8 %
MPV: 9.6 fL (ref 7.5–12.5)
Monocytes Absolute: 0.9 10*3/uL (ref 0.1–1.0)
Monocytes Relative: 7.7 %
NEUTROS ABS: 9 10*3/uL — AB (ref 1.7–7.7)
NEUTROS PCT: 77 %
Neutro Abs: 11171 cells/uL — ABNORMAL HIGH (ref 1500–7800)
Neutrophils Relative %: 86.6 %
Platelets: 309 10*3/uL (ref 140–400)
Platelets: 313 10*3/uL (ref 150–400)
RBC: 3.55 10*6/uL — AB (ref 3.80–5.10)
RBC: 2.88 MIL/uL — ABNORMAL LOW (ref 3.87–5.11)
RDW: 12.8 % (ref 11.0–15.0)
RDW: 14 % (ref 11.5–15.5)
TOTAL LYMPHOCYTE: 5.1 %
WBC: 12.9 10*3/uL — ABNORMAL HIGH (ref 3.8–10.8)
WBC: 11.7 10*3/uL — ABNORMAL HIGH (ref 4.0–10.5)
WBCMIX: 993 {cells}/uL — AB (ref 200–950)
nRBC: 0 % (ref 0.0–0.2)

## 2017-12-16 LAB — LIPASE: Lipase: 24 U/L (ref 7–60)

## 2017-12-16 LAB — HEPATITIS PANEL, ACUTE
HCV Ab: <0.1 s/co ratio (ref 0.0–0.9)
Hep A IgM: NEGATIVE
Hep B C IgM: NEGATIVE
Hepatitis B Surface Ag: NEGATIVE

## 2017-12-16 LAB — MAGNESIUM: MAGNESIUM: 2 mg/dL (ref 1.7–2.4)

## 2017-12-16 LAB — AMYLASE: Amylase: 17 U/L — ABNORMAL LOW (ref 21–101)

## 2017-12-16 LAB — URINE CULTURE
MICRO NUMBER: 91398621
SPECIMEN QUALITY:: ADEQUATE

## 2017-12-16 LAB — CULTURE INDICATED

## 2017-12-16 MED ORDER — ALPRAZOLAM 0.25 MG PO TABS
0.2500 mg | ORAL_TABLET | Freq: Three times a day (TID) | ORAL | Status: DC | PRN
  Administered 2017-12-16: 0.25 mg via ORAL
  Filled 2017-12-16: qty 1

## 2017-12-16 NOTE — Progress Notes (Signed)
[facility] Progress Note  Patient: Alison Alvarez GUR:[id number]   PCP: Unk Pinto, MD DOB: 19-Apr-1960   DOA: 12/14/2017   DOS: 12/16/2017   Date of Service: the patient was seen and examined on 12/16/2017  Brief hospital course: Pt. with PMH of anxiety and dyslipidemia and chronic back pain; admitted on 12/14/2017, presented with complaint of fever and chills, was found to have pyelonephritis and E. coli bacteremia. Currently further plan is continue IV antibiotics.  Subjective: Feeling better but still feels dizzy.  No nausea no vomiting no fever.  No chills.  Back pain is improving.  Assessment and Plan: 1.  Sepsis secondary to E. coli bacteremia and pyelonephritis. Continue with IV ceftriaxone. Culture sensitivity currently pending. Creatinine improving.  Still feels dizzy.  Sepsis physiology appears to have resolved. Sepsis present on admission. No hydronephrosis no renal stone on CT scan. No recent procedures as well. We will continue with IV fluids for today and likely can stop them tomorrow.  2.  Chronic anxiety. Continue Xanax as needed.  3.  Chronic back pain. Continue Robaxin and Tylenol.  4. Anemia dilutional.  Etiology not clear for now.  No active bleeding. We will check anemia panel tomorrow.     Component Value Date/Time   HGB 8.7 (L) 12/16/2017 0220   HCT 26.7 (L) 12/16/2017 0220   5.  Elevated LFT. Likely secondary to sepsis. CT abdomen, ultrasound abdomen as well as MRCP negative for any acute abnormality.  No evidence of cholecystitis no choledocholithiasis. We will monitor.  Diet: regular diet DVT Prophylaxis: subcutaneous Heparin  Advance goals of care discussion: full; code  Family Communication: no family was present at bedside, at the time of interview.   Disposition:  Discharge to home.  Consultants: none Procedures: none  Scheduled Meds: . levothyroxine  75 mcg Oral QAC breakfast  . pantoprazole  40 mg Oral Daily    Continuous Infusions: . sodium chloride 125 mL/hr at 12/16/17 1512  . cefTRIAXone (ROCEPHIN)  IV 2 g (12/16/17 1210)   PRN Meds: acetaminophen, albuterol, ALPRAZolam, methocarbamol, ondansetron **OR** ondansetron (ZOFRAN) IV Antibiotics: Anti-infectives (From admission, onward)   Start     Dose/Rate Route Frequency Ordered Stop   12/15/17 1200  cefTRIAXone (ROCEPHIN) 2 g in sodium chloride 0.9 % 100 mL IVPB     2 g 200 mL/hr over 30 Minutes Intravenous Every 24 hours 12/15/17 1125     12/15/17 0600  Ampicillin-Sulbactam (UNASYN) 3 g in sodium chloride 0.9 % 100 mL IVPB  Status:  Discontinued     3 g 200 mL/hr over 30 Minutes Intravenous Every 6 hours 12/15/17 0318 12/15/17 1125   12/14/17 2330  cefTRIAXone (ROCEPHIN) 1 g in sodium chloride 0.9 % 100 mL IVPB     1 g 200 mL/hr over 30 Minutes Intravenous  Once 12/14/17 2326 12/15/17 0122       Objective: Physical Exam: Vitals:   12/15/17 2317 12/15/17 2354 12/16/17 0817 12/16/17 1817  BP: (!) 86/35 (!) 99/48 (!) 114/59 (!) 99/52  Pulse: 72  63 68  Resp: 20     Temp: 98.7 F (37.1 C)  99 F (37.2 C) 98.9 F (37.2 C)  TempSrc: Oral  Oral Oral  SpO2: 97%  98% 100%  Weight:      Height:        Intake/Output Summary (Last 24 hours) at 12/16/2017 1931 Last data filed at 12/16/2017 1700 Gross per 24 hour  Intake 100 ml  Output -  Net 100 ml  Filed Weights   12/15/17 0322  Weight: 72.1 kg   General: Alert, Awake and Oriented to Time, Place and Person. Appear in no distress, affect appropriate Eyes: PERRL, Conjunctiva normal ENT: Oral Mucosa clear moist. Neck: no JVD, no Abnormal Mass Or lumps Cardiovascular: S1 and S2 Present, no Murmur, Peripheral Pulses Present Respiratory: normal respiratory effort, Bilateral Air entry equal and Decreased, no use of accessory muscle, Clear to Auscultation, no Crackles, no wheezes Abdomen: Bowel Sound present, Soft and no tenderness, no hernia Skin: non redness, on Rash, ono  induration Extremities: no Pedal edema, no calf tenderness Neurologic: Grossly no focal neuro deficit. Bilaterally Equal motor strength  Data Reviewed: CBC: Recent Labs  Lab 12/14/17 1146 12/14/17 2024 12/15/17 0547 12/16/17 0220  WBC 12.9* 13.9* 14.3* 11.7*  NEUTROABS 11,171* 11.6*  --  9.0*  HGB 11.2* 11.4* 9.8* 8.7*  HCT 33.4* 34.2* 28.9* 26.7*  MCV 94.1 92.9 91.2 92.7  PLT 309 332 279 638   Basic Metabolic Panel: Recent Labs  Lab 12/14/17 1146 12/14/17 2024 12/15/17 0547 12/16/17 0220  NA 132* 132* 136 139  K 4.7 4.2 3.9 3.7  CL 96* 96* 102 109  CO2 26 23 22 23   GLUCOSE 105* 101* 104* 117*  BUN 14 11 13 13   CREATININE 1.19* 1.22* 1.41* 1.12*  CALCIUM 9.3 9.5 8.6* 8.1*  MG  --   --   --  2.0    Liver Function Tests: Recent Labs  Lab 12/14/17 1146 12/14/17 2024 12/15/17 0547 12/16/17 0220  AST 111* 86* 51* 37  ALT 325* 287* 201* 135*  ALKPHOS  --  258* 217* 192*  BILITOT 1.2 1.4* 1.0 0.8  PROT 6.2 7.3 6.3* 5.4*  ALBUMIN  --  2.9* 2.4* 2.2*   Recent Labs  Lab 12/14/17 1146  LIPASE 24  AMYLASE 17*   No results for input(s): AMMONIA in the last 168 hours. Coagulation Profile: No results for input(s): INR, PROTIME in the last 168 hours. Cardiac Enzymes: No results for input(s): CKTOTAL, CKMB, CKMBINDEX, TROPONINI in the last 168 hours. BNP (last 3 results) No results for input(s): PROBNP in the last 8760 hours. CBG: No results for input(s): GLUCAP in the last 168 hours. Studies: No results found.   Time spent: 35 minutes  Author: Berle Mull, MD [facility] Pager: [phone] 12/16/2017 7:31 PM  Between 7PM-7AM, please contact night-coverage at [email or web address], password [facility]

## 2017-12-17 LAB — LACTATE DEHYDROGENASE: LDH: 124 U/L (ref 98–192)

## 2017-12-17 LAB — COMPREHENSIVE METABOLIC PANEL
ALT: 112 U/L — AB (ref 0–44)
AST: 33 U/L (ref 15–41)
Albumin: 2.3 g/dL — ABNORMAL LOW (ref 3.5–5.0)
Alkaline Phosphatase: 166 U/L — ABNORMAL HIGH (ref 38–126)
Anion gap: 8 (ref 5–15)
BILIRUBIN TOTAL: 0.6 mg/dL (ref 0.3–1.2)
BUN: 11 mg/dL (ref 6–20)
CHLORIDE: 105 mmol/L (ref 98–111)
CO2: 25 mmol/L (ref 22–32)
CREATININE: 0.94 mg/dL (ref 0.44–1.00)
Calcium: 8.9 mg/dL (ref 8.9–10.3)
Glucose, Bld: 112 mg/dL — ABNORMAL HIGH (ref 70–99)
Potassium: 4.1 mmol/L (ref 3.5–5.1)
Sodium: 138 mmol/L (ref 135–145)
TOTAL PROTEIN: 6 g/dL — AB (ref 6.5–8.1)

## 2017-12-17 LAB — VITAMIN B12: VITAMIN B 12: 1876 pg/mL — AB (ref 180–914)

## 2017-12-17 LAB — IRON AND TIBC
Iron: 44 ug/dL (ref 28–170)
Saturation Ratios: 18 % (ref 10.4–31.8)
TIBC: 248 ug/dL — ABNORMAL LOW (ref 250–450)
UIBC: 204 ug/dL

## 2017-12-17 LAB — URINE CULTURE: Culture: 20000 — AB

## 2017-12-17 LAB — FIBRINOGEN

## 2017-12-17 LAB — CBC WITH DIFFERENTIAL/PLATELET
Abs Immature Granulocytes: 0.38 10*3/uL — ABNORMAL HIGH (ref 0.00–0.07)
BASOS PCT: 0 %
Basophils Absolute: 0 10*3/uL (ref 0.0–0.1)
EOS ABS: 0.1 10*3/uL (ref 0.0–0.5)
Eosinophils Relative: 1 %
HEMATOCRIT: 28.1 % — AB (ref 36.0–46.0)
Hemoglobin: 8.9 g/dL — ABNORMAL LOW (ref 12.0–15.0)
IMMATURE GRANULOCYTES: 5 %
LYMPHS ABS: 1.1 10*3/uL (ref 0.7–4.0)
Lymphocytes Relative: 14 %
MCH: 30 pg (ref 26.0–34.0)
MCHC: 31.7 g/dL (ref 30.0–36.0)
MCV: 94.6 fL (ref 80.0–100.0)
MONO ABS: 0.5 10*3/uL (ref 0.1–1.0)
Monocytes Relative: 7 %
NEUTROS PCT: 73 %
Neutro Abs: 5.8 10*3/uL (ref 1.7–7.7)
PLATELETS: 378 10*3/uL (ref 150–400)
RBC: 2.97 MIL/uL — ABNORMAL LOW (ref 3.87–5.11)
RDW: 14.6 % (ref 11.5–15.5)
WBC: 7.9 10*3/uL (ref 4.0–10.5)
nRBC: 0 % (ref 0.0–0.2)

## 2017-12-17 LAB — RETICULOCYTES
Immature Retic Fract: 27.4 % — ABNORMAL HIGH (ref 2.3–15.9)
RBC.: 2.97 MIL/uL — AB (ref 3.87–5.11)
RETIC COUNT ABSOLUTE: 27.6 10*3/uL (ref 19.0–186.0)
RETIC CT PCT: 0.9 % (ref 0.4–3.1)

## 2017-12-17 LAB — CULTURE, BLOOD (ROUTINE X 2): SPECIAL REQUESTS: ADEQUATE

## 2017-12-17 LAB — TYPE AND SCREEN
ABO/RH(D): A NEG
Antibody Screen: NEGATIVE

## 2017-12-17 LAB — ABO/RH: ABO/RH(D): A NEG

## 2017-12-17 LAB — PROTIME-INR
INR: 1.03
PROTHROMBIN TIME: 13.4 s (ref 11.4–15.2)

## 2017-12-17 LAB — FERRITIN: Ferritin: 382 ng/mL — ABNORMAL HIGH (ref 11–307)

## 2017-12-17 MED ORDER — CEFDINIR 300 MG PO CAPS
300.0000 mg | ORAL_CAPSULE | Freq: Two times a day (BID) | ORAL | Status: DC
  Administered 2017-12-17: 300 mg via ORAL
  Filled 2017-12-17: qty 1

## 2017-12-17 MED ORDER — ACETAMINOPHEN 500 MG PO TABS: 500.0000 mg | ORAL_TABLET | Freq: Four times a day (QID) | ORAL | Status: DC | PRN

## 2017-12-17 MED ORDER — SPIRONOLACTONE 100 MG PO TABS: 100.0000 mg | ORAL_TABLET | Freq: Two times a day (BID) | ORAL | 1 refills | Status: DC

## 2017-12-17 MED ORDER — CEFDINIR 300 MG PO CAPS: 300.0000 mg | ORAL_CAPSULE | Freq: Two times a day (BID) | ORAL | 0 refills | Status: DC

## 2017-12-17 MED ORDER — METHOCARBAMOL 500 MG PO TABS: 500.0000 mg | ORAL_TABLET | Freq: Three times a day (TID) | ORAL | 0 refills | Status: DC | PRN

## 2017-12-17 MED ORDER — ACETAMINOPHEN 500 MG PO TABS: 500.0000 mg | ORAL_TABLET | Freq: Four times a day (QID) | ORAL | Status: AC | PRN

## 2017-12-17 MED ORDER — CEFDINIR 300 MG PO CAPS: 300.0000 mg | ORAL_CAPSULE | Freq: Two times a day (BID) | ORAL | 0 refills | Status: AC

## 2017-12-17 MED FILL — CEFDINIR 300 MG CAPSULE: 300 | 6 days supply | Qty: 12 | Fill #0

## 2017-12-17 MED FILL — METHOCARBAMOL 500 MG TABLET: 500 | 7 days supply | Qty: 21 | Fill #0

## 2017-12-17 NOTE — Progress Notes (Signed)
Pt seen by MD, orders written for d/c.  Went over discharge instructions with pt and answered all questions.  Removed IV, no complications.  New prescriptions given to pt.  Escorted for discharge via wheelchair with all belongings.  Will follow up outpatient with MD.

## 2017-12-17 NOTE — Discharge Instructions (Signed)
Please follow up with PCP and discuss the need for aldactone.

## 2017-12-17 NOTE — Discharge Summary (Signed)
[facility] Discharge Summary   Patient: Alison Alvarez EXN:[id number]   PCP: Unk Pinto, MD DOB: 1960-05-19   Date of admission: 12/14/2017   Date of discharge: 12/17/2017    Discharge Diagnoses:  Principal diagnosis Sepsis secondary to E. coli bacteremia due to UTI Principal Problem:   SIRS (systemic inflammatory response syndrome) (HCC) Active Problems:   Anxiety   Thyroid disease   Chronic back pain   Common bile duct dilatation   Pleural effusion, bilateral   Admitted From: home Disposition:  home  Recommendations for Outpatient Follow-up:  1. Please follow up with PCP in 1 week repeat CBC in 1 month. 2. Hold Aldactone for now until seen by PCP due to soft blood pressure.  Follow-up Information    Unk Pinto, MD. Schedule an appointment as soon as possible for a visit in 1 week(s).   Specialty:  Internal Medicine Contact information: [address] [place] [address] [phone]          Diet recommendation: regular diet  Activity: The patient is advised to gradually reintroduce usual activities.  Discharge Condition: good  Code Status: full code  History of present illness: As per the H and P dictated on admission, "Alison Alvarez is a 57 y.o. female with medical history significant of anxiety and hyperlipidemia with chronic back pain comes in with 5 days of myalgias with fevers with associated nausea and vomiting.  She denies any abdominal pain she denies any flank pain.  She denies any dysuria or any urinary symptoms.  She is only vomited once.  She has coughed for about a day.  She has been eating mainly soups.  She denies any rashes.  She denies any headaches.  Patient be referred for admission for fever she has multiple abnormalities on her work-up in the emergency department including pleural effusions along with a common bile duct dilation and imaging with possible pyelonephritis.  Patient is feeling much  better after IV fluids in the ED."  Hospital Course:  Summary of her active problems in the hospital is as following. 1.  Sepsis secondary to E. coli bacteremia and pyelonephritis. Treated with IV ceftriaxone. Pansensitive E. coli based on the culture.   Creatinine improving.   No further dizzy.   Sepsis physiology appears to have resolved. Sepsis present on admission. No hydronephrosis no renal stone on CT scan. No recent procedures as well. Switch to oral Ceftin ear and continue for 7 more days.  2.  Chronic anxiety. Continue Xanax as needed.  3.  Chronic back pain. Continue Robaxin and Tylenol.  4. Anemia dilutional.  Etiology not clear for now.  No active bleeding. H&H remained stable after initial drop. No iron deficiency no B12 deficiency no evidence of hemolysis.  5.  Elevated LFT. Likely secondary to sepsis. CT abdomen, ultrasound abdomen as well as MRCP negative for any acute abnormality.  No evidence of cholecystitis no choledocholithiasis.  All other chronic medical condition were stable during the hospitalization.  Patient was ambulatory without any assistance. On the day of the discharge the patient's vitals were stable , and no other acute medical condition were reported by patient. the patient was felt safe to be discharge at home with family.  Consultants: none Procedures: none  DISCHARGE MEDICATION: Allergies as of 12/17/2017      Reactions   Advicor [niacin-lovastatin Er] Swelling   Facial swelling   Niacin-lovastatin Er Swelling, Other (See Comments)   Extreme flushing      Medication  List    STOP taking these medications   spironolactone 100 MG tablet Commonly known as:  ALDACTONE     TAKE these medications   acetaminophen 500 MG tablet Commonly known as:  TYLENOL Take 1 tablet (500 mg total) by mouth every 6 (six) hours as needed for mild pain, fever or headache.   albuterol 108 (90 Base) MCG/ACT inhaler Commonly known as:  PROVENTIL  HFA;VENTOLIN HFA Inhale 2 puffs into the lungs every 6 (six) hours as needed for wheezing or shortness of breath.   ALPRAZolam 0.25 MG tablet Commonly known as:  XANAX Take 1 tablet (0.25 mg total) by mouth 3 (three) times daily.   azelastine 0.1 % nasal spray Commonly known as:  ASTELIN Place 2 sprays into both nostrils 2 (two) times daily. Use in each nostril as directed   cefdinir 300 MG capsule Commonly known as:  OMNICEF Take 1 capsule (300 mg total) by mouth every 12 (twelve) hours for 6 days.   levothyroxine 75 MCG tablet Commonly known as:  SYNTHROID, LEVOTHROID TAKE 1 TABLET BY MOUTH ONCE DAILY   methocarbamol 500 MG tablet Commonly known as:  ROBAXIN Take 1 tablet (500 mg total) by mouth every 8 (eight) hours as needed for muscle spasms.   omeprazole 20 MG capsule Commonly known as:  PRILOSEC TAKE 1 CAPSULE BY MOUTH ONCE DAILY      Allergies  Allergen Reactions  . Advicor [Niacin-Lovastatin Er] Swelling    Facial swelling  . Niacin-Lovastatin Er Swelling and Other (See Comments)    Extreme flushing   Discharge Instructions    Diet - low sodium heart healthy   Complete by:  As directed    Discharge instructions   Complete by:  As directed    It is important that you read following instructions as well as go over your medication list with RN to help you understand your care after this hospitalization.  Discharge Instructions: Please follow-up with PCP in one week  Please request your primary care physician to go over all Hospital Tests and Procedure/Radiological results at the follow up,  Please get all Hospital records sent to your PCP by signing hospital release before you go home.   Do not take more than prescribed Pain, Sleep and Anxiety Medications. You were cared for by a hospitalist during your hospital stay. If you have any questions about your discharge medications or the care you received while you were in the hospital after you are discharged, you  can call the unit you were admitted to and ask to speak with the hospitalist on call if the hospitalist that took care of you is not available.  Once you are discharged, your primary care physician will handle any further medical issues. Please note that NO REFILLS for any discharge medications will be authorized once you are discharged, as it is imperative that you return to your primary care physician (or establish a relationship with a primary care physician if you do not have one) for your aftercare needs so that they can reassess your need for medications and monitor your lab values. You Must read complete instructions/literature along with all the possible adverse reactions/side effects for all the Medicines you take and that have been prescribed to you. Take any new Medicines after you have completely understood and accept all the possible adverse reactions/side effects. Wear Seat belts while driving. If you have smoked or chewed Tobacco in the last 2 yrs please stop smoking and/or stop any Recreational drug use.  Increase activity slowly   Complete by:  As directed      Discharge Exam: Filed Weights   12/15/17 0322  Weight: 72.1 kg   Vitals:   12/17/17 1057 12/17/17 1059  BP: (!) 104/58 (!) 98/55  Pulse: 72 71  Resp:    Temp:    SpO2: 98% 100%   General: Appear in no distress, no Rash; Oral Mucosa moist. Cardiovascular: S1 and S2 Present, no Murmur, no JVD Respiratory: Bilateral Air entry present and Clear to Auscultation, no Crackles, no wheezes Abdomen: Bowel Sound present, Soft and no tenderness Extremities: no Pedal edema, no calf tenderness Neurology: Grossly no focal neuro deficit.  The results of significant diagnostics from this hospitalization (including imaging, microbiology, ancillary and laboratory) are listed below for reference.    Significant Diagnostic Studies: Dg Chest 2 View  Result Date: 12/14/2017 CLINICAL DATA:  Fever and chest tightness.  Cough for 4  days. EXAM: CHEST - 2 VIEW COMPARISON:  08/22/08. FINDINGS: The heart size appears normal. There are small bilateral pleural effusions noted with blunting of the posterior and lateral costophrenic angles. Etiology indeterminate. No airspace opacities identified. The visualized osseous structures are unremarkable. IMPRESSION: 1. Small bilateral pleural effusions are identified. 2. No airspace opacities. Electronically Signed   By: Kerby Moors M.D.   On: 12/14/2017 12:46   Ct Abdomen Pelvis W Contrast  Result Date: 12/14/2017 CLINICAL DATA:  Generalized abdominal pain and fever since Thursday. EXAM: CT ABDOMEN AND PELVIS WITH CONTRAST TECHNIQUE: Multidetector CT imaging of the abdomen and pelvis was performed using the standard protocol following bolus administration of intravenous contrast. CONTRAST:  185mL OMNIPAQUE IOHEXOL 300 MG/ML  SOLN COMPARISON:  Ultrasound abdomen 11/20/2010 FINDINGS: Lower chest: Normal heart size with trace pericardial effusion posteriorly. Trace bilateral pleural effusions with adjacent atelectasis. Minimal subsegmental linear platelike atelectasis at each lung base. Hepatobiliary: No focal liver abnormality is seen. No gallstones, gallbladder wall thickening, or biliary dilatation. Pancreas: Unremarkable. No pancreatic ductal dilatation or surrounding inflammatory changes. Spleen: Normal in size without focal abnormality. Adrenals/Urinary Tract: Normal bilateral adrenal glands. Mild inhomogeneous slightly striated enhancement of both kidneys, question pyelonephritis. Small exophytic simple cyst off the upper pole of the left kidney measuring 13 mm. Decompressed urinary bladder. Stomach/Bowel: Small hiatal hernia. Decompressed stomach. Normal duodenal sweep and ligament Treitz. Nonobstructed nondistended small bowel. Moderate stool retention within the colon without obstruction or inflammation. The appendix is normal. Vascular/Lymphatic: No significant vascular findings are present.  No enlarged abdominal or pelvic lymph nodes. Reproductive: Uterus and bilateral adnexa are unremarkable. Other: Small periumbilical fat containing hernia. No abdominopelvic ascites. Musculoskeletal: No acute or significant osseous findings. IMPRESSION: 1. Trace bilateral pleural effusions with adjacent atelectasis. 2. Mild inhomogeneous, slightly striated enhancement of both kidneys question pyelonephritis. 3. 13 mm simple cyst off the upper pole the left kidney. 4. Small periumbilical fat containing hernia. Electronically Signed   By: Ashley Royalty M.D.   On: 12/14/2017 23:16   Mr 3d Recon At Scanner  Result Date: 12/15/2017 CLINICAL DATA:  Following day history of progressive back pain, fever, nausea and vomiting. Elevated liver function studies with borderline biliary dilatation on ultrasound. EXAM: MRI ABDOMEN WITHOUT AND WITH CONTRAST (INCLUDING MRCP) TECHNIQUE: Multiplanar multisequence MR imaging of the abdomen was performed both before and after the administration of intravenous contrast. Heavily T2-weighted images of the biliary and pancreatic ducts were obtained, and three-dimensional MRCP images were rendered by post processing. CONTRAST:  7 cc Gadavist COMPARISON:  CT 12/14/2017.  Ultrasound earlier today.  FINDINGS: Lower chest: Trace bilateral pleural effusions and mild dependent atelectasis at both lung bases. Hepatobiliary: The liver is normal in signal without significant signal dropout on the gradient echo opposed phase images. There is no evidence of focal lesion or abnormal enhancement. The MRCP images are motion degraded. There is no biliary dilatation or evidence of choledocholithiasis. The gallbladder appears normal without wall thickening or stones. Pancreas: Unremarkable. No pancreatic ductal dilatation or surrounding inflammatory changes. Spleen: Normal in size without focal abnormality. Adrenals/Urinary Tract: Both adrenal glands appear normal. There is a mildly complex 15 mm cyst in the  upper pole of the left kidney, containing a small fluid-fluid level. This demonstrates no enhancement following contrast. There are no other focal renal lesions. As seen on CT, there is heterogeneous enhancement of both kidneys, worse on the right. There is associated perinephric soft tissue stranding, but no hydronephrosis or focal fluid collection. Stomach/Bowel: No evidence of bowel wall thickening, distention or surrounding inflammatory change. Vascular/Lymphatic: There are no enlarged abdominal lymph nodes. No significant vascular findings. Other: No ascites. Musculoskeletal: No acute or significant osseous findings. IMPRESSION: 1. Heterogeneous enhancement of both kidneys with perinephric soft tissue stranding bilaterally, most consistent with pyelonephritis. No hydronephrosis or abscess. Given unimpressive recent urinalysis results, this could represent noninfectious nephritis. However, there is no evidence of renal mass. 2. No biliary dilatation or signs of choledocholithiasis. 3. Small left renal cyst. 4. Bibasilar pulmonary atelectasis. Electronically Signed   By: Richardean Sale M.D.   On: 12/15/2017 12:01   Mr Abdomen Mrcp Moise Boring Contast  Result Date: 12/15/2017 CLINICAL DATA:  Following day history of progressive back pain, fever, nausea and vomiting. Elevated liver function studies with borderline biliary dilatation on ultrasound. EXAM: MRI ABDOMEN WITHOUT AND WITH CONTRAST (INCLUDING MRCP) TECHNIQUE: Multiplanar multisequence MR imaging of the abdomen was performed both before and after the administration of intravenous contrast. Heavily T2-weighted images of the biliary and pancreatic ducts were obtained, and three-dimensional MRCP images were rendered by post processing. CONTRAST:  7 cc Gadavist COMPARISON:  CT 12/14/2017.  Ultrasound earlier today. FINDINGS: Lower chest: Trace bilateral pleural effusions and mild dependent atelectasis at both lung bases. Hepatobiliary: The liver is normal in  signal without significant signal dropout on the gradient echo opposed phase images. There is no evidence of focal lesion or abnormal enhancement. The MRCP images are motion degraded. There is no biliary dilatation or evidence of choledocholithiasis. The gallbladder appears normal without wall thickening or stones. Pancreas: Unremarkable. No pancreatic ductal dilatation or surrounding inflammatory changes. Spleen: Normal in size without focal abnormality. Adrenals/Urinary Tract: Both adrenal glands appear normal. There is a mildly complex 15 mm cyst in the upper pole of the left kidney, containing a small fluid-fluid level. This demonstrates no enhancement following contrast. There are no other focal renal lesions. As seen on CT, there is heterogeneous enhancement of both kidneys, worse on the right. There is associated perinephric soft tissue stranding, but no hydronephrosis or focal fluid collection. Stomach/Bowel: No evidence of bowel wall thickening, distention or surrounding inflammatory change. Vascular/Lymphatic: There are no enlarged abdominal lymph nodes. No significant vascular findings. Other: No ascites. Musculoskeletal: No acute or significant osseous findings. IMPRESSION: 1. Heterogeneous enhancement of both kidneys with perinephric soft tissue stranding bilaterally, most consistent with pyelonephritis. No hydronephrosis or abscess. Given unimpressive recent urinalysis results, this could represent noninfectious nephritis. However, there is no evidence of renal mass. 2. No biliary dilatation or signs of choledocholithiasis. 3. Small left renal cyst. 4. Bibasilar pulmonary  atelectasis. Electronically Signed   By: Richardean Sale M.D.   On: 12/15/2017 12:01   US Abdomen Limited Ruq  Result Date: 12/15/2017 CLINICAL DATA:  LFT elevation EXAM: ULTRASOUND ABDOMEN LIMITED RIGHT UPPER QUADRANT COMPARISON:  None. FINDINGS: Gallbladder: No gallstones or wall thickening visualized. No sonographic Murphy  sign noted by sonographer. Common bile duct: Diameter: 7.3 mm Liver: No focal lesion identified. Within normal limits in parenchymal echogenicity. Portal vein is patent on color Doppler imaging with normal direction of blood flow towards the liver. IMPRESSION: Mild common bile duct dilatation of uncertain etiology. Recommend correlation with labs. An MRCP or ERCP could better evaluate the common bile duct if clinically warranted. Electronically Signed   By: Dorise Bullion III M.D   On: 12/15/2017 00:35    Microbiology: Recent Results (from the past 240 hour(s))  Urine Culture     Status: Abnormal   Collection Time: 12/14/17 11:46 AM  Result Value Ref Range Status   MICRO NUMBER: [id number]  Final   SPECIMEN QUALITY: Adequate  Final   Sample Source URINE  Final   STATUS: FINAL  Final   ISOLATE 1: Escherichia coli (A)  Final    Comment: 10,000-50,000 CFU/mL of Escherichia coli      Susceptibility   Escherichia coli - URINE CULTURE, REFLEX    AMOX/CLAVULANIC <=2 Sensitive     AMPICILLIN <=2 Sensitive     AMPICILLIN/SULBACTAM <=2 Sensitive     CEFAZOLIN* <=4 Not Reportable      * For infections other than uncomplicated UTIcaused by E. coli, K. pneumoniae or P. mirabilis:Cefazolin is resistant if MIC > or = 8 mcg/mL.(Distinguishing susceptible versus intermediatefor isolates with MIC < or = 4 mcg/mL requiresadditional testing.)For uncomplicated UTI caused by E. coli,K. pneumoniae or P. mirabilis: Cefazolin issusceptible if MIC <32 mcg/mL and predictssusceptible to the oral agents cefaclor, cefdinir,cefpodoxime, cefprozil, cefuroxime, cephalexinand loracarbef.    CEFEPIME <=1 Sensitive     CEFTRIAXONE <=1 Sensitive     CIPROFLOXACIN <=0.25 Sensitive     LEVOFLOXACIN <=0.12 Sensitive     ERTAPENEM <=0.5 Sensitive     GENTAMICIN <=1 Sensitive     IMIPENEM <=0.25 Sensitive     NITROFURANTOIN <=16 Sensitive     PIP/TAZO <=4 Sensitive     TOBRAMYCIN <=1 Sensitive     TRIMETH/SULFA* <=20 Sensitive       * For infections other than uncomplicated UTIcaused by E. coli, K. pneumoniae or P. mirabilis:Cefazolin is resistant if MIC > or = 8 mcg/mL.(Distinguishing susceptible versus intermediatefor isolates with MIC < or = 4 mcg/mL requiresadditional testing.)For uncomplicated UTI caused by E. coli,K. pneumoniae or P. mirabilis: Cefazolin issusceptible if MIC <32 mcg/mL and predictssusceptible to the oral agents cefaclor, cefdinir,cefpodoxime, cefprozil, cefuroxime, cephalexinand loracarbef.Legend:S = Susceptible  I = IntermediateR = Resistant  NS = Not susceptible* = Not tested  NR = Not reported**NN = See antimicrobic comments  Blood culture (routine x 2)     Status: Abnormal   Collection Time: 12/14/17  8:23 PM  Result Value Ref Range Status   Specimen Description BLOOD LEFT ARM  Final   Special Requests   Final    BOTTLES DRAWN AEROBIC AND ANAEROBIC Blood Culture results may not be optimal due to an inadequate volume of blood received in culture bottles   Culture  Setup Time   Final    GRAM NEGATIVE RODS ANAEROBIC BOTTLE ONLY CRITICAL RESULT CALLED TO, READ BACK BY AND VERIFIED WITH: Jene Every PharmD 11:15 12/15/17 ([email or web address]) Performed at [facility], [place] [address], [place], [place] [address]    Culture ESCHERICHIA COLI (A)  Final   Report Status 12/17/2017 FINAL  Final   Organism ID, Bacteria ESCHERICHIA COLI  Final      Susceptibility   Escherichia coli - MIC*    AMPICILLIN <=2 SENSITIVE Sensitive     CEFAZOLIN <=4 SENSITIVE Sensitive     CEFEPIME <=1 SENSITIVE Sensitive     CEFTAZIDIME <=1 SENSITIVE Sensitive     CEFTRIAXONE <=1 SENSITIVE Sensitive     CIPROFLOXACIN <=0.25 SENSITIVE Sensitive     GENTAMICIN <=1 SENSITIVE Sensitive     IMIPENEM <=0.25 SENSITIVE Sensitive     TRIMETH/SULFA <=20 SENSITIVE Sensitive     AMPICILLIN/SULBACTAM <=2 SENSITIVE Sensitive     PIP/TAZO <=4 SENSITIVE Sensitive     Extended ESBL NEGATIVE Sensitive     * ESCHERICHIA COLI  Blood Culture ID  Panel (Reflexed)     Status: Abnormal   Collection Time: 12/14/17  8:23 PM  Result Value Ref Range Status   Enterococcus species NOT DETECTED NOT DETECTED Final   Listeria monocytogenes NOT DETECTED NOT DETECTED Final   Staphylococcus species NOT DETECTED NOT DETECTED Final   Staphylococcus aureus (BCID) NOT DETECTED NOT DETECTED Final   Streptococcus species NOT DETECTED NOT DETECTED Final   Streptococcus agalactiae NOT DETECTED NOT DETECTED Final   Streptococcus pneumoniae NOT DETECTED NOT DETECTED Final   Streptococcus pyogenes NOT DETECTED NOT DETECTED Final   Acinetobacter baumannii NOT DETECTED NOT DETECTED Final   Enterobacteriaceae species DETECTED (A) NOT DETECTED Final    Comment: Enterobacteriaceae represent a large family of gram-negative bacteria, not a single organism. CRITICAL RESULT CALLED TO, READ BACK BY AND VERIFIED WITH: Jene Every PharmD 11:15 12/15/17 ([email or web address])    Enterobacter cloacae complex NOT DETECTED NOT DETECTED Final   Escherichia coli DETECTED (A) NOT DETECTED Final    Comment: CRITICAL RESULT CALLED TO, READ BACK BY AND VERIFIED WITH: Jene Every PharmD 11:15 12/15/17 ([email or web address])    Klebsiella oxytoca NOT DETECTED NOT DETECTED Final   Klebsiella pneumoniae NOT DETECTED NOT DETECTED Final   Proteus species NOT DETECTED NOT DETECTED Final   Serratia marcescens NOT DETECTED NOT DETECTED Final   Carbapenem resistance NOT DETECTED NOT DETECTED Final   Haemophilus influenzae NOT DETECTED NOT DETECTED Final   Neisseria meningitidis NOT DETECTED NOT DETECTED Final   Pseudomonas aeruginosa NOT DETECTED NOT DETECTED Final   Candida albicans NOT DETECTED NOT DETECTED Final   Candida glabrata NOT DETECTED NOT DETECTED Final   Candida krusei NOT DETECTED NOT DETECTED Final   Candida parapsilosis NOT DETECTED NOT DETECTED Final   Candida tropicalis NOT DETECTED NOT DETECTED Final    Comment: Performed at [facility], [place] [address], [place], [place] [address]  Blood  culture (routine x 2)     Status: Abnormal   Collection Time: 12/14/17  8:30 PM  Result Value Ref Range Status   Specimen Description BLOOD RIGHT ANTECUBITAL  Final   Special Requests   Final    BOTTLES DRAWN AEROBIC AND ANAEROBIC Blood Culture adequate volume   Culture  Setup Time   Final    GRAM NEGATIVE RODS ANAEROBIC BOTTLE ONLY CRITICAL VALUE NOTED.  VALUE IS CONSISTENT WITH PREVIOUSLY REPORTED AND CALLED VALUE.    Culture (A)  Final    ESCHERICHIA COLI SUSCEPTIBILITIES PERFORMED ON PREVIOUS CULTURE WITHIN THE LAST 5 DAYS. Performed at [facility], [place] [address] [place], [place] [address]    Report Status  12/17/2017 FINAL  Final  Urine culture     Status: Abnormal   Collection Time: 12/14/17 11:17 PM  Result Value Ref Range Status   Specimen Description URINE, RANDOM  Final   Special Requests   Final    NONE Performed at [facility], [place] [address] [place], [place] [address]    Culture 20,000 COLONIES/mL ESCHERICHIA COLI (A)  Final   Report Status 12/17/2017 FINAL  Final   Organism ID, Bacteria ESCHERICHIA COLI (A)  Final      Susceptibility   Escherichia coli - MIC*    AMPICILLIN <=2 SENSITIVE Sensitive     CEFAZOLIN <=4 SENSITIVE Sensitive     CEFTRIAXONE <=1 SENSITIVE Sensitive     CIPROFLOXACIN <=0.25 SENSITIVE Sensitive     GENTAMICIN <=1 SENSITIVE Sensitive     IMIPENEM <=0.25 SENSITIVE Sensitive     NITROFURANTOIN <=16 SENSITIVE Sensitive     TRIMETH/SULFA <=20 SENSITIVE Sensitive     AMPICILLIN/SULBACTAM <=2 SENSITIVE Sensitive     PIP/TAZO <=4 SENSITIVE Sensitive     Extended ESBL NEGATIVE Sensitive     * 20,000 COLONIES/mL ESCHERICHIA COLI  Respiratory Panel by PCR     Status: None   Collection Time: 12/15/17  2:41 AM  Result Value Ref Range Status   Adenovirus NOT DETECTED NOT DETECTED Final   Coronavirus 229E NOT DETECTED NOT DETECTED Final   Coronavirus HKU1 NOT DETECTED NOT DETECTED Final   Coronavirus NL63 NOT DETECTED NOT DETECTED  Final   Coronavirus OC43 NOT DETECTED NOT DETECTED Final   Metapneumovirus NOT DETECTED NOT DETECTED Final   Rhinovirus / Enterovirus NOT DETECTED NOT DETECTED Final   Influenza A NOT DETECTED NOT DETECTED Final   Influenza B NOT DETECTED NOT DETECTED Final   Parainfluenza Virus 1 NOT DETECTED NOT DETECTED Final   Parainfluenza Virus 2 NOT DETECTED NOT DETECTED Final   Parainfluenza Virus 3 NOT DETECTED NOT DETECTED Final   Parainfluenza Virus 4 NOT DETECTED NOT DETECTED Final   Respiratory Syncytial Virus NOT DETECTED NOT DETECTED Final   Bordetella pertussis NOT DETECTED NOT DETECTED Final   Chlamydophila pneumoniae NOT DETECTED NOT DETECTED Final   Mycoplasma pneumoniae NOT DETECTED NOT DETECTED Final    Comment: Performed at [facility], [place]. [address] [place], [place] [id number]     Labs: CBC: Recent Labs  Lab 12/14/17 1146 12/14/17 2024 12/15/17 0547 12/16/17 0220 12/17/17 0822  WBC 12.9* 13.9* 14.3* 11.7* 7.9  NEUTROABS 11,171* 11.6*  --  9.0* 5.8  HGB 11.2* 11.4* 9.8* 8.7* 8.9*  HCT 33.4* 34.2* 28.9* 26.7* 28.1*  MCV 94.1 92.9 91.2 92.7 94.6  PLT 309 332 279 313 503   Basic Metabolic Panel: Recent Labs  Lab 12/14/17 1146 12/14/17 2024 12/15/17 0547 12/16/17 0220 12/17/17 0822  NA 132* 132* 136 139 138  K 4.7 4.2 3.9 3.7 4.1  CL 96* 96* 102 109 105  CO2 26 23 22 23 25   GLUCOSE 105* 101* 104* 117* 112*  BUN 14 11 13 13 11   CREATININE 1.19* 1.22* 1.41* 1.12* 0.94  CALCIUM 9.3 9.5 8.6* 8.1* 8.9  MG  --   --   --  2.0  --    Liver Function Tests: Recent Labs  Lab 12/14/17 1146 12/14/17 2024 12/15/17 0547 12/16/17 0220 12/17/17 0822  AST 111* 86* 51* 37 33  ALT 325* 287* 201* 135* 112*  ALKPHOS  --  258* 217* 192* 166*  BILITOT 1.2 1.4* 1.0 0.8 0.6  PROT  6.2 7.3 6.3* 5.4* 6.0*  ALBUMIN  --  2.9* 2.4* 2.2* 2.3*   Recent Labs  Lab 12/14/17 1146  LIPASE 24  AMYLASE 17*   No results for input(s): AMMONIA in the last 168 hours. Cardiac  Enzymes: No results for input(s): CKTOTAL, CKMB, CKMBINDEX, TROPONINI in the last 168 hours. BNP (last 3 results) No results for input(s): BNP in the last 8760 hours. CBG: No results for input(s): GLUCAP in the last 168 hours. Time spent: 35 minutes  Signed:  Berle Mull  [facility] 12/17/2017 , 3:15 PM

## 2017-12-19 LAB — CULTURE, BLOOD (ROUTINE X 2)

## 2017-12-20 ENCOUNTER — Other Ambulatory Visit: Payer: Self-pay | Admitting: *Deleted

## 2017-12-20 ENCOUNTER — Encounter: Payer: Self-pay | Admitting: *Deleted

## 2017-12-20 NOTE — Progress Notes (Signed)
Hospital follow up  Assessment and Plan: Hospital visit follow up for: sepsis  Alison Alvarez was seen today for hospitalization follow-up.  Diagnoses and all orders for this visit:  Sepsis due to Escherichia coli, unspecified whether acute organ dysfunction present (HCC)/SIRS (systemic inflammatory response syndrome) (HCC) Continue omnicef, push hydration Symptoms much improved Will recheck CBC and UA in 1 month, follow up sooner if needed, return to ER for sudden or severe symptoms over the long holiday weekend -     CBC with Differential/Platelet -     COMPLETE METABOLIC PANEL WITH GFR -     CBC with Differential/Platelet; Future -     Urinalysis w microscopic + reflex cultur; Future  Pleural effusion, bilateral Mild, monitor, denies dyspnea or cough today. Should resolve spontaneously. Patient to call if experiencing respiratory symptoms.   Anemia due to other cause, not classified ? Dilutional, workup while admitted was neg without identified bleeding -     CBC with Differential/Platelet -     CBC with Differential/Platelet; Future  Elevated LFTs Presumed secondary to sepsis, improving on last check, hepatitis panel neg, MRCP/CT/US unremarkable -     COMPLETE METABOLIC PANEL WITH GFR  All medications were reviewed with patient and family and fully reconciled. All questions answered fully, and patient and family members were encouraged to call the office with any further questions or concerns. Discussed goal to avoid readmission related to this diagnosis.   There are no discontinued medications.  Over 40 minutes of exam, counseling, chart review, and complex, high/moderate level critical decision making was performed this visit.   Future Appointments  Date Time Provider Gardner  01/24/2018 11:15 AM [facility] NURSE [facility] None  02/02/2018  4:40 PM [facility] MM 3 [facility]  04/13/2018  2:30 PM Vicie Mutters, PA-C [facility] None  09/28/2018  3:00 PM Vicie Mutters, PA-C [facility] None     HPI 57 y.o.female presents for follow up for transition from recent hospitalization or SNIF stay. Admit date to the hospital was 12/14/17, patient was discharged from the hospital on 12/17/17 and our clinical staff contacted the office the day after discharge to set up a follow up appointment. The discharge summary, medications, and diagnostic test results were reviewed before meeting with the patient. The patient was admitted for: E. Coli sepsis, SIRS  The patient presented to ED after 5 days of fevers/myalgias/rigors, was seen by myself 11/19 with FUO, labs demonstrated leukocytosis and severely elevated LFTs and she presented to ED that evening due to worsening symptoms. She had non-focal symptosm; She denied any abdominal pain, flank pain, dysuria or any urinary symptoms. She is only vomited once, did have some mild nausea. She did endorse mild non-productive cough.  Patient was referred for admission for fever she has multiple abnormalities on her work-up in the emergency department including pleural effusions along with a common bile duct dilation and imaging with possible pyelonephritis though later imaging suggested otherwise. Blood cultures demonstrated pansensitive E. Coli, was treated initially with rocephin in ED then unasyn. MRCP for elevated LFTs and dilated bile duct was unremarkable, LFTs later spontaneously resolved. Lactic acid levels were mildly elevated, later resolved following IV abx.   Anemia was noted, presumeddilutional due to no active bleeding, H&H remained stable after initial drop. No iron deficiency no B12 deficiency no evidence of hemolysis.  Elevated LFTs were concluded likely secondary to sepsis. CT abdomen, ultrasound abdomen as well as MRCP negative for any acute abnormality. No evidence of cholecystitis no choledocholithiasis.  All other chronic medical condition were stable during the hospitalization.   She continues with  omnicef, feels she is still recovering, pushing fluid intake, she reports a fair appetite, but energy levels remain low and feeling particularly drained today. 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   Home health is not involved.   Images while in the hospital: Dg Chest 2 View  Result Date: 12/14/2017 CLINICAL DATA:  Fever and chest tightness.  Cough for 4 days. EXAM: CHEST - 2 VIEW COMPARISON:  08/22/08. FINDINGS: The heart size appears normal. There are small bilateral pleural effusions noted with blunting of the posterior and lateral costophrenic angles. Etiology indeterminate. No airspace opacities identified. The visualized osseous structures are unremarkable. IMPRESSION: 1. Small bilateral pleural effusions are identified. 2. No airspace opacities. Electronically Signed   By: Kerby Moors M.D.   On: 12/14/2017 12:46   Ct Abdomen Pelvis W Contrast  Result Date: 12/14/2017 CLINICAL DATA:  Generalized abdominal pain and fever since Thursday. EXAM: CT ABDOMEN AND PELVIS WITH CONTRAST TECHNIQUE: Multidetector CT imaging of the abdomen and pelvis was performed using the standard protocol following bolus administration of intravenous contrast. CONTRAST:  149mL OMNIPAQUE IOHEXOL 300 MG/ML  SOLN COMPARISON:  Ultrasound abdomen 11/20/2010 FINDINGS: Lower chest: Normal heart size with trace pericardial effusion posteriorly. Trace bilateral pleural effusions with adjacent atelectasis. Minimal subsegmental linear platelike atelectasis at each lung base. Hepatobiliary: No focal liver abnormality is seen. No gallstones, gallbladder wall thickening, or biliary dilatation. Pancreas: Unremarkable. No pancreatic ductal dilatation or surrounding inflammatory changes. Spleen: Normal in size without focal abnormality. Adrenals/Urinary Tract: Normal bilateral adrenal glands. Mild inhomogeneous slightly striated  enhancement of both kidneys, question pyelonephritis. Small exophytic simple cyst off the upper pole of the left kidney measuring 13 mm. Decompressed urinary bladder. Stomach/Bowel: Small hiatal hernia. Decompressed stomach. Normal duodenal sweep and ligament Treitz. Nonobstructed nondistended small bowel. Moderate stool retention within the colon without obstruction or inflammation. The appendix is normal. Vascular/Lymphatic: No significant vascular findings are present. No enlarged abdominal or pelvic lymph nodes. Reproductive: Uterus and bilateral adnexa are unremarkable. Other: Small periumbilical fat containing hernia. No abdominopelvic ascites. Musculoskeletal: No acute or significant osseous findings. IMPRESSION: 1. Trace bilateral pleural effusions with adjacent atelectasis. 2. Mild inhomogeneous, slightly striated enhancement of both kidneys question pyelonephritis. 3. 13 mm simple cyst off the upper pole the left kidney. 4. Small periumbilical fat containing hernia. Electronically Signed   By: Senya Hinzman Royalty M.D.   On: 12/14/2017 23:16   Mr 3d Recon At Scanner  Result Date: 12/15/2017 CLINICAL DATA:  Following day history of progressive back pain, fever, nausea and vomiting. Elevated liver function studies with borderline biliary dilatation on ultrasound. EXAM: MRI ABDOMEN WITHOUT AND WITH CONTRAST (INCLUDING MRCP) TECHNIQUE: Multiplanar multisequence MR imaging of the abdomen was performed both before and after the administration of intravenous contrast. Heavily T2-weighted images of the biliary and pancreatic ducts were obtained, and three-dimensional MRCP images were rendered by post processing. CONTRAST:  7 cc Gadavist COMPARISON:  CT 12/14/2017.  Ultrasound earlier today. FINDINGS: Lower chest: Trace bilateral pleural effusions and mild dependent atelectasis at both lung bases. Hepatobiliary: The liver is normal in signal without significant signal dropout on the gradient echo opposed phase images.  There is no evidence of focal lesion or abnormal enhancement. The MRCP images are motion degraded. There is no biliary dilatation or evidence of choledocholithiasis. The  gallbladder appears normal without wall thickening or stones. Pancreas: Unremarkable. No pancreatic ductal dilatation or surrounding inflammatory changes. Spleen: Normal in size without focal abnormality. Adrenals/Urinary Tract: Both adrenal glands appear normal. There is a mildly complex 15 mm cyst in the upper pole of the left kidney, containing a small fluid-fluid level. This demonstrates no enhancement following contrast. There are no other focal renal lesions. As seen on CT, there is heterogeneous enhancement of both kidneys, worse on the right. There is associated perinephric soft tissue stranding, but no hydronephrosis or focal fluid collection. Stomach/Bowel: No evidence of bowel wall thickening, distention or surrounding inflammatory change. Vascular/Lymphatic: There are no enlarged abdominal lymph nodes. No significant vascular findings. Other: No ascites. Musculoskeletal: No acute or significant osseous findings. IMPRESSION: 1. Heterogeneous enhancement of both kidneys with perinephric soft tissue stranding bilaterally, most consistent with pyelonephritis. No hydronephrosis or abscess. Given unimpressive recent urinalysis results, this could represent noninfectious nephritis. However, there is no evidence of renal mass. 2. No biliary dilatation or signs of choledocholithiasis. 3. Small left renal cyst. 4. Bibasilar pulmonary atelectasis. Electronically Signed   By: Richardean Sale M.D.   On: 12/15/2017 12:01   Mr Abdomen Mrcp Moise Boring Contast  Result Date: 12/15/2017 CLINICAL DATA:  Following day history of progressive back pain, fever, nausea and vomiting. Elevated liver function studies with borderline biliary dilatation on ultrasound. EXAM: MRI ABDOMEN WITHOUT AND WITH CONTRAST (INCLUDING MRCP) TECHNIQUE: Multiplanar multisequence MR  imaging of the abdomen was performed both before and after the administration of intravenous contrast. Heavily T2-weighted images of the biliary and pancreatic ducts were obtained, and three-dimensional MRCP images were rendered by post processing. CONTRAST:  7 cc Gadavist COMPARISON:  CT 12/14/2017.  Ultrasound earlier today. FINDINGS: Lower chest: Trace bilateral pleural effusions and mild dependent atelectasis at both lung bases. Hepatobiliary: The liver is normal in signal without significant signal dropout on the gradient echo opposed phase images. There is no evidence of focal lesion or abnormal enhancement. The MRCP images are motion degraded. There is no biliary dilatation or evidence of choledocholithiasis. The gallbladder appears normal without wall thickening or stones. Pancreas: Unremarkable. No pancreatic ductal dilatation or surrounding inflammatory changes. Spleen: Normal in size without focal abnormality. Adrenals/Urinary Tract: Both adrenal glands appear normal. There is a mildly complex 15 mm cyst in the upper pole of the left kidney, containing a small fluid-fluid level. This demonstrates no enhancement following contrast. There are no other focal renal lesions. As seen on CT, there is heterogeneous enhancement of both kidneys, worse on the right. There is associated perinephric soft tissue stranding, but no hydronephrosis or focal fluid collection. Stomach/Bowel: No evidence of bowel wall thickening, distention or surrounding inflammatory change. Vascular/Lymphatic: There are no enlarged abdominal lymph nodes. No significant vascular findings. Other: No ascites. Musculoskeletal: No acute or significant osseous findings. IMPRESSION: 1. Heterogeneous enhancement of both kidneys with perinephric soft tissue stranding bilaterally, most consistent with pyelonephritis. No hydronephrosis or abscess. Given unimpressive recent urinalysis results, this could represent noninfectious nephritis. However,  there is no evidence of renal mass. 2. No biliary dilatation or signs of choledocholithiasis. 3. Small left renal cyst. 4. Bibasilar pulmonary atelectasis. Electronically Signed   By: Richardean Sale M.D.   On: 12/15/2017 12:01   US Abdomen Limited Ruq  Result Date: 12/15/2017 CLINICAL DATA:  LFT elevation EXAM: ULTRASOUND ABDOMEN LIMITED RIGHT UPPER QUADRANT COMPARISON:  None. FINDINGS: Gallbladder: No gallstones or wall thickening visualized. No sonographic Murphy sign noted by sonographer. Common bile duct: Diameter:  7.3 mm Liver: No focal lesion identified. Within normal limits in parenchymal echogenicity. Portal vein is patent on color Doppler imaging with normal direction of blood flow towards the liver. IMPRESSION: Mild common bile duct dilatation of uncertain etiology. Recommend correlation with labs. An MRCP or ERCP could better evaluate the common bile duct if clinically warranted. Electronically Signed   By: Dorise Bullion III M.D   On: 12/15/2017 00:35     Current Outpatient Medications (Endocrine & Metabolic):  .  levothyroxine (SYNTHROID, LEVOTHROID) 75 MCG tablet, TAKE 1 TABLET BY MOUTH ONCE DAILY  Current Outpatient Medications (Cardiovascular):  .  ezetimibe-simvastatin (VYTORIN) 10-40 MG tablet, Take 1 tablet by mouth at bedtime. .  fenofibrate micronized (LOFIBRA) 134 MG capsule, Take 134 mg by mouth 3 (three) times a week.  Current Outpatient Medications (Respiratory):  .  albuterol (PROVENTIL HFA;VENTOLIN HFA) 108 (90 Base) MCG/ACT inhaler, Inhale 2 puffs into the lungs every 6 (six) hours as needed for wheezing or shortness of breath. .  cetirizine (ZYRTEC ALLERGY) 10 MG tablet, Take 10 mg by mouth daily. Marland Kitchen  azelastine (ASTELIN) 0.1 % nasal spray, Place 2 sprays into both nostrils 2 (two) times daily. Use in each nostril as directed (Patient not taking: Reported on 12/14/2017)  Current Outpatient Medications (Analgesics):  .  acetaminophen (TYLENOL) 500 MG tablet, Take 1  tablet (500 mg total) by mouth every 6 (six) hours as needed for mild pain, fever or headache. Marland Kitchen  HYDROcodone-acetaminophen (NORCO/VICODIN) 5-325 MG tablet, Take 1 tablet by mouth every 6 (six) hours as needed for moderate pain.   Current Outpatient Medications (Other):  Marland Kitchen  ALPRAZolam (XANAX) 0.25 MG tablet, Take 1 tablet (0.25 mg total) by mouth 3 (three) times daily. [name] Products (PERIDRIN-C) 200-50-150 MG TABS, Take 3 tablets by mouth daily. .  cefdinir (OMNICEF) 300 MG capsule, Take 1 capsule (300 mg total) by mouth every 12 (twelve) hours for 6 days. .  Coenzyme Q10 (COQ10) 200 MG CAPS, Take 1 capsule by mouth daily. .  methocarbamol (ROBAXIN) 500 MG tablet, Take 1 tablet (500 mg total) by mouth every 8 (eight) hours as needed for muscle spasms. Marland Kitchen  omeprazole (PRILOSEC) 20 MG capsule, TAKE 1 CAPSULE BY MOUTH ONCE DAILY .  Vitamin D, Ergocalciferol, (DRISDOL) 1.25 MG (50000 UT) CAPS capsule, Take 50,000 Units by mouth 2 (two) times a week. Takes every Monday and Friday  Past Medical History:  Diagnosis Date  . Acne   . Anxiety   . Arthritis   . Cervical polyp   . Chronic back pain   . Depression   . Endometriosis   . GERD (gastroesophageal reflux disease)   . Hyperlipidemia   . Hypothyroidism, adult    hypothyroidism  . PCO (polycystic ovaries)   . Plantar fasciitis   . Post-menopausal   . Pre-diabetes      Allergies  Allergen Reactions  . Advicor [Niacin-Lovastatin Er] Swelling    Facial swelling  . Niacin-Lovastatin Er Swelling and Other (See Comments)    Extreme flushing    ROS: all negative except above.   Physical Exam: Filed Weights   12/21/17 1139  Weight: 154 lb (69.9 kg)   BP 118/74   Pulse 71   Temp (!) 96.8 F (36 C)   Ht 5' 1.5" (1.562 m)   Wt 154 lb (69.9 kg)   SpO2 99%   BMI 28.63 kg/m  General Appearance: Well nourished, appears pale, in no apparent distress. Eyes: PERRLA, EOMs, conjunctiva no swelling  or erythema Sinuses: No  Frontal/maxillary tenderness ENT/Mouth: Ext aud canals clear, TMs without erythema, bulging. No erythema, swelling, or exudate on post pharynx.  Tonsils not swollen or erythematous. Hearing normal.  Neck: Supple, thyroid normal.  Respiratory: Respiratory effort normal, BS  without rales, + mild scattered rhonchi through mid lobs, mildly diminished in bilateral lower lobes, without wheezing or stridor.  Cardio: RRR with no MRGs. Brisk peripheral pulses without edema.  Abdomen: Soft, + BS.  Non tender, no guarding, rebound, hernias, masses. Lymphatics: Non tender without lymphadenopathy.  Musculoskeletal: Symmetrical strength, normal gait.  Skin: Warm, dry without rashes, lesions, ecchymosis.  Neuro: Cranial nerves intact. Normal muscle tone, no cerebellar symptoms.  Psych: Awake and oriented X 3, normal affect, Insight and Judgment appropriate.     Izora Ribas, NP 12:49 PM [facility]

## 2017-12-20 NOTE — Patient Outreach (Addendum)
[facility]) Care Management  12/20/2017  Alison Alvarez 1960-05-18 [id number]   Subjective: Telephone call to patient's home  / mobile number, no answer, left HIPAA compliant voicemail message, and requested call back. Telephone call to patient's home / mobile number, spoke with patient, and HIPAA verified.  Discussed [facility] Care Management [facility] Transition of care follow up, patient voiced understanding, and is in agreement to follow up.   Patient states she is feeling much better and has a follow up appointment with primary MD on 12/21/17.  Patient states she is able to manage self care and has assistance as needed. Patient voices understanding of medical diagnosis and treatment plan. States she is accessing the following [facility] benefits: outpatient pharmacy, hospital indemnity (verbally given contact number for Teena Dunk [phone], will file claim if appropriate, verbally given contact number for [employer] [phone] to request itemized bill), and verbally given contact number for [employer] [phone], will call [employer] today to start family medical leave act (FMLA) process.   Patient aware she may need to obtain copy of all FMLA paperwork for her records and to follow up to verify paperwork completed in a timely fashion.  States she is also in the process of following  up with [facility] Benefit Specialist Verline Lema) regarding her benefit questions.  Discussed prediabetes disease management resources for [employer], patient voices understanding, and states she has accessed resources through the [employer] web site as needed.  Patient states she does not have any education material, transition of care, care coordination, disease management, disease monitoring, transportation, community resource, or pharmacy needs at this time.  States she is very appreciative of the follow up and is in agreement to receive [place] Management information.      Objective: Per KPN (Knowledge Performance Now, point of care tool) and chart review, patient hospitalized 12/14/17 - 12/17/17 for Sepsis secondary to E. coli bacteremia due to UTI.   Patient also has a history of hyperlipidemia, hypothyroidism, and prediabetes.       Assessment: Received [facility] Transition of care referral on 12/16/17.   Transition of care follow up completed, no care management needs, and will proceed with case closure.       Plan: RNCM will send patient successful outreach letter, [facility] pamphlet, and magnet. RNCM will complete case closure due to follow up completed / no care management needs.       Zenovia Justman H. Annia Friendly, BSN, [place] Management [facility] Telephonic CM Phone: [phone] Fax: [phone]

## 2017-12-21 ENCOUNTER — Ambulatory Visit: Payer: 59 | Admitting: Adult Health

## 2017-12-21 ENCOUNTER — Encounter: Payer: Self-pay | Admitting: Adult Health

## 2017-12-21 VITALS — BP 118/74 | HR 71 | Temp 96.8°F | Ht 61.5 in | Wt 154.0 lb

## 2017-12-21 DIAGNOSIS — D6489 Other specified anemias: Secondary | ICD-10-CM

## 2017-12-21 DIAGNOSIS — A4151 Sepsis due to Escherichia coli [E. coli]: Secondary | ICD-10-CM

## 2017-12-21 DIAGNOSIS — R945 Abnormal results of liver function studies: Secondary | ICD-10-CM

## 2017-12-21 DIAGNOSIS — R6510 Systemic inflammatory response syndrome (SIRS) of non-infectious origin without acute organ dysfunction: Secondary | ICD-10-CM | POA: Diagnosis not present

## 2017-12-21 DIAGNOSIS — R7989 Other specified abnormal findings of blood chemistry: Secondary | ICD-10-CM

## 2017-12-21 DIAGNOSIS — J90 Pleural effusion, not elsewhere classified: Secondary | ICD-10-CM

## 2017-12-21 LAB — COMPLETE METABOLIC PANEL WITH GFR
AG Ratio: 1.6 (calc) (ref 1.0–2.5)
ALBUMIN MSPROF: 4.2 g/dL (ref 3.6–5.1)
ALKALINE PHOSPHATASE (APISO): 162 U/L — AB (ref 33–130)
ALT: 55 U/L — ABNORMAL HIGH (ref 6–29)
AST: 21 U/L (ref 10–35)
BUN: 17 mg/dL (ref 7–25)
CO2: 28 mmol/L (ref 20–32)
CREATININE: 0.75 mg/dL (ref 0.50–1.05)
Calcium: 9.8 mg/dL (ref 8.6–10.4)
Chloride: 99 mmol/L (ref 98–110)
GFR, EST NON AFRICAN AMERICAN: 88 mL/min/{1.73_m2} (ref >=60)
GFR, Est African American: 103 mL/min/{1.73_m2} (ref >=60)
GLOBULIN: 2.7 g/dL (ref 1.9–3.7)
Glucose, Bld: 90 mg/dL (ref 65–99)
Potassium: 4.2 mmol/L (ref 3.5–5.3)
Sodium: 137 mmol/L (ref 135–146)
Total Bilirubin: 0.6 mg/dL (ref 0.2–1.2)
Total Protein: 6.9 g/dL (ref 6.1–8.1)

## 2017-12-21 LAB — CBC WITH DIFFERENTIAL/PLATELET
Basophils Absolute: 29 cells/uL (ref 0–200)
Basophils Relative: 0.5 %
EOS ABS: 68 {cells}/uL (ref 15–500)
Eosinophils Relative: 1.2 %
HEMATOCRIT: 35.2 % (ref 35.0–45.0)
HEMOGLOBIN: 11.4 g/dL — AB (ref 11.7–15.5)
LYMPHS ABS: 1231 {cells}/uL (ref 850–3900)
MCH: 31 pg (ref 27.0–33.0)
MCHC: 32.4 g/dL (ref 32.0–36.0)
MCV: 95.7 fL (ref 80.0–100.0)
MPV: 8.5 fL (ref 7.5–12.5)
Monocytes Relative: 6.5 %
NEUTROS ABS: 4001 {cells}/uL (ref 1500–7800)
Neutrophils Relative %: 70.2 %
Platelets: 702 10*3/uL — ABNORMAL HIGH (ref 140–400)
RBC: 3.68 10*6/uL — ABNORMAL LOW (ref 3.80–5.10)
RDW: 12.7 % (ref 11.0–15.0)
Total Lymphocyte: 21.6 %
WBC: 5.7 10*3/uL (ref 3.8–10.8)
WBCMIX: 371 {cells}/uL (ref 200–950)

## 2017-12-21 NOTE — Patient Instructions (Addendum)
"align" OTC probiotic for a few weeks to help - ok to take imodium if needed  Saline irrigations, flonase/mometasone nasal sprays  Nurse visit in 1 month to follow up on labs- UA, CBC  Cefdinir capsules What is this medicine? CEFDINIR (SEF di ner) is a cephalosporin antibiotic. It is used to treat certain kinds of bacterial infections. It will not work for colds, flu, or other viral infections. This medicine may be used for other purposes; ask your health care provider or pharmacist if you have questions. COMMON BRAND NAME(S): Omnicef What should I tell my health care provider before I take this medicine? They need to know if you have any of these conditions: -bleeding problems -kidney disease -stomach or intestine problems (especially colitis) -an unusual or allergic reaction to cefdinir, other cephalosporin antibiotics, penicillin, penicillamine, other foods, dyes or preservatives -pregnant or trying to get pregnant -breast-feeding How should I use this medicine? Take this medicine by mouth. Swallow it with a drink of water. Follow the directions on the prescription label. You can take it with or without food. If it upsets your stomach it may help to take it with food. Take your doses at regular intervals. Do not take it more often than directed. Finish all the medicine you are prescribed even if you think your infection is better. Talk to your pediatrician regarding the use of this medicine in children. Special care may be needed. Overdosage: If you think you have taken too much of this medicine contact a poison control center or emergency room at once. NOTE: This medicine is only for you. Do not share this medicine with others. What if I miss a dose? If you miss a dose, take it as soon as you can. If it is almost time for your next dose, take only that dose. Do not take double or extra doses. What may interact with this medicine? -antacids that contain aluminum or magnesium -iron  supplements -other antibiotics -probenecid This list may not describe all possible interactions. Give your health care provider a list of all the medicines, herbs, non-prescription drugs, or dietary supplements you use. Also tell them if you smoke, drink alcohol, or use illegal drugs. Some items may interact with your medicine. What should I watch for while using this medicine? Tell your doctor or health care professional if your symptoms do not get better in a few days. If you are diabetic you may get a false-positive result for sugar in your urine. Check with your doctor or health care professional before you change your diet or the dose of your diabetes medicine. What side effects may I notice from receiving this medicine? Side effects that you should report to your doctor or health care professional as soon as possible: -allergic reactions like skin rash, itching or hives, swelling of the face, lips, or tongue -bloody or watery diarrhea -breathing problems -fever -redness, blistering, peeling or loosening of the skin, including inside the mouth -seizures -trouble passing urine or change in the amount of urine -unusual bleeding or bruising -unusually weak or tired Side effects that usually do not require medical attention (report to your doctor or health care professional if they continue or are bothersome): -constipation -diarrhea -dizziness -dry mouth -headache -loss of appetite -nausea, vomiting -stomach pain -stool discoloration -tiredness -vaginal discharge, itching, or odor in women This list may not describe all possible side effects. Call your doctor for medical advice about side effects. You may report side effects to FDA at 1-800-FDA-1088. Where should I  keep my medicine? Keep out of the reach of children. Store at room temperature between 15 and 30 degrees C (59 and 86 degrees F). Throw the medicine away after the expiration date. NOTE: This sheet is a summary. It may  not cover all possible information. If you have questions about this medicine, talk to your doctor, pharmacist, or health care provider.  2018 Elsevier/Gold Standard (2015-05-20 15:52:44)    Sepsis, Adult Sepsis is a serious bodily reaction to an infection. The infection that causes sepsis may be from a bacteria, a virus, a fungus, or a parasite. Sepsis can result from an infection in any part of the body. Infections that commonly lead to sepsis include skin, lung, and urinary tract infections. Sepsis is a medical emergency that requires immediate treatment at the hospital. In severe cases, it can lead to septic shock. Shock can weaken the heart and cause blood pressure to drop. This can make the central nervous system and the body's organs to stop working. What are the causes? This condition is caused by a severe reaction to a bacterial, viral, fungal, or parasitic infection. The germs that most commonly lead to sepsis include:  Escherichia coli (E. coli).  Staphylococcus aureus (staph).  The most common infections that lead to sepsis include infections of:  The skin.  The lung (pneumonia).  The gut.  The kidneys (urinary tract infection).  What increases the risk? You are more likely to develop this condition if:  You have a weakened disease-fighting (immune) system.  You are 60 or older.  You are female.  You had surgery, or you have been hospitalized.  You have a catheter, breathing tube, or drainage tubes inserted into your body.  You are not getting enough nutrients from food (are malnourished).  You have other long-term (chronic) diseases, including: ? Cancer. ? AIDS. ? Liver disease. ? Lung disease. ? Diabetes.  You have severe burns or injuries.  You inject drugs.  You have heart valve problems.  What are the signs or symptoms? Symptoms of this condition may include:  Fever.  Chills or feeling very cold.  Fast heart rate (tachycardia).  Rapid  breathing (hyperventilation).  Shortness of breath.  Confusion or light-headedness.  Changes in skin color. Your skin may look blotchy, pale, or blue.  Cool, clammy skin or sweaty skin.  Skin rash.  Nausea and vomiting.  Urinating much less than usual.  How is this diagnosed? This condition is diagnosed based on:  Your symptoms.  Your medical history.  A physical exam.  Other tests may also be done to find out the cause of the infection and how severe the sepsis is. These tests may include:  Blood tests.  Urine tests.  Swabs from other areas of the body that may have an infection. These samples may be tested (cultured) to find out what type of bacteria is causing the infection.  Chest X-ray to check for pneumonia. Other imaging tests, such as a CT scan, may also be done.  Lumbar puncture. This is a procedure to remove a small amount of the fluid that surrounds the brain and spinal cord. The fluid is then examined for infection.  How is this treated? This condition is treated in a hospital with antibiotic medicines. You may also receive:  Fluids through an IV tube.  Oxygen and breathing assistance.  Kidney dialysis. This process cleans the blood if the kidneys have failed.  Surgery to remove infected tissue.  Medicines to increase your blood pressure.  Nutrients to correct imbalances in basic body function (metabolism). This may involve receiving important salts and minerals (electrolytes) through an IV and having your blood sugar level adjusted.  Steroid medicines to control your body's reaction to the infection.  Follow these instructions at home: Medicines  Take over-the-counter and prescription medicines only as told by your health care provider.  If you were prescribed an antibiotic or anti-fungal medicine, take it as told by your health care provider. Do not stop taking the antibiotic or anti-fungal medicine even if you start to feel  better. Activity  Rest and gradually return to your normal activities. Ask your health care provider what activities are safe for you.  Try to set small, achievable goals each week, such as dressing yourself, bathing, or walking up stairs. It may take a while to rebuild your strength.  Try to exercise regularly, if you feel healthy enough to do so. Ask your health care provider what exercises are safe for you. General instructions  Drink enough fluid to keep your urine clear or pale yellow.  Eat a healthy, balanced diet. This includes plenty of fruits and vegetables, whole grains, and lowfat (lean) proteins. Ask your health care provider if you should avoid certain foods.  Keep all follow-up visits as told by your health care provider. This is important. Contact a health care provider if:  You do not feel like you are getting better or regaining strength.  You are having trouble coping with your recovery.  You frequently feel tired.  You feel worse or do not seem to get better after surgery.  You think you may have an infection after surgery. Get help right away if:  You have any symptoms of sepsis.  You have difficulty breathing.  You have a rapid or skipping heartbeat.  You become confused.  You have a high fever.  Your skin becomes blotchy, pale, or blue. These symptoms may represent a serious problem that is an emergency. Do not wait to see if the symptoms will go away. Get medical help right away. Call your local emergency services (911 in the [place] Summary  Sepsis is a medical emergency that requires immediate treatment at the hospital.  This condition is caused by a severe reaction to a bacterial, viral, fungal, or parasitic infection.  This condition is treated in a hospital with antibiotics. Treatment may also include IV fluids, breathing assistance, and kidney dialysis.  If you were prescribed an antibiotic or anti-fungal medicine, take it as told by your  health care provider. Do not stop taking the antibiotic or anti-fungal medicine even if you start to feel better. This information is not intended to replace advice given to you by your health care provider. Make sure you discuss any questions you have with your health care provider. Document Released: 10/11/2002 Document Revised: 12/17/2015 Document Reviewed: 12/17/2015 Elsevier Interactive Patient Education  [employer].

## 2017-12-22 ENCOUNTER — Other Ambulatory Visit: Payer: Self-pay | Admitting: Adult Health

## 2017-12-22 DIAGNOSIS — R748 Abnormal levels of other serum enzymes: Secondary | ICD-10-CM

## 2017-12-23 MED FILL — MOMETASONE FUROATE 50 MCG S: 50 | 60 days supply | Qty: 17 | Fill #1

## 2017-12-28 ENCOUNTER — Inpatient Hospital Stay: Payer: 59 | Attending: Gynecologic Oncology

## 2017-12-28 ENCOUNTER — Other Ambulatory Visit: Payer: Self-pay | Admitting: Internal Medicine

## 2017-12-28 ENCOUNTER — Other Ambulatory Visit: Payer: Self-pay | Admitting: Gynecologic Oncology

## 2017-12-28 ENCOUNTER — Ambulatory Visit (HOSPITAL_BASED_OUTPATIENT_CLINIC_OR_DEPARTMENT_OTHER): Payer: 59 | Admitting: Gynecologic Oncology

## 2017-12-28 VITALS — BP 108/48 | HR 68 | Temp 98.8°F | Resp 22 | Wt 154.0 lb

## 2017-12-28 DIAGNOSIS — N12 Tubulo-interstitial nephritis, not specified as acute or chronic: Principal | ICD-10-CM

## 2017-12-28 DIAGNOSIS — E861 Hypovolemia: Secondary | ICD-10-CM | POA: Diagnosis not present

## 2017-12-28 DIAGNOSIS — I9589 Other hypotension: Secondary | ICD-10-CM

## 2017-12-28 DIAGNOSIS — B9620 Unspecified Escherichia coli [E. coli] as the cause of diseases classified elsewhere: Secondary | ICD-10-CM

## 2017-12-28 DIAGNOSIS — I959 Hypotension, unspecified: Secondary | ICD-10-CM

## 2017-12-28 LAB — CBC WITH DIFFERENTIAL (CANCER CENTER ONLY)
Abs Immature Granulocytes: 0 10*3/uL (ref 0.00–0.07)
Basophils Absolute: 0.1 10*3/uL (ref 0.0–0.1)
Basophils Relative: 2 %
EOS PCT: 4 %
Eosinophils Absolute: 0.1 10*3/uL (ref 0.0–0.5)
HCT: 33.6 % — ABNORMAL LOW (ref 36.0–46.0)
HEMOGLOBIN: 10.7 g/dL — AB (ref 12.0–15.0)
Immature Granulocytes: 0 %
LYMPHS PCT: 35 %
Lymphs Abs: 1.2 10*3/uL (ref 0.7–4.0)
MCH: 31.3 pg (ref 26.0–34.0)
MCHC: 31.8 g/dL (ref 30.0–36.0)
MCV: 98.2 fL (ref 80.0–100.0)
Monocytes Absolute: 0.3 10*3/uL (ref 0.1–1.0)
Monocytes Relative: 9 %
Neutro Abs: 1.8 10*3/uL (ref 1.7–7.7)
Neutrophils Relative %: 50 %
Platelet Count: 527 10*3/uL — ABNORMAL HIGH (ref 150–400)
RBC: 3.42 MIL/uL — ABNORMAL LOW (ref 3.87–5.11)
RDW: 13.5 % (ref 11.5–15.5)
WBC Count: 3.4 10*3/uL — ABNORMAL LOW (ref 4.0–10.5)
nRBC: 0 % (ref 0.0–0.2)

## 2017-12-28 LAB — URINALYSIS, COMPLETE (UACMP) WITH MICROSCOPIC
Bacteria, UA: NONE SEEN
Bilirubin Urine: NEGATIVE
Glucose, UA: NEGATIVE mg/dL
Hgb urine dipstick: NEGATIVE
Ketones, ur: NEGATIVE mg/dL
LEUKOCYTES UA: NEGATIVE
Nitrite: NEGATIVE
PROTEIN: NEGATIVE mg/dL
Specific Gravity, Urine: 1.002 — ABNORMAL LOW (ref 1.005–1.030)
pH: 6 (ref 5.0–8.0)

## 2017-12-28 LAB — COMPREHENSIVE METABOLIC PANEL
ALT: 26 U/L (ref 0–44)
AST: 20 U/L (ref 15–41)
Albumin: 3.6 g/dL (ref 3.5–5.0)
Alkaline Phosphatase: 117 U/L (ref 38–126)
Anion gap: 9 (ref 5–15)
BUN: 9 mg/dL (ref 6–20)
CO2: 27 mmol/L (ref 22–32)
CREATININE: 0.91 mg/dL (ref 0.44–1.00)
Calcium: 9.6 mg/dL (ref 8.9–10.3)
Chloride: 106 mmol/L (ref 98–111)
GFR calc Af Amer: >60 mL/min (ref >60)
GFR calc non Af Amer: >60 mL/min (ref >60)
Glucose, Bld: 121 mg/dL — ABNORMAL HIGH (ref 70–99)
Potassium: 3.8 mmol/L (ref 3.5–5.1)
Sodium: 142 mmol/L (ref 135–145)
Total Bilirubin: 0.3 mg/dL (ref 0.3–1.2)
Total Protein: 7.1 g/dL (ref 6.5–8.1)

## 2017-12-28 NOTE — Progress Notes (Addendum)
Patient presents without an appt with hypotension and low grade temp.  BP 110/48 with repeat 110/58. Patients feels "foggy". Dizzy with head movement.  Plan for lab work due to recent hospitalization for sepsis and pyelonephritis.  Discussed with Dr. Denman George.  Plan for normal saline bolus of 250 cc.

## 2017-12-29 ENCOUNTER — Encounter: Payer: Self-pay | Admitting: Gynecologic Oncology

## 2017-12-29 LAB — URINE CULTURE: Culture: <10000 — AB

## 2017-12-29 MED ORDER — SODIUM CHLORIDE 0.9 % IV SOLN
INTRAVENOUS | Status: AC
  Administered 2017-12-28: 13:00:00 via INTRAVENOUS
  Filled 2017-12-29: qty 250

## 2017-12-29 MED ORDER — SODIUM CHLORIDE 0.9 % IV SOLN: Freq: Once | INTRAVENOUS | Status: DC

## 2017-12-29 NOTE — Addendum Note (Signed)
Addended by: Joylene John D on: 12/29/2017 12:47 PM   Modules accepted: Orders

## 2017-12-29 NOTE — Patient Instructions (Signed)
We will contact you with your lab results. Plan to follow up with Dr. Melford Aase.

## 2017-12-29 NOTE — Progress Notes (Signed)
Alison Alvarez 57 y.o. female  CC:  Chief Complaint  Patient presents with  . Hypotension/Dizziness    Symptom Management   HPI: Alison Alvarez is a 57 year old female recently admitted for sepsis related to pyelonephritis.  Blood cultures were positive x 2 for e coli.  Prior to admission, she was febrile with temp of 102 and 103, lethargic.  Reported that her urine was frothy for several weeks but a urine culture obtained at her GYN office was negative.  She received IV antibiotics, IV hydration and her creatinine improved along with her liver function tests.  She was discharged home on oral antibiotics.    Interval History: She presents today with complaints of mild dizziness and feeling "foggy."  She feels weak intermittently but denies change in gait or falls.  Appetite decreased since admission but no nausea or emesis.  States she is taking in adequate amounts of liquids.  She has had diarrhea for the past week and started taking probiotics.  No fever reported.  No vaginal bleeding reported. No dysuria, frequency, or urgency. No other concerns voiced.  Review of Systems: Constitutional: Feels foggy. No fever, chills.  Decreased appetite.  Cardiovascular: No chest pain, shortness of breath, or edema.  Pulmonary: No cough or wheeze.  Gastrointestinal: No nausea, vomiting, or diarrhea. No bright red blood per rectum or change in bowel movement.  Genitourinary: No frequency, urgency, or dysuria. No vaginal bleeding or discharge.  Musculoskeletal: No myalgia or joint pain. Neurologic: No weakness, numbness, or change in gait.  Psychology: No depression, anxiety, or insomnia.  Health Maintenance: Up to date  Current Meds:  Outpatient Encounter Medications as of 12/28/2017  Medication Sig  . acetaminophen (TYLENOL) 500 MG tablet Take 1 tablet (500 mg total) by mouth every 6 (six) hours as needed for mild pain, fever or headache.  . albuterol (PROVENTIL HFA;VENTOLIN HFA) 108 (90  Base) MCG/ACT inhaler Inhale 2 puffs into the lungs every 6 (six) hours as needed for wheezing or shortness of breath.  . ALPRAZolam (XANAX) 0.25 MG tablet Take 1 tablet (0.25 mg total) by mouth 3 (three) times daily.  [name] Products (PERIDRIN-C) 200-50-150 MG TABS Take 3 tablets by mouth daily.  . cetirizine (ZYRTEC ALLERGY) 10 MG tablet Take 10 mg by mouth daily.  . Coenzyme Q10 (COQ10) 200 MG CAPS Take 1 capsule by mouth daily.  Marland Kitchen ezetimibe-simvastatin (VYTORIN) 10-40 MG tablet Take 1 tablet by mouth at bedtime.  . fenofibrate micronized (LOFIBRA) 134 MG capsule Take 134 mg by mouth 3 (three) times a week.  Marland Kitchen HYDROcodone-acetaminophen (NORCO/VICODIN) 5-325 MG tablet Take 1 tablet by mouth every 6 (six) hours as needed for moderate pain.  Marland Kitchen levothyroxine (SYNTHROID, LEVOTHROID) 75 MCG tablet TAKE 1 TABLET BY MOUTH ONCE DAILY  . methocarbamol (ROBAXIN) 500 MG tablet Take 1 tablet (500 mg total) by mouth every 8 (eight) hours as needed for muscle spasms.  Marland Kitchen omeprazole (PRILOSEC) 20 MG capsule TAKE 1 CAPSULE BY MOUTH ONCE DAILY  . Vitamin D, Ergocalciferol, (DRISDOL) 1.25 MG (50000 UT) CAPS capsule Take 50,000 Units by mouth 2 (two) times a week. Takes every Monday and Friday   Facility-Administered Encounter Medications as of 12/28/2017  Medication  . 0.9 %  sodium chloride infusion  . [EXPIRED] 0.9 %  sodium chloride infusion    Allergy:  Allergies  Allergen Reactions  . Advicor [Niacin-Lovastatin Er] Swelling    Facial swelling  . Niacin-Lovastatin Er Swelling and Other (See Comments)  Extreme flushing    Social Hx:   Social History   Socioeconomic History  . Marital status: Married    Spouse name: Not on file  . Number of children: Not on file  . Years of education: Not on file  . Highest education level: Not on file  Occupational History  . Not on file  Social Needs  . Financial resource strain: Not on file  . Food insecurity:    Worry: Not on file     Inability: Not on file  . Transportation needs:    Medical: Not on file    Non-medical: Not on file  Tobacco Use  . Smoking status: Never Smoker  . Smokeless tobacco: Never Used  Substance and Sexual Activity  . Alcohol use: Yes    Comment: occasional alcohol intake  . Drug use: No  . Sexual activity: Not on file  Lifestyle  . Physical activity:    Days per week: Not on file    Minutes per session: Not on file  . Stress: Not on file  Relationships  . Social connections:    Talks on phone: Not on file    Gets together: Not on file    Attends religious service: Not on file    Active member of club or organization: Not on file    Attends meetings of clubs or organizations: Not on file    Relationship status: Not on file  . Intimate partner violence:    Fear of current or ex partner: Not on file    Emotionally abused: Not on file    Physically abused: Not on file    Forced sexual activity: Not on file  Other Topics Concern  . Not on file  Social History Narrative  . Not on file    Past Surgical Hx:  Past Surgical History:  Procedure Laterality Date  . BRAIN MENINGIOMA EXCISION  1965   at age 52  . BREAST EXCISIONAL BIOPSY Left   . BREAST MASS EXCISION     left breast, benign  . DIAGNOSTIC LAPAROSCOPY    . SYNOVIAL CYST EXCISION  2010   l5, s1 left hip  . TONSILLECTOMY    . wisom teeth  1984    Past Medical Hx:  Past Medical History:  Diagnosis Date  . Acne   . Anxiety   . Arthritis   . Cervical polyp   . Chronic back pain   . Depression   . Endometriosis   . GERD (gastroesophageal reflux disease)   . Hyperlipidemia   . Hypothyroidism, adult    hypothyroidism  . PCO (polycystic ovaries)   . Plantar fasciitis   . Post-menopausal   . Pre-diabetes     Family Hx:  Family History  Problem Relation Age of Onset  . Dementia Mother   . Osteoporosis Mother   . Heart disease Father   . Parkinson's disease Father   . Stroke Father   . Diabetes Father   .  Hyperlipidemia Father   . Hypertension Father   . Cancer Paternal Aunt        breast  . Breast cancer Paternal Aunt   . Cancer Maternal Grandfather        brain    Vitals:  BP 108/48, P 68, Temp 98.8, R 22, O2 98 on RA  Physical Exam:  General: Well developed, well nourished female in no acute distress. Alert and oriented x 3.  Cardiovascular: Regular rate and rhythm. S1 and S2 normal.  Lungs: Clear to auscultation bilaterally. No wheezes/crackles/rhonchi noted.  Skin: No rashes or lesions present. Back: No CVA tenderness.  Abdomen: Abdomen soft, non-tender and obese. Active bowel sounds in all quadrants. No evidence of a fluid wave or abdominal masses.   Extremities: No bilateral cyanosis, edema, or clubbing.   Assessment/Plan: 57 year old female recently admitted for sepsis with symptoms of mild dizziness and fogginess. Patient has reached out to PCP but has not received a return call.  Plan to go ahead and check a CBC, Cmet, and urine sample today.  Plan to administer 500 cc normal saline bolus due to symptomatic hypotension to run over 2 hours.  Reviewed with Dr. Denman George. Labs will be routed to Dr. Melford Aase. Patient will be contacted with the results. No concerns voiced at the end of the visit.   500 cc normal saline bolus tolerated with no difficulties.  IV inserted sterilely by VD, RN.  Dorothyann Gibbs, NP 12/31/2017, 3:21 PM  Update 12/28/17 at 3pm Labs reviewed with the patient.  Will contact her with results of urine culture when available: . CBC    Component Value Date/Time   WBC 3.4 (L) 12/28/2017 1459   WBC 5.7 12/21/2017 1239   RBC 3.42 (L) 12/28/2017 1459   HGB 10.7 (L) 12/28/2017 1459   HCT 33.6 (L) 12/28/2017 1459   PLT 527 (H) 12/28/2017 1459   MCV 98.2 12/28/2017 1459   MCH 31.3 12/28/2017 1459   MCHC 31.8 12/28/2017 1459   RDW 13.5 12/28/2017 1459   LYMPHSABS 1.2 12/28/2017 1459   MONOABS 0.3 12/28/2017 1459   EOSABS 0.1 12/28/2017 1459   BASOSABS 0.1  12/28/2017 1459   CMP Latest Ref Rng & Units 12/28/2017 12/21/2017 12/17/2017  Glucose 70 - 99 mg/dL 121(H) 90 112(H)  BUN 6 - 20 mg/dL 9 17 11   Creatinine 0.44 - 1.00 mg/dL 0.91 0.75 0.94  Sodium 135 - 145 mmol/L 142 137 138  Potassium 3.5 - 5.1 mmol/L 3.8 4.2 4.1  Chloride 98 - 111 mmol/L 106 99 105  CO2 22 - 32 mmol/L 27 28 25   Calcium 8.9 - 10.3 mg/dL 9.6 9.8 8.9  Total Protein 6.5 - 8.1 g/dL 7.1 6.9 6.0(L)  Total Bilirubin 0.3 - 1.2 mg/dL 0.3 0.6 0.6  Alkaline Phos 38 - 126 U/L 117 - 166(H)  AST 15 - 41 U/L 20 21 33  ALT 0 - 44 U/L 26 55(H) 112(H)

## 2018-01-17 NOTE — Progress Notes (Signed)
Assessment and Plan:  Cecylia was seen today for follow-up.  Diagnoses and all orders for this visit:  Pleural effusion, bilateral Clear to auscultation; resolved  Medication management -     CBC with Differential/Platelet -     COMPLETE METABOLIC PANEL WITH GFR -     Urinalysis w microscopic + reflex cultur  Anemia due to other cause, not classified -     CBC with Differential/Platelet  Elevated liver enzymes Recheck CMP, off of cholesterol medicaiton  Mixed hyperlipidemia Off of cholesterol medication due to recent sepsis and feeling less mental fog and body aches Will check cholesterol today off of medications, consider restarting non-combo agent at lower doses Currently only taking fenofibrate three days a week -     COMPLETE METABOLIC PANEL WITH GFR -     Lipid panel  Pre-diabetes Discussed disease and risks Discussed diet/exercise, weight management  -     Hemoglobin A1c  Other orders -     fexofenadine (ALLEGRA ALLERGY) 180 MG tablet; Take 1 tablet (180 mg total) by mouth daily.  Further disposition pending results of labs. Discussed med's effects and SE's.   Over 30 minutes of exam, counseling, chart review, and critical decision making was performed.   Future Appointments  Date Time Provider [place]  02/02/2018  4:40 PM [facility]  04/13/2018  2:30 PM Vicie Mutters, PA-C [facility] None  09/28/2018  3:00 PM Vicie Mutters, PA-C [facility] None   ------------------------------------------------------------------------------------------------------------------   HPI 57 y.o.female presents for 1 month follow up for labs and medication question.   She was recently admitted to hospital for SIRS/sepsis secondary to E. Coli and was treated with aggressive IV hydration and abx. She also had anemia presumed dilutional, and elevated LFTs (AST 111, ALT 325 initially on 12/20/2017) that were attributed to sepsis after unremarkable workup  while admitted CT abd and MRCP.   She presents today much improved with no notable symptoms or complaints.  She does endorse she has been off of cholesterol meds due to elevated LFT, only taking fenofibrate three days a week, and reports less mental fog and body aches which she had not previously associated with cholesterol meds.   CBC Latest Ref Rng & Units 12/28/2017 12/21/2017 12/17/2017  WBC 4.0 - 10.5 K/uL 3.4(L) 5.7 7.9  Hemoglobin 12.0 - 15.0 g/dL 10.7(L) 11.4(L) 8.9(L)  Hematocrit 36.0 - 46.0 % 33.6(L) 35.2 28.1(L)  Platelets 150 - 400 K/uL 527(H) 702(H) 378    Lab Results  Component Value Date   ALT 26 12/28/2017   AST 20 12/28/2017   ALKPHOS 117 12/28/2017   BILITOT 0.3 12/28/2017     She is on cholesterol medication but has been off due to recent elevated LFTs and reports she has less body achiness/mental fog. Her cholesterol is at goal. The cholesterol last visit was:   Lab Results  Component Value Date   CHOL 185 09/22/2017   HDL 67 09/22/2017   LDLCALC 101 (H) 09/22/2017   TRIG 79 09/22/2017   CHOLHDL 2.8 09/22/2017   Her blood pressure has been controlled at home, today their BP is BP: 122/72  She does workout. She denies chest pain, shortness of breath, dizziness.   She has been working on diet and exercise for prediabetes, and denies increased appetite, nausea, paresthesia of the feet, polydipsia, polyuria and visual disturbances. Last A1C in the office was:  Lab Results  Component Value Date   HGBA1C 5.4 09/01/2016     Past  Medical History:  Diagnosis Date  . Acne   . Anxiety   . Arthritis   . Cervical polyp   . Chronic back pain   . Depression   . Endometriosis   . GERD (gastroesophageal reflux disease)   . Hyperlipidemia   . Hypothyroidism, adult    hypothyroidism  . PCO (polycystic ovaries)   . Plantar fasciitis   . Post-menopausal   . Pre-diabetes      Allergies  Allergen Reactions  . Advicor [Niacin-Lovastatin Er] Swelling    Facial  swelling  . Niacin-Lovastatin Er Swelling and Other (See Comments)    Extreme flushing    Current Outpatient Medications on File Prior to Visit  Medication Sig  . acetaminophen (TYLENOL) 500 MG tablet Take 1 tablet (500 mg total) by mouth every 6 (six) hours as needed for mild pain, fever or headache.  . albuterol (PROVENTIL HFA;VENTOLIN HFA) 108 (90 Base) MCG/ACT inhaler Inhale 2 puffs into the lungs every 6 (six) hours as needed for wheezing or shortness of breath.  . ALPRAZolam (XANAX) 0.25 MG tablet Take 1 tablet (0.25 mg total) by mouth 3 (three) times daily.  [name] Products (PERIDRIN-C) 200-50-150 MG TABS Take 3 tablets by mouth daily.  . cetirizine (ZYRTEC ALLERGY) 10 MG tablet Take 10 mg by mouth daily.  . Coenzyme Q10 (COQ10) 200 MG CAPS Take 1 capsule by mouth daily.  Marland Kitchen HYDROcodone-acetaminophen (NORCO/VICODIN) 5-325 MG tablet Take 1 tablet by mouth every 6 (six) hours as needed for moderate pain.  Marland Kitchen levothyroxine (SYNTHROID, LEVOTHROID) 75 MCG tablet TAKE 1 TABLET BY MOUTH ONCE DAILY  . omeprazole (PRILOSEC) 20 MG capsule TAKE 1 CAPSULE BY MOUTH ONCE DAILY  . Vitamin D, Ergocalciferol, (DRISDOL) 1.25 MG (50000 UT) CAPS capsule Take 50,000 Units by mouth 2 (two) times a week. Takes every Monday and Friday  . ezetimibe-simvastatin (VYTORIN) 10-40 MG tablet Take 1 tablet by mouth at bedtime.  . fenofibrate micronized (LOFIBRA) 134 MG capsule Take 134 mg by mouth 3 (three) times a week.   Current Facility-Administered Medications on File Prior to Visit  Medication  . 0.9 %  sodium chloride infusion    ROS: all negative except above.   Physical Exam:  BP 122/72   Pulse 65   Temp (!) 97.3 F (36.3 C)   Ht 5' 1.5" (1.562 m)   Wt 154 lb 3.2 oz (69.9 kg)   SpO2 98%   BMI 28.66 kg/m   General Appearance: Well nourished, in no apparent distress. Eyes: PERRLA, EOMs, conjunctiva no swelling or erythema Sinuses: No Frontal/maxillary tenderness ENT/Mouth: Ext aud canals  clear, TMs without erythema, bulging. No erythema, swelling, or exudate on post pharynx.  Tonsils not swollen or erythematous. Hearing normal.  Neck: Supple, thyroid normal.  Respiratory: Respiratory effort normal, BS equal bilaterally without rales, rhonchi, wheezing or stridor.  Cardio: RRR with no MRGs. Brisk peripheral pulses without edema.  Abdomen: Soft, + BS.  Non tender, no guarding, rebound, hernias, masses. Lymphatics: Non tender without lymphadenopathy.  Musculoskeletal: Full ROM, 5/5 strength, normal gait.  Skin: Warm, dry without rashes, lesions, ecchymosis.  Neuro: Cranial nerves intact. Normal muscle tone, no cerebellar symptoms. Sensation intact.  Psych: Awake and oriented X 3, normal affect, Insight and Judgment appropriate.     Izora Ribas, NP 11:15 AM [facility]

## 2018-01-24 ENCOUNTER — Ambulatory Visit: Payer: 59 | Admitting: Adult Health

## 2018-01-24 ENCOUNTER — Encounter: Payer: Self-pay | Admitting: Adult Health

## 2018-01-24 ENCOUNTER — Ambulatory Visit: Payer: Self-pay

## 2018-01-24 VITALS — BP 122/72 | HR 65 | Temp 97.3°F | Ht 61.5 in | Wt 154.2 lb

## 2018-01-24 DIAGNOSIS — D6489 Other specified anemias: Secondary | ICD-10-CM

## 2018-01-24 DIAGNOSIS — R748 Abnormal levels of other serum enzymes: Secondary | ICD-10-CM | POA: Diagnosis not present

## 2018-01-24 DIAGNOSIS — Z79899 Other long term (current) drug therapy: Secondary | ICD-10-CM

## 2018-01-24 DIAGNOSIS — R7303 Prediabetes: Secondary | ICD-10-CM

## 2018-01-24 DIAGNOSIS — J90 Pleural effusion, not elsewhere classified: Secondary | ICD-10-CM

## 2018-01-24 DIAGNOSIS — E782 Mixed hyperlipidemia: Secondary | ICD-10-CM

## 2018-01-24 MED ORDER — FEXOFENADINE HCL 180 MG PO TABS: 180.0000 mg | ORAL_TABLET | Freq: Every day | ORAL | 2 refills | Status: DC

## 2018-01-24 NOTE — Patient Instructions (Signed)
Statin Intolerance Statin intolerance is the inability to take a certain type of cholesterol-lowering medicine (statin) because of unwanted side effects, such as muscle pain. Statins may be prescribed to improve cholesterol levels and lower the risk for heart attack, heart disease, and stroke. People with statin intolerance may experience muscle pain and cramps (myalgia) that go away when the statin is stopped. What are the causes? The cause of this condition is not known. What increases the risk? You may be at higher risk for statin intolerance if you:  Take more than one type of cholesterol-lowering medicine at a time.  Need a higher than normal dosage of a statin.  Have a history of high CK (creatine kinase) in the blood. CK is an enzyme that is released when muscle tissue is damaged.  Are a woman.  Have a body size that is smaller than normal.  Are age 51 or older.  Drink a lot of alcohol.  Are of Asian descent.  Have kidney, liver, or muscle disease.  Have a low level of hormones that control how your body uses energy (hypothyroidism).  Take certain medicines, including: ? Certain medicines for mental illness (antipsychotics). ? Some types of antibiotics. ? Certain medicines used for blood pressure or heart disease. ? Medicines that reduce the activity of the body's disease-fighting system (immunosuppressants). ? Medicines to treat hepatitis C and HIV/AIDS (human immunodeficiency virus/acquired immunodeficiency syndrome).  Follow an intense exercise program.  Drink a lot of grapefruit juice.  Have a lack of vitamin D (deficiency). What are the signs or symptoms? Signs and symptoms of statin intolerance include:  General muscle aches (myalgia). This may feel similar to muscle aches that are caused by the flu.  Muscle pain, tenderness, cramps, or weakness (myositis).  Severe muscle pain, weakness, and raised blood CK levels (rhabdomyolysis). Symptoms usually go away  when the statin is stopped. Rarely, liver damage can also occur, which can cause:  Loss of appetite.  Pain in the upper right abdomen.  Yellowing of the skin or the white parts of the eyes (jaundice). How is this diagnosed? This condition is diagnosed based on your symptoms, your medical history, and a physical exam. You may also have blood tests. How is this treated? Your health care provider may have you stop taking the statin for a short time to see if your symptoms go away. Then your provider may restart your statin, or:  Change you to a different statin.  Lower the dosage of your statin.  Have you take your statin less often.  Change you to another type of cholesterol-lowering medicine.  Stop or change any medicines that might be interfering with your statin.  Limit how much grapefruit juice you drink.  Recommend stopping intense exercise. Follow these instructions at home: Medicines  Take your statin medicine as told by your health care provider. Do not stop taking the statin unless your health care provider tells you to stop.  Take other over-the-counter and prescription medicines only as told by your health care provider.  Check with your health care provider before taking any new medicines. Certain medicines can increase your risk for statin intolerance. Lifestyle  Limit alcohol intake to no more than 1 drink a day for nonpregnant women and 2 drinks a day for men. One drink equals 12 oz of beer, 5 oz of wine, or 1 oz of hard liquor.  Do not use any products that contain nicotine or tobacco, such as cigarettes and e-cigarettes. If you need help  quitting, ask your health care provider. General instructions   Have blood tests to check CK levels or liver enzymes as told by your health care provider.  Exercise as directed. Ask your health care provider what exercises are best for you. Do not start a new exercise program before talking about it with your health care  provider.  Follow instructions from your health care provider about eating or drinking restrictions. Your health care provider may recommend: ? Limiting the amount of grapefruit juice you drink, or not drinking it at all. ? Eating a diet that is low in saturated fats and high in fiber.  Maintain a healthy weight with diet and exercise.  Keep all follow-up visits as told by your health care provider. This is important. Contact a health care provider if:  You have any symptoms of statin intolerance. Summary  Statins are important medicines for improving your cholesterol, which may reduce your risk for heart attack and stroke.  Some people are not able to continue taking a particular statin because of muscle problems (myalgia) or other side effects.  Myalgia is the most common symptom of statin intolerance. Often, the muscle pain and cramps from myalgia go away when the statin is stopped.  Although rare, liver damage can occur as a result of statin intolerance. You should have routine blood tests to check your liver enzymes.  In most cases, statin intolerance can be managed and you can continue to take a cholesterol-lowering medicine. This information is not intended to replace advice given to you by your health care provider. Make sure you discuss any questions you have with your health care provider. Document Released: 06/25/2016 Document Revised: 06/25/2016 Document Reviewed: 06/25/2016 Elsevier Interactive Patient Education  2019 [employer].

## 2018-01-25 ENCOUNTER — Other Ambulatory Visit: Payer: Self-pay | Admitting: Adult Health

## 2018-01-25 MED ORDER — ROSUVASTATIN CALCIUM 40 MG PO TABS: ORAL_TABLET | ORAL | 1 refills | Status: DC

## 2018-01-25 MED FILL — ROSUVASTATIN CALCIUM 40 MG: 40 | 90 days supply | Qty: 45 | Fill #0

## 2018-01-25 MED FILL — OMEPRAZOLE 20 MG CPDR: 20 | 90 days supply | Qty: 90 | Fill #1

## 2018-01-26 LAB — CBC WITH DIFFERENTIAL/PLATELET
Absolute Monocytes: 425 cells/uL (ref 200–950)
Basophils Absolute: 29 cells/uL (ref 0–200)
Basophils Relative: 0.8 %
Eosinophils Absolute: 108 cells/uL (ref 15–500)
Eosinophils Relative: 3 %
HCT: 38.4 % (ref 35.0–45.0)
Hemoglobin: 13.1 g/dL (ref 11.7–15.5)
Lymphs Abs: 1037 cells/uL (ref 850–3900)
MCH: 32 pg (ref 27.0–33.0)
MCHC: 34.1 g/dL (ref 32.0–36.0)
MCV: 93.9 fL (ref 80.0–100.0)
MPV: 9.6 fL (ref 7.5–12.5)
Monocytes Relative: 11.8 %
Neutro Abs: 2002 cells/uL (ref 1500–7800)
Neutrophils Relative %: 55.6 %
PLATELETS: 355 10*3/uL (ref 140–400)
RBC: 4.09 10*6/uL (ref 3.80–5.10)
RDW: 12.7 % (ref 11.0–15.0)
Total Lymphocyte: 28.8 %
WBC: 3.6 10*3/uL — ABNORMAL LOW (ref 3.8–10.8)

## 2018-01-26 LAB — URINALYSIS W MICROSCOPIC + REFLEX CULTURE
Bacteria, UA: NONE SEEN /HPF
Bilirubin Urine: NEGATIVE
Glucose, UA: NEGATIVE
Hgb urine dipstick: NEGATIVE
Hyaline Cast: NONE SEEN /LPF
Ketones, ur: NEGATIVE
Nitrites, Initial: NEGATIVE
Protein, ur: NEGATIVE
RBC / HPF: NONE SEEN /HPF (ref 0–2)
Specific Gravity, Urine: 1.008 (ref 1.001–1.03)
Squamous Epithelial / HPF: NONE SEEN /HPF (ref <=5)
WBC, UA: NONE SEEN /HPF (ref 0–5)
pH: 7 (ref 5.0–8.0)

## 2018-01-26 LAB — COMPLETE METABOLIC PANEL WITH GFR
AG RATIO: 1.7 (calc) (ref 1.0–2.5)
ALT: 24 U/L (ref 6–29)
AST: 24 U/L (ref 10–35)
Albumin: 4.5 g/dL (ref 3.6–5.1)
Alkaline phosphatase (APISO): 73 U/L (ref 33–130)
BUN: 11 mg/dL (ref 7–25)
CO2: 29 mmol/L (ref 20–32)
Calcium: 10.2 mg/dL (ref 8.6–10.4)
Chloride: 100 mmol/L (ref 98–110)
Creat: 0.86 mg/dL (ref 0.50–1.05)
GFR, Est African American: 87 mL/min/{1.73_m2} (ref >=60)
GFR, Est Non African American: 75 mL/min/{1.73_m2} (ref >=60)
Globulin: 2.7 g/dL (calc) (ref 1.9–3.7)
Glucose, Bld: 88 mg/dL (ref 65–99)
Potassium: 4.2 mmol/L (ref 3.5–5.3)
Sodium: 139 mmol/L (ref 135–146)
Total Bilirubin: 0.5 mg/dL (ref 0.2–1.2)
Total Protein: 7.2 g/dL (ref 6.1–8.1)

## 2018-01-26 LAB — URINE CULTURE
MICRO NUMBER:: 91556256
Result:: NO GROWTH
SPECIMEN QUALITY:: ADEQUATE

## 2018-01-26 LAB — HEMOGLOBIN A1C
Hgb A1c MFr Bld: 5.4 % of total Hgb (ref <5.7)
Mean Plasma Glucose: 108 (calc)
eAG (mmol/L): 6 (calc)

## 2018-01-26 LAB — LIPID PANEL
Cholesterol: 377 mg/dL — ABNORMAL HIGH (ref <200)
HDL: 69 mg/dL (ref >50)
LDL Cholesterol (Calc): 273 mg/dL (calc) — ABNORMAL HIGH
Non-HDL Cholesterol (Calc): 308 mg/dL (calc) — ABNORMAL HIGH (ref <130)
Total CHOL/HDL Ratio: 5.5 (calc) — ABNORMAL HIGH (ref <5.0)
Triglycerides: 176 mg/dL — ABNORMAL HIGH (ref <150)

## 2018-01-26 LAB — CULTURE INDICATED

## 2018-01-31 ENCOUNTER — Other Ambulatory Visit: Payer: Self-pay | Admitting: Adult Health

## 2018-02-02 ENCOUNTER — Ambulatory Visit
Admission: RE | Admit: 2018-02-02 | Discharge: 2018-02-02 | Disposition: A | Discharge status: Home / Self Care | Payer: 59 | Source: Ambulatory Visit | Attending: Internal Medicine | Admitting: Internal Medicine

## 2018-02-02 DIAGNOSIS — Z1231 Encounter for screening mammogram for malignant neoplasm of breast: Secondary | ICD-10-CM

## 2018-02-09 MED FILL — VIT D2 1.25 MG (50,000 UNIT: 1.25 MG | 84 days supply | Qty: 24 | Fill #0

## 2018-02-28 ENCOUNTER — Other Ambulatory Visit: Payer: Self-pay | Admitting: Gynecologic Oncology

## 2018-02-28 ENCOUNTER — Ambulatory Visit: Payer: 59

## 2018-02-28 ENCOUNTER — Other Ambulatory Visit: Payer: Self-pay | Admitting: *Deleted

## 2018-02-28 DIAGNOSIS — R6889 Other general symptoms and signs: Secondary | ICD-10-CM

## 2018-02-28 DIAGNOSIS — J111 Influenza due to unidentified influenza virus with other respiratory manifestations: Secondary | ICD-10-CM

## 2018-02-28 LAB — INFLUENZA PANEL BY PCR (TYPE A & B)
Influenza A By PCR: NEGATIVE
Influenza B By PCR: NEGATIVE

## 2018-02-28 MED ORDER — OSELTAMIVIR PHOSPHATE 75 MG PO CAPS: 75.0000 mg | ORAL_CAPSULE | Freq: Two times a day (BID) | ORAL | 0 refills | Status: DC

## 2018-02-28 MED FILL — OSELTAMIVIR PHOSPHATE 75 MG: 75 | 7 days supply | Qty: 14 | Fill #0

## 2018-02-28 NOTE — Progress Notes (Signed)
Patient presents with flu symptoms without an appt.  Recent exposure to patient with positive testing to influenza A. Skin pale and clammy. Plan for influenza screening.

## 2018-02-28 NOTE — Progress Notes (Signed)
Patient presents with flu symptoms after exposure with patient.  Tamiflu sent in per Stephania Fragmin with ID.

## 2018-03-04 ENCOUNTER — Other Ambulatory Visit: Payer: Self-pay | Admitting: Physician Assistant

## 2018-03-04 MED FILL — LEVOTHYROXINE 75 MCG TABLET: 75 | 90 days supply | Qty: 90 | Fill #0

## 2018-03-17 MED FILL — MOMETASONE FUROATE 50 MCG S: 50 | 60 days supply | Qty: 17 | Fill #2

## 2018-04-12 ENCOUNTER — Other Ambulatory Visit: Payer: Self-pay | Admitting: Adult Health

## 2018-04-13 ENCOUNTER — Ambulatory Visit: Payer: Self-pay | Admitting: Physician Assistant

## 2018-04-25 MED FILL — ROSUVASTATIN CALCIUM 40 MG: 40 | 90 days supply | Qty: 45 | Fill #1

## 2018-04-25 MED FILL — OMEPRAZOLE 20 MG CPDR: 20 | 90 days supply | Qty: 90 | Fill #2

## 2018-04-25 MED FILL — FLUoxetine HCL 20 MG CAPS: 20 | 90 days supply | Qty: 180 | Fill #1

## 2018-04-25 MED FILL — VIT D2 1.25 MG (50,000 UNIT: 1.25 MG | 84 days supply | Qty: 24 | Fill #1

## 2018-05-03 ENCOUNTER — Ambulatory Visit: Payer: Self-pay | Admitting: Physician Assistant

## 2018-05-04 ENCOUNTER — Ambulatory Visit: Payer: Self-pay | Admitting: Physician Assistant

## 2018-05-16 ENCOUNTER — Ambulatory Visit: Payer: Self-pay | Admitting: Adult Health

## 2018-05-19 MED FILL — LEVOTHYROXINE 75 MCG TABLET: 75 | 90 days supply | Qty: 90 | Fill #1

## 2018-06-09 DIAGNOSIS — E663 Overweight: Secondary | ICD-10-CM | POA: Insufficient documentation

## 2018-06-09 DIAGNOSIS — K219 Gastro-esophageal reflux disease without esophagitis: Secondary | ICD-10-CM | POA: Insufficient documentation

## 2018-06-09 NOTE — Progress Notes (Signed)
Assessment and Plan:   Mixed hyperlipidemia Severe elevations at last visit off of medication; myalgias with vytorin and fenofibrate; trialing rosuvastatin 40 mg every other day, did well first month but now having mild myalgias again Check CK; with + family history of cardiac disease; discussed possible need for referral to lipid clinic for PCSK-9 inhibitor If modest progress with current dose and CK not severely elevated she requests defer of referral Can try adding zetia with lower dose statin Pending labs-  -      CK -     COMPLETE METABOLIC PANEL WITH GFR -     Lipid panel  History of Pre-diabetes Discussed disease and risks Discussed diet/exercise, weight management  A1C at goal last visit; check CMP; defer A1C to CPE  Hypothyroidism continue medications the same pending lab results reminded to take on an empty stomach 30-10mins before food.  check TSH level  Vitamin D deficiency At goal at recent check; continue to recommend supplementation for goal of 60-100 Defer vitamin D level to CPE  Anxiety Well managed by current regimen; continue medications; rare use of benzo  Stress management techniques discussed, increase water, good sleep hygiene discussed, increase exercise, and increase veggies.   GERD Well managed on current medications; on PPI due to hiatal hernia, failed taper to H2i Discussed diet, avoiding triggers and other lifestyle changes  Medication management -     CBC with Differential/Platelet -     COMPLETE METABOLIC PANEL WITH GFR  -     Magnesium   Acne/folliculitis After discussion of several options incuding tretinoin, topical abx, spironolactone will restart oral abx; sent in doxycycline 100 mg to take daily, plan for this to be short term until covid 19 resolves and she can resume electrolysis  Alternately may resume spironolactone but she prefers to avoid this today    Further disposition pending results of labs. Discussed med's effects and SE's.    Over 58 minutes of exam, counseling, chart review, and critical decision making was performed.   Future Appointments  Date Time Provider [place]  8/58/2020  3:00 PM Liane Comber, NP [facility] None   ------------------------------------------------------------------------------------------------------------------   HPI 58 y.o.female presents for 6 month follow up for hyperlipidemia, hypothyroid, glucose management (hx of prediabetes), vitamin D deficiency and anxiety.   She has cystic acne and folliculitis secondary to PCOS/hirstutism, typically gets electrolysis which improves significantly but has been unable to get this done and presents with flare today. She has also been on keflex and spironolactone in the past with good results.   she has a diagnosis of anxiety and is currently on xanax 0.25 mg PRN, reports symptoms are well controlled on current regimen. she reports she uses rarely, typically when flying.   she has a diagnosis of GERD which is currently managed by omeprazole 20 mg due to GERD with hiatal hernia, has failed attempt to taper off of PPI.  she reports symptoms is currently well controlled, and denies breakthrough reflux, burning in chest, hoarseness or cough.    She was admitted to hospital in 11/2017 for SIRS/sepsis secondary to E. Coli and was treated with aggressive IV hydration and abx. She also had anemia presumed dilutional, and elevated LFTs (AST 111, ALT 325 initially on 12/20/2017) that were attributed to sepsis after unremarkable workup while admitted CT abd and MRCP. CBC and LFTs have since resolved to baseline at last check.   She was off of cholesterol medications last visit and noted significantly improved myalgias/arthralgias and fatigue.  She is on cholesterol medication, newly trying rosuvastatin 40 mg every other day, still having myalgias though she feels they are mild and tolerable. She had been on vytorin, fenofibrate for many years.   Her cholesterol is not at goal. The cholesterol last visit was:   Lab Results  Component Value Date   CHOL 377 (H) 01/24/2018   HDL 69 01/24/2018   LDLCALC 273 (H) 01/24/2018   TRIG 176 (H) 01/24/2018   CHOLHDL 5.5 (H) 01/24/2018   Her blood pressure has been controlled at home, today their BP is BP: 110/68  She does workout. She denies chest pain, shortness of breath, dizziness.   She has been working on diet and exercise for history of prediabetes, and denies increased appetite, nausea, paresthesia of the feet, polydipsia, polyuria and visual disturbances. Last A1C in the office was:  Lab Results  Component Value Date   HGBA1C 5.4 01/24/2018   She is on thyroid medication. Her medication was not changed last visit.   Lab Results  Component Value Date   TSH 0.59 09/22/2017   Patient is on Vitamin D supplement.   Lab Results  Component Value Date   VD25OH 63 09/22/2017       Past Medical History:  Diagnosis Date  . Acne   . Anxiety   . Arthritis   . Cervical polyp   . Chronic back pain   . Depression   . Endometriosis   . GERD (gastroesophageal reflux disease)   . Hyperlipidemia   . Hypothyroidism, adult    hypothyroidism  . PCO (polycystic ovaries)   . Plantar fasciitis   . Post-menopausal   . Pre-diabetes      Allergies  Allergen Reactions  . Advicor [Niacin-Lovastatin Er] Swelling    Facial swelling  . Niacin-Lovastatin Er Swelling and Other (See Comments)    Extreme flushing    Current Outpatient Medications on File Prior to Visit  Medication Sig  . acetaminophen (TYLENOL) 500 MG tablet Take 1 tablet (500 mg total) by mouth every 6 (six) hours as needed for mild pain, fever or headache.  . ALPRAZolam (XANAX) 0.25 MG tablet Take 1 tablet (0.25 mg total) by mouth 3 (three) times daily.  [name] Products (PERIDRIN-C) 200-50-150 MG TABS Take 3 tablets by mouth daily.  . cetirizine (ZYRTEC ALLERGY) 10 MG tablet Take 10 mg by mouth daily.  Marland Kitchen  HYDROcodone-acetaminophen (NORCO/VICODIN) 5-325 MG tablet Take 1 tablet by mouth every 6 (six) hours as needed for moderate pain.  Marland Kitchen levothyroxine (SYNTHROID, LEVOTHROID) 75 MCG tablet TAKE 1 TABLET BY MOUTH ONCE DAILY  . omeprazole (PRILOSEC) 20 MG capsule TAKE 1 CAPSULE BY MOUTH ONCE DAILY  . rosuvastatin (CRESTOR) 40 MG tablet Take 40 mg in the evening every other day or as directed for cholesterol.  . Vitamin D, Ergocalciferol, (DRISDOL) 1.25 MG (50000 UT) CAPS capsule TAKE 1 CAPSULE BY MOUTH TWICE A WEEK  . Coenzyme Q10 (COQ10) 200 MG CAPS Take 1 capsule by mouth daily.  . fexofenadine (ALLEGRA ALLERGY) 180 MG tablet Take 1 tablet (180 mg total) by mouth daily. (Patient not taking: Reported on 06/13/2018)   Current Facility-Administered Medications on File Prior to Visit  Medication  . 0.9 %  sodium chloride infusion    ROS: Review of Systems  Constitutional: Negative for malaise/fatigue and weight loss.  HENT: Negative for hearing loss and tinnitus.   Eyes: Negative for blurred vision and double vision.  Respiratory: Negative for cough, shortness of breath and wheezing.  Cardiovascular: Negative for chest pain, palpitations, orthopnea, claudication and leg swelling.  Gastrointestinal: Negative for abdominal pain, blood in stool, constipation, diarrhea, heartburn, melena, nausea and vomiting.  Genitourinary: Negative.   Musculoskeletal: Negative for joint pain and myalgias.  Skin: Negative for rash.  Neurological: Negative for dizziness, tingling, sensory change, weakness and headaches.  Endo/Heme/Allergies: Negative for polydipsia.  Psychiatric/Behavioral: Negative.   All other systems reviewed and are negative.    Physical Exam:  BP 110/68   Pulse 62   Temp (!) 97.2 F (36.2 C)   Ht 5' 1.5" (1.562 m)   Wt 158 lb (71.7 kg)   SpO2 98%   BMI 29.37 kg/m   General Appearance: Well nourished, in no apparent distress. Eyes: PERRLA, EOMs, conjunctiva no swelling or  erythema Sinuses: No Frontal/maxillary tenderness ENT/Mouth: Ext aud canals clear, TMs without erythema, bulging. No erythema, swelling, or exudate on post pharynx.  Tonsils not swollen or erythematous. Hearing normal.  Neck: Supple, thyroid normal.  Respiratory: Respiratory effort normal, BS equal bilaterally without rales, rhonchi, wheezing or stridor.  Cardio: RRR with no MRGs. Brisk peripheral pulses without edema.  Abdomen: Soft, + BS.  Non tender, no guarding, rebound, hernias, masses. Lymphatics: Non tender without lymphadenopathy.  Musculoskeletal: Full ROM, 5/5 strength, normal gait.  Skin: Warm, dry without rashes, ecchymosis. She has scattered folliculitis and cystic lesions to chin/neck.  Neuro: Cranial nerves intact. Normal muscle tone, no cerebellar symptoms. Sensation intact.  Psych: Awake and oriented X 3, normal affect, Insight and Judgment appropriate.     Izora Ribas, NP 1:06 PM [facility]

## 2018-06-13 ENCOUNTER — Encounter: Payer: Self-pay | Admitting: Adult Health

## 2018-06-13 ENCOUNTER — Other Ambulatory Visit: Payer: Self-pay

## 2018-06-13 ENCOUNTER — Ambulatory Visit: Payer: 59 | Admitting: Adult Health

## 2018-06-13 ENCOUNTER — Other Ambulatory Visit: Payer: Self-pay | Admitting: Adult Health

## 2018-06-13 VITALS — BP 110/68 | HR 62 | Temp 97.2°F | Ht 61.5 in | Wt 158.0 lb

## 2018-06-13 DIAGNOSIS — F419 Anxiety disorder, unspecified: Secondary | ICD-10-CM

## 2018-06-13 DIAGNOSIS — K219 Gastro-esophageal reflux disease without esophagitis: Secondary | ICD-10-CM

## 2018-06-13 DIAGNOSIS — M791 Myalgia, unspecified site: Secondary | ICD-10-CM

## 2018-06-13 DIAGNOSIS — E782 Mixed hyperlipidemia: Secondary | ICD-10-CM | POA: Diagnosis not present

## 2018-06-13 DIAGNOSIS — E079 Disorder of thyroid, unspecified: Secondary | ICD-10-CM

## 2018-06-13 DIAGNOSIS — G720 Drug-induced myopathy: Secondary | ICD-10-CM | POA: Insufficient documentation

## 2018-06-13 DIAGNOSIS — Z87898 Personal history of other specified conditions: Secondary | ICD-10-CM

## 2018-06-13 DIAGNOSIS — E559 Vitamin D deficiency, unspecified: Secondary | ICD-10-CM | POA: Diagnosis not present

## 2018-06-13 DIAGNOSIS — E663 Overweight: Secondary | ICD-10-CM

## 2018-06-13 DIAGNOSIS — Z79899 Other long term (current) drug therapy: Secondary | ICD-10-CM

## 2018-06-13 DIAGNOSIS — T466X5A Adverse effect of antihyperlipidemic and antiarteriosclerotic drugs, initial encounter: Secondary | ICD-10-CM | POA: Insufficient documentation

## 2018-06-13 MED ORDER — FLUOXETINE HCL 20 MG PO CAPS: 20.0000 mg | ORAL_CAPSULE | Freq: Two times a day (BID) | ORAL | 0 refills | Status: DC

## 2018-06-13 MED ORDER — DOXYCYCLINE HYCLATE 100 MG PO CAPS: ORAL_CAPSULE | ORAL | 0 refills | Status: DC

## 2018-06-13 MED ORDER — ALBUTEROL SULFATE HFA 108 (90 BASE) MCG/ACT IN AERS: 2.0000 | INHALATION_SPRAY | Freq: Four times a day (QID) | RESPIRATORY_TRACT | 2 refills | Status: DC | PRN

## 2018-06-13 MED FILL — ALBUTEROL SULFATE HFA 108 (: 108 (90 BAS | 25 days supply | Qty: 18 | Fill #0

## 2018-06-13 MED FILL — LINZESS 145 MCG CAPSULE: 145 | 90 days supply | Qty: 90 | Fill #1

## 2018-06-13 MED FILL — DOXYCYCLINE HYC 100 MG CAPS: 100 | 90 days supply | Qty: 90 | Fill #0

## 2018-06-13 NOTE — Patient Instructions (Addendum)
Statin Intolerance Statin intolerance is the inability to take a certain type of cholesterol-lowering medicine (statin) because of unwanted side effects, such as muscle pain. Statins may be prescribed to improve cholesterol levels and lower the risk for heart attack, heart disease, and stroke. People with statin intolerance may experience muscle pain and cramps (myalgia) that go away when the statin is stopped. What are the causes? The cause of this condition is not known. What increases the risk? You may be at higher risk for statin intolerance if you:  Take more than one type of cholesterol-lowering medicine at a time.  Need a higher than normal dosage of a statin.  Have a history of high CK (creatine kinase) in the blood. CK is an enzyme that is released when muscle tissue is damaged.  Are a woman.  Have a body size that is smaller than normal.  Are age 51 or older.  Drink a lot of alcohol.  Are of Asian descent.  Have kidney, liver, or muscle disease.  Have a low level of hormones that control how your body uses energy (hypothyroidism).  Take certain medicines, including: ? Certain medicines for mental illness (antipsychotics). ? Some types of antibiotics. ? Certain medicines used for blood pressure or heart disease. ? Medicines that reduce the activity of the body's disease-fighting system (immunosuppressants). ? Medicines to treat hepatitis C and HIV/AIDS (human immunodeficiency virus/acquired immunodeficiency syndrome).  Follow an intense exercise program.  Drink a lot of grapefruit juice.  Have a lack of vitamin D (deficiency). What are the signs or symptoms? Signs and symptoms of statin intolerance include:  General muscle aches (myalgia). This may feel similar to muscle aches that are caused by the flu.  Muscle pain, tenderness, cramps, or weakness (myositis).  Severe muscle pain, weakness, and raised blood CK levels (rhabdomyolysis). Symptoms usually go away  when the statin is stopped. Rarely, liver damage can also occur, which can cause:  Loss of appetite.  Pain in the upper right abdomen.  Yellowing of the skin or the white parts of the eyes (jaundice). How is this diagnosed? This condition is diagnosed based on your symptoms, your medical history, and a physical exam. You may also have blood tests. How is this treated? Your health care provider may have you stop taking the statin for a short time to see if your symptoms go away. Then your provider may restart your statin, or:  Change you to a different statin.  Lower the dosage of your statin.  Have you take your statin less often.  Change you to another type of cholesterol-lowering medicine.  Stop or change any medicines that might be interfering with your statin.  Limit how much grapefruit juice you drink.  Recommend stopping intense exercise. Follow these instructions at home: Medicines  Take your statin medicine as told by your health care provider. Do not stop taking the statin unless your health care provider tells you to stop.  Take other over-the-counter and prescription medicines only as told by your health care provider.  Check with your health care provider before taking any new medicines. Certain medicines can increase your risk for statin intolerance. Lifestyle  Limit alcohol intake to no more than 1 drink a day for nonpregnant women and 2 drinks a day for men. One drink equals 12 oz of beer, 5 oz of wine, or 1 oz of hard liquor.  Do not use any products that contain nicotine or tobacco, such as cigarettes and e-cigarettes. If you need help  quitting, ask your health care provider. General instructions   Have blood tests to check CK levels or liver enzymes as told by your health care provider.  Exercise as directed. Ask your health care provider what exercises are best for you. Do not start a new exercise program before talking about it with your health care  provider.  Follow instructions from your health care provider about eating or drinking restrictions. Your health care provider may recommend: ? Limiting the amount of grapefruit juice you drink, or not drinking it at all. ? Eating a diet that is low in saturated fats and high in fiber.  Maintain a healthy weight with diet and exercise.  Keep all follow-up visits as told by your health care provider. This is important. Contact a health care provider if:  You have any symptoms of statin intolerance. Summary  Statins are important medicines for improving your cholesterol, which may reduce your risk for heart attack and stroke.  Some people are not able to continue taking a particular statin because of muscle problems (myalgia) or other side effects.  Myalgia is the most common symptom of statin intolerance. Often, the muscle pain and cramps from myalgia go away when the statin is stopped.  Although rare, liver damage can occur as a result of statin intolerance. You should have routine blood tests to check your liver enzymes.  In most cases, statin intolerance can be managed and you can continue to take a cholesterol-lowering medicine. This information is not intended to replace advice given to you by your health care provider. Make sure you discuss any questions you have with your health care provider. Document Released: 06/25/2016 Document Revised: 06/25/2016 Document Reviewed: 06/25/2016 Elsevier Interactive Patient Education  2019 [place] injection What is this medicine? EVOLOCUMAB (e voe LOK ue mab) is known as a PCSK9 inhibitor. It is used to lower the level of cholesterol in the blood. It may be used alone or in combination with other cholesterol-lowering drugs. This drug may also be used to reduce the risk of heart attack, stroke, and certain types of heart surgery in patients with heart disease. This medicine may be used for other purposes; ask your health  care provider or pharmacist if you have questions. COMMON BRAND NAME(S): Repatha What should I tell my health care provider before I take this medicine? They need to know if you have any of these conditions: -an unusual or allergic reaction to evolocumab, other medicines, foods, dyes, or preservatives -pregnant or trying to get pregnant -breast-feeding How should I use this medicine? This medicine is for injection under the skin. You will be taught how to prepare and give this medicine. Use exactly as directed. Take your medicine at regular intervals. Do not take your medicine more often than directed. It is important that you put your used needles and syringes in a special sharps container. Do not put them in a trash can. If you do not have a sharps container, call your pharmacist or health care provider to get one. Talk to your pediatrician regarding the use of this medicine in children. While this drug may be prescribed for children as young as 13 years for selected conditions, precautions do apply. Overdosage: If you think you have taken too much of this medicine contact a poison control center or emergency room at once. NOTE: This medicine is only for you. Do not share this medicine with others. What if I miss a dose? If you miss a  dose, take it as soon as you can if there are more than 7 days until the next scheduled dose, or skip the missed dose and take the next dose according to your original schedule. Do not take double or extra doses. What may interact with this medicine? Interactions are not expected. This list may not describe all possible interactions. Give your health care provider a list of all the medicines, herbs, non-prescription drugs, or dietary supplements you use. Also tell them if you smoke, drink alcohol, or use illegal drugs. Some items may interact with your medicine. What should I watch for while using this medicine? You may need blood work while you are taking this  medicine. What side effects may I notice from receiving this medicine? Side effects that you should report to your doctor or health care professional as soon as possible: -allergic reactions like skin rash, itching or hives, swelling of the face, lips, or tongue -signs and symptoms of high blood sugar such as dizziness; dry mouth; dry skin; fruity breath; nausea; stomach pain; increased hunger or thirst; increased urination -signs and symptoms of infection like fever or chills; cough; sore throat; pain or trouble passing urine Side effects that usually do not require medical attention (report to your doctor or health care professional if they continue or are bothersome): -diarrhea -nausea -muscle pain -pain, redness, or irritation at site where injected This list may not describe all possible side effects. Call your doctor for medical advice about side effects. You may report side effects to FDA at 1-800-FDA-1088. Where should I keep my medicine? Keep out of the reach of children. You will be instructed on how to store this medicine. Throw away any unused medicine after the expiration date on the label. NOTE: This sheet is a summary. It may not cover all possible information. If you have questions about this medicine, talk to your doctor, pharmacist, or health care provider.  2019 Elsevier/Gold Standard (2016-08-24 13:31:00)   Doxycycline tablets or capsules What is this medicine? DOXYCYCLINE (dox i SYE kleen) is a tetracycline antibiotic. It kills certain bacteria or stops their growth. It is used to treat many kinds of infections, like dental, skin, respiratory, and urinary tract infections. It also treats acne, Lyme disease, malaria, and certain sexually transmitted infections. This medicine may be used for other purposes; ask your health care provider or pharmacist if you have questions. COMMON BRAND NAME(S): Acticlate, Adoxa, Adoxa CK, Adoxa Pak, Adoxa TT, Alodox, Avidoxy, Doxal,  LYMEPAK, Mondoxyne NL, Monodox, Morgidox 1x, Morgidox 1x Kit, Morgidox 2x, Morgidox 2x Kit, NutriDox, Ocudox, TARGADOX, Vibra-Tabs, Vibramycin What should I tell my health care provider before I take this medicine? They need to know if you have any of these conditions: -liver disease -long exposure to sunlight like working outdoors -stomach problems like colitis -an unusual or allergic reaction to doxycycline, tetracycline antibiotics, other medicines, foods, dyes, or preservatives -pregnant or trying to get pregnant -breast-feeding How should I use this medicine? Take this medicine by mouth with a full glass of water. Follow the directions on the prescription label. It is best to take this medicine without food, but if it upsets your stomach take it with food. Take your medicine at regular intervals. Do not take your medicine more often than directed. Take all of your medicine as directed even if you think you are better. Do not skip doses or stop your medicine early. Talk to your pediatrician regarding the use of this medicine in children. While this drug may  be prescribed for selected conditions, precautions do apply. Overdosage: If you think you have taken too much of this medicine contact a poison control center or emergency room at once. NOTE: This medicine is only for you. Do not share this medicine with others. What if I miss a dose? If you miss a dose, take it as soon as you can. If it is almost time for your next dose, take only that dose. Do not take double or extra doses. What may interact with this medicine? -antacids -barbiturates -birth control pills -bismuth subsalicylate -carbamazepine -methoxyflurane -other antibiotics -phenytoin -vitamins that contain iron -warfarin This list may not describe all possible interactions. Give your health care provider a list of all the medicines, herbs, non-prescription drugs, or dietary supplements you use. Also tell them if you smoke,  drink alcohol, or use illegal drugs. Some items may interact with your medicine. What should I watch for while using this medicine? Tell your doctor or health care professional if your symptoms do not improve. Do not treat diarrhea with over the counter products. Contact your doctor if you have diarrhea that lasts more than 2 days or if it is severe and watery. Do not take this medicine just before going to bed. It may not dissolve properly when you lay down and can cause pain in your throat. Drink plenty of fluids while taking this medicine to also help reduce irritation in your throat. This medicine can make you more sensitive to the sun. Keep out of the sun. If you cannot avoid being in the sun, wear protective clothing and use sunscreen. Do not use sun lamps or tanning beds/booths. Birth control pills may not work properly while you are taking this medicine. Talk to your doctor about using an extra method of birth control. If you are being treated for a sexually transmitted infection, avoid sexual contact until you have finished your treatment. Your sexual partner may also need treatment. Avoid antacids, aluminum, calcium, magnesium, and iron products for 4 hours before and 2 hours after taking a dose of this medicine. If you are using this medicine to prevent malaria, you should still protect yourself from contact with mosquitos. Stay in screened-in areas, use mosquito nets, keep your body covered, and use an insect repellent. What side effects may I notice from receiving this medicine? Side effects that you should report to your doctor or health care professional as soon as possible: -allergic reactions like skin rash, itching or hives, swelling of the face, lips, or tongue -difficulty breathing -fever -itching in the rectal or genital area -pain on swallowing -redness, blistering, peeling or loosening of the skin, including inside the mouth -severe stomach pain or cramps -unusual bleeding  or bruising -unusually weak or tired -yellowing of the eyes or skin Side effects that usually do not require medical attention (report to your doctor or health care professional if they continue or are bothersome): -diarrhea -loss of appetite -nausea, vomiting This list may not describe all possible side effects. Call your doctor for medical advice about side effects. You may report side effects to FDA at 1-800-FDA-1088. Where should I keep my medicine? Keep out of the reach of children. Store at room temperature, below 30 degrees C (86 degrees F). Protect from light. Keep container tightly closed. Throw away any unused medicine after the expiration date. Taking this medicine after the expiration date can make you seriously ill. NOTE: This sheet is a summary. It may not cover all possible information. If you have  questions about this medicine, talk to your doctor, pharmacist, or health care provider.  2019 Elsevier/Gold Standard (2015-02-13 17:11:22)

## 2018-06-14 ENCOUNTER — Encounter: Payer: Self-pay | Admitting: Adult Health

## 2018-06-14 ENCOUNTER — Other Ambulatory Visit: Payer: Self-pay | Admitting: Adult Health

## 2018-06-14 DIAGNOSIS — K760 Fatty (change of) liver, not elsewhere classified: Secondary | ICD-10-CM | POA: Insufficient documentation

## 2018-06-14 LAB — CBC WITH DIFFERENTIAL/PLATELET
Absolute Monocytes: 426 cells/uL (ref 200–950)
Basophils Absolute: 20 cells/uL (ref 0–200)
Basophils Relative: 0.6 %
Eosinophils Absolute: 69 cells/uL (ref 15–500)
Eosinophils Relative: 2.1 %
HCT: 39 % (ref 35.0–45.0)
Hemoglobin: 13.2 g/dL (ref 11.7–15.5)
Lymphs Abs: 1148 cells/uL (ref 850–3900)
MCH: 31.7 pg (ref 27.0–33.0)
MCHC: 33.8 g/dL (ref 32.0–36.0)
MCV: 93.5 fL (ref 80.0–100.0)
MPV: 9.9 fL (ref 7.5–12.5)
Monocytes Relative: 12.9 %
Neutro Abs: 1637 cells/uL (ref 1500–7800)
Neutrophils Relative %: 49.6 %
Platelets: 268 10*3/uL (ref 140–400)
RBC: 4.17 10*6/uL (ref 3.80–5.10)
RDW: 13.1 % (ref 11.0–15.0)
Total Lymphocyte: 34.8 %
WBC: 3.3 10*3/uL — ABNORMAL LOW (ref 3.8–10.8)

## 2018-06-14 LAB — COMPLETE METABOLIC PANEL WITH GFR
AG Ratio: 2.3 (calc) (ref 1.0–2.5)
ALT: 40 U/L — ABNORMAL HIGH (ref 6–29)
AST: 35 U/L (ref 10–35)
Albumin: 4.6 g/dL (ref 3.6–5.1)
Alkaline phosphatase (APISO): 58 U/L (ref 37–153)
BUN: 11 mg/dL (ref 7–25)
CO2: 31 mmol/L (ref 20–32)
Calcium: 9.6 mg/dL (ref 8.6–10.4)
Chloride: 100 mmol/L (ref 98–110)
Creat: 0.67 mg/dL (ref 0.50–1.05)
GFR, Est African American: 112 mL/min/{1.73_m2} (ref >=60)
GFR, Est Non African American: 97 mL/min/{1.73_m2} (ref >=60)
Globulin: 2 g/dL (calc) (ref 1.9–3.7)
Glucose, Bld: 79 mg/dL (ref 65–99)
Potassium: 4 mmol/L (ref 3.5–5.3)
Sodium: 136 mmol/L (ref 135–146)
Total Bilirubin: 0.4 mg/dL (ref 0.2–1.2)
Total Protein: 6.6 g/dL (ref 6.1–8.1)

## 2018-06-14 LAB — TSH: TSH: 1.79 mIU/L (ref 0.40–4.50)

## 2018-06-14 LAB — LIPID PANEL
Cholesterol: 199 mg/dL (ref <200)
HDL: 55 mg/dL (ref >=50)
LDL Cholesterol (Calc): 121 mg/dL (calc) — ABNORMAL HIGH
Non-HDL Cholesterol (Calc): 144 mg/dL (calc) — ABNORMAL HIGH (ref <130)
Total CHOL/HDL Ratio: 3.6 (calc) (ref <5.0)
Triglycerides: 124 mg/dL (ref <150)

## 2018-06-14 LAB — CK: Total CK: 201 U/L — ABNORMAL HIGH (ref 29–143)

## 2018-06-14 LAB — MAGNESIUM: Magnesium: 2.1 mg/dL (ref 1.5–2.5)

## 2018-06-14 MED ORDER — EZETIMIBE 10 MG PO TABS: 10.0000 mg | ORAL_TABLET | Freq: Every day | ORAL | 1 refills | Status: DC

## 2018-06-14 MED ORDER — ROSUVASTATIN CALCIUM 5 MG PO TABS: ORAL_TABLET | ORAL | 1 refills | Status: DC

## 2018-06-14 MED FILL — ROSUVASTATIN CALCIUM 5 MG T: 5 | 84 days supply | Qty: 33 | Fill #0

## 2018-06-14 MED FILL — EZETIMIBE 10 MG TABS: 10 | 90 days supply | Qty: 90 | Fill #0

## 2018-08-06 ENCOUNTER — Other Ambulatory Visit: Payer: Self-pay | Admitting: Internal Medicine

## 2018-08-08 MED FILL — VIT D2 1.25 MG (50,000 UNIT: 1.25 MG | 84 days supply | Qty: 24 | Fill #0

## 2018-08-25 ENCOUNTER — Other Ambulatory Visit: Payer: Self-pay | Admitting: Physician Assistant

## 2018-08-25 DIAGNOSIS — K219 Gastro-esophageal reflux disease without esophagitis: Secondary | ICD-10-CM

## 2018-08-26 ENCOUNTER — Other Ambulatory Visit: Payer: Self-pay | Admitting: Internal Medicine

## 2018-08-26 MED ORDER — FLUOXETINE HCL 20 MG PO CAPS: ORAL_CAPSULE | ORAL | 1 refills | Status: DC

## 2018-08-26 MED ORDER — LEVOTHYROXINE SODIUM 75 MCG PO TABS: ORAL_TABLET | ORAL | 1 refills | Status: DC

## 2018-08-26 MED FILL — LEVOTHYROXINE 75 MCG TABLET: 75 | 90 days supply | Qty: 90 | Fill #0

## 2018-08-26 MED FILL — OMEPRAZOLE 20 MG CAP: 20 | 90 days supply | Qty: 90 | Fill #0

## 2018-08-26 MED FILL — FLUoxetine HCL 20 MG CAPS: 20 | 90 days supply | Qty: 180 | Fill #0

## 2018-08-30 ENCOUNTER — Encounter: Payer: 59 | Admitting: Adult Health

## 2018-09-28 ENCOUNTER — Encounter: Payer: Self-pay | Admitting: Physician Assistant

## 2018-10-17 ENCOUNTER — Encounter: Payer: Self-pay | Admitting: Adult Health

## 2018-10-17 NOTE — Progress Notes (Signed)
Complete Physical  Assessment and Plan:  Alison Alvarez was seen today for annual exam.  Diagnoses and all orders for this visit:  Encounter for routine adult health examination with abnormal findings  Gastroesophageal reflux disease, esophagitis presence not specified Well managed on current medications Discussed diet, avoiding triggers and other lifestyle changes -     COMPLETE METABOLIC PANEL WITH GFR -     Magnesium  Fatty liver Weight loss advised, avoid alcohol/tylenol, will monitor LFTs -     COMPLETE METABOLIC PANEL WITH GFR  Thyroid disease continue medications the same pending lab results reminded to take on an empty stomach 30-83mins before food.  check TSH level -     TSH  History of prediabetes -     Hemoglobin A1c -     Urinalysis w microscopic + reflex cultur  Acne, unspecified acne type Improved with doxycycline 100 mg daily and low dose spironolactone;  Hygiene reviewed -     doxycycline (VIBRAMYCIN) 100 MG capsule; Take 1 capsule daily with food for acne. -     spironolactone (ALDACTONE) 50 MG tablet; Take 1 tablet (50 mg total) by mouth daily.  Anxiety Well managed by current regimen; continue medications Stress management techniques discussed, increase water, good sleep hygiene discussed, increase exercise, and increase veggies.   Mixed hyperlipidemia She would like to d/c statin if possible; ? Does tolerate rosuvastatin 5 mg twice weekly STOP rosuvastatin until myalgias resolve Given 1 week samples nexlizet to take instead of zetia She will message if tolerating and will send in script Follow up 3 months  -     TSH -     Lipid panel  Chronic low back pain without sciatica, unspecified back pain laterality Monitor, managing with OTC analgesics  Vitamin D deficiency -     VITAMIN D 25 Hydroxy (Vit-D Deficiency, Fractures)  Environmental allergies Continue OTC allergy pills  Overweight (BMI 25.0-29.9) Long discussion about weight loss, diet, and  exercise Recommended diet heavy in fruits and veggies and low in animal meats, cheeses, and dairy products, appropriate calorie intake Patient will work on - [employer] Discussed appropriate weight for height Follow up at next visit  Myalgia She feels r/t statin, improved when was off, would like to come off - see cholesterol comments  Family history of heart disease -     EKG 12-Lead  Discussed med's effects and SE's. Screening labs and tests as requested with regular follow-up as recommended. Over 40 minutes of exam, counseling, chart review, and complex, high level critical decision making was performed this visit.   Future Appointments  Date Time Provider [place]  01/24/2019  4:30 PM Liane Comber, NP [facility] None  10/18/2019  3:00 PM Liane Comber, NP [facility] None     HPI  58 y.o. female  presents for a complete physical and follow up for has Anxiety; Hyperlipidemia; Thyroid disease; History of prediabetes; Chronic back pain; Acne; Vitamin D deficiency; Environmental allergies; GERD (gastroesophageal reflux disease); Overweight (BMI 25.0-29.9); Myalgia; and Fatty liver on their problem list..  She was admitted to hospital in 11/2017 for SIRS/sepsis secondary to E. Coli and was treated with aggressive IV hydration and abx. She also had anemia presumed dilutional, and elevated LFTs (AST 111, ALT 325 initially on 12/20/2017) that were attributed to sepsis after unremarkable workup while admitted CT abd and MRCP. CBC and LFTs have since resolved to baseline at last check.   She has cystic acne and folliculitis secondary to PCOS/hirstutism, typically gets electrolysis which  improves significantly but has been unable to get this done. She is doing well with spironolactone 50 mg and doxycycline 100 mg daily.   she has a diagnosis of anxiety and is currently on xanax 0.25 mg PRN, reports symptoms are well controlled on current regimen. she reports she uses rarely,  typically when flying.   she has a diagnosis of GERD which is currently managed by omeprazole 20 mg due to GERD with hiatal hernia, has failed attempt to taper off of PPI.  she reports symptoms is currently well controlled, and denies breakthrough reflux, burning in chest, hoarseness or cough.    BMI is Body mass index is 28.52 kg/m., she has been working on diet and exercise, has restarted weight watchers and down 5 lbs, she is doing elliptical at [employer] at work [employer] from Last 3 Encounters:  10/18/18 153 lb 6.4 oz (69.6 kg)  06/13/18 158 lb (71.7 kg)  01/24/18 154 lb 3.2 oz (69.9 kg)   Her blood pressure has been controlled at home, today their BP is BP: 108/70 She does workout. She denies chest pain, shortness of breath, dizziness.   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 Her cholesterol is at goal. The cholesterol last visit was:   Lab Results  Component Value Date   CHOL 199 06/13/2018   HDL 55 06/13/2018   LDLCALC 121 (H) 06/13/2018   TRIG 124 06/13/2018   CHOLHDL 3.6 06/13/2018    Last A1C in the office was:  Lab Results  Component Value Date   HGBA1C 5.4 01/24/2018   Last GFR: Lab Results  Component Value Date   GFRNONAA 97 06/13/2018   Patient is on Vitamin D supplement.   Lab Results  Component Value Date   VD25OH 68 09/22/2017     She is on thyroid medication. Her medication was not  changed last visit.   Lab Results  Component Value Date   TSH 1.79 06/13/2018  .     Current Medications:  Current Outpatient Medications on File Prior to Visit  Medication Sig Dispense Refill  . acetaminophen (TYLENOL) 500 MG tablet Take 1 tablet (500 mg total) by mouth every 6 (six) hours as needed for mild pain, fever or headache. 30 tablet   . albuterol (VENTOLIN HFA) 108 (90 Base) MCG/ACT inhaler Inhale 2 puffs into the lungs every 6 (six) hours as needed for wheezing or shortness of breath. 1 Inhaler 2  . ALPRAZolam (XANAX) 0.25 MG tablet Take 1 tablet (0.25 mg total) by mouth 3 (three) times daily. 90 tablet 0  . Bioflavonoid Products (PERIDRIN-C) [id number] MG TABS Take 2 tablets by mouth daily.     . cetirizine (ZYRTEC ALLERGY) 10 MG tablet Take 10 mg by mouth daily.    Marland Kitchen ezetimibe (ZETIA) 10 MG tablet Take 1 tablet (10 mg total) by mouth daily. 90 tablet 1  . FLUoxetine (PROZAC) 20 MG capsule Take 1 capsule 2 x /day for Mood 180 capsule  1  . levothyroxine (SYNTHROID) 75 MCG tablet Take 1 tablet daily on an empty stomach with only water for 30 minutes & no Antacid meds, Calcium or Magnesium for 4 hours & avoid Biotin 90 tablet 1  . omeprazole (PRILOSEC) 20 MG capsule Take 1 capsule Daily for Heartburn & Reflux 90 capsule 2  . rosuvastatin (CRESTOR) 5 MG tablet Take 1 tab in the evening once weekly; then as tolerated, slowly increase up to 3 days a week. Stop and back down on dose with myalgias. 90 tablet 1  . Vitamin D, Ergocalciferol, (DRISDOL) 1.25 MG (50000 UT) CAPS capsule TAKE 1 CAPSULE BY MOUTH TWICE A WEEK 24 capsule 1   No current facility-administered medications on file prior to visit.    Allergies:  Allergies  Allergen Reactions  . Advicor [Niacin-Lovastatin Er] Swelling    Facial swelling  . Niacin-Lovastatin Er Swelling and Other (See Comments)    Extreme flushing   Medical History:  She has Anxiety; Hyperlipidemia; Thyroid disease; History of prediabetes;  Chronic back pain; Acne; Vitamin D deficiency; Environmental allergies; GERD (gastroesophageal reflux disease); Overweight (BMI 25.0-29.9); Myalgia; and Fatty liver on their problem list.   Health Maintenance:   Immunization History  Administered Date(s) Administered  . DTaP 01/27/2003  . Influenza-Unspecified 10/26/2012  . Tdap 07/26/2013   Tetanus: 2015 Pneumovax: Prevnar 13:  Flu vaccine: at work at [employer] Zostavax:   LMP: No LMP recorded. Patient is postmenopausal. Pap: March 2019 Dr. Gaetano Net, has scheduled upcoming  MGM: 01/2018 DEXA: 2015 Colonoscopy: 2012 due  EGD: 2012  Last vision exam: wears glasses, Dr. ?, last 2019 Last dental exam: goes q58m, 2019, scheduled next week  Patient Care Team: Unk Pinto, MD as PCP - General (Internal Medicine) Ladene Artist, MD as Consulting Physician (Gastroenterology) Everlene Farrier, MD as Consulting Physician (Obstetrics and Gynecology) Emelia Loron, MD as Referring Physician (Neurosurgery)  Surgical History:  She has a past surgical history that includes Synovial cyst excision (2010); Brain meningioma excision (1965); Tonsillectomy; wisom teeth (1984); Diagnostic laparoscopy; and Breast excisional biopsy (Left). Family History:  Herfamily history includes Breast cancer in her paternal aunt; Cancer in her maternal grandfather and paternal aunt; Dementia in her mother; Diabetes in her father; Endometrial cancer in her sister; Heart disease in her father; Hyperlipidemia in her brother and father; Hypertension in her brother and father; Osteoporosis in her mother; Parkinson's disease in her father; Stroke in her father. Social History:  She reports that she has never smoked. She has never used smokeless tobacco. She reports current alcohol use. She reports that she does not use drugs.  Review of Systems: Review of Systems  Constitutional: Negative for malaise/fatigue and weight loss.  HENT: Negative for hearing loss and tinnitus.    Eyes: Negative for blurred vision and double vision.  Respiratory: Negative for cough, sputum production, shortness of breath and wheezing.   Cardiovascular: Negative for chest pain, palpitations, orthopnea, claudication, leg swelling and PND.  Gastrointestinal: Negative for abdominal pain, blood in stool, constipation, diarrhea, heartburn, melena, nausea and vomiting.  Genitourinary: Negative.   Musculoskeletal: Negative for falls, joint pain and myalgias.  Skin: Negative for rash.  Neurological: Negative for dizziness, tingling, sensory change, weakness and headaches.  Endo/Heme/Allergies: Negative for polydipsia.  Psychiatric/Behavioral: Negative.  Negative for depression, memory loss, substance abuse and suicidal ideas. The patient is not nervous/anxious and does not have insomnia.   All other systems reviewed and are negative.   Physical Exam: Estimated body mass index is 28.52 kg/m  as calculated from the following:   Height as of this encounter: 5' 1.5" (1.562 m).   Weight as of this encounter: 153 lb 6.4 oz (69.6 kg). BP 108/70   Pulse 63   Temp (!) 97.3 F (36.3 C)   Ht 5' 1.5" (1.562 m)   Wt 153 lb 6.4 oz (69.6 kg)   SpO2 97%   BMI 28.52 kg/m  General Appearance: Well nourished, in no apparent distress.  Eyes: PERRLA, EOMs, conjunctiva no swelling or erythema, fundal exam deferred to ophth Sinuses: No Frontal/maxillary tenderness  ENT/Mouth: Ext aud canals clear, normal light reflex with TMs without erythema, bulging. Good dentition. No erythema, swelling, or exudate on post pharynx. Tonsils not swollen or erythematous. Hearing normal.  Neck: Supple, thyroid normal. No bruits  Respiratory: Respiratory effort normal, BS equal bilaterally without rales, rhonchi, wheezing or stridor.  Cardio: RRR without murmurs, rubs or gallops. Brisk peripheral pulses without edema.  Chest: symmetric, with normal excursions and percussion.  Breasts: Defer to GYN Abdomen: Soft, nontender,  no guarding, rebound, hernias, masses, or organomegaly.  Lymphatics: Non tender without lymphadenopathy.  Genitourinary: Defer to GYN Musculoskeletal: Full ROM all peripheral extremities,5/5 strength, and normal gait.  Skin: Warm, dry without rashes, lesions, ecchymosis. Neuro: Cranial nerves intact, reflexes equal bilaterally. Normal muscle tone, no cerebellar symptoms. Sensation intact.  Psych: Awake and oriented X 3, normal affect, Insight and Judgment appropriate.   EKG: Sinus brady no ST changes.  Izora Ribas 6:13 PM [facility]

## 2018-10-18 ENCOUNTER — Other Ambulatory Visit: Payer: Self-pay

## 2018-10-18 ENCOUNTER — Encounter: Payer: Self-pay | Admitting: Adult Health

## 2018-10-18 ENCOUNTER — Ambulatory Visit: Payer: 59 | Admitting: Adult Health

## 2018-10-18 VITALS — BP 108/70 | HR 63 | Temp 97.3°F | Ht 61.5 in | Wt 153.4 lb

## 2018-10-18 DIAGNOSIS — G8929 Other chronic pain: Secondary | ICD-10-CM

## 2018-10-18 DIAGNOSIS — Z0000 Encounter for general adult medical examination without abnormal findings: Secondary | ICD-10-CM | POA: Diagnosis not present

## 2018-10-18 DIAGNOSIS — F419 Anxiety disorder, unspecified: Secondary | ICD-10-CM

## 2018-10-18 DIAGNOSIS — Z8249 Family history of ischemic heart disease and other diseases of the circulatory system: Secondary | ICD-10-CM

## 2018-10-18 DIAGNOSIS — Z87898 Personal history of other specified conditions: Secondary | ICD-10-CM | POA: Diagnosis not present

## 2018-10-18 DIAGNOSIS — E559 Vitamin D deficiency, unspecified: Secondary | ICD-10-CM | POA: Diagnosis not present

## 2018-10-18 DIAGNOSIS — K760 Fatty (change of) liver, not elsewhere classified: Secondary | ICD-10-CM | POA: Diagnosis not present

## 2018-10-18 DIAGNOSIS — L709 Acne, unspecified: Secondary | ICD-10-CM

## 2018-10-18 DIAGNOSIS — E782 Mixed hyperlipidemia: Secondary | ICD-10-CM | POA: Diagnosis not present

## 2018-10-18 DIAGNOSIS — Z136 Encounter for screening for cardiovascular disorders: Secondary | ICD-10-CM

## 2018-10-18 DIAGNOSIS — K219 Gastro-esophageal reflux disease without esophagitis: Secondary | ICD-10-CM | POA: Diagnosis not present

## 2018-10-18 DIAGNOSIS — M791 Myalgia, unspecified site: Secondary | ICD-10-CM

## 2018-10-18 DIAGNOSIS — E079 Disorder of thyroid, unspecified: Secondary | ICD-10-CM | POA: Diagnosis not present

## 2018-10-18 DIAGNOSIS — Z0001 Encounter for general adult medical examination with abnormal findings: Secondary | ICD-10-CM | POA: Diagnosis not present

## 2018-10-18 DIAGNOSIS — M545 Low back pain, unspecified: Secondary | ICD-10-CM

## 2018-10-18 DIAGNOSIS — Z9109 Other allergy status, other than to drugs and biological substances: Secondary | ICD-10-CM

## 2018-10-18 DIAGNOSIS — E663 Overweight: Secondary | ICD-10-CM

## 2018-10-18 MED ORDER — DOXYCYCLINE HYCLATE 100 MG PO CAPS: ORAL_CAPSULE | ORAL | 0 refills | Status: DC

## 2018-10-18 MED ORDER — SPIRONOLACTONE 50 MG PO TABS: 50.0000 mg | ORAL_TABLET | Freq: Every day | ORAL | 1 refills | Status: DC

## 2018-10-18 MED FILL — DOXYCYCLINE HYC 100 MG CAPS: 100 | 90 days supply | Qty: 90 | Fill #0

## 2018-10-18 MED FILL — SPIRONOLACTONE 50 MG TABS: 50 | 90 days supply | Qty: 90 | Fill #0

## 2018-10-18 NOTE — Patient Instructions (Addendum)
Alison Alvarez , Thank you for taking time to come for your Annual Wellness Visit. I appreciate your ongoing commitment to your health goals. Please review the following plan we discussed and let me know if I can assist you in the future.   These are the goals we discussed: Goals    . LDL CALC < 100    . Weight (lb) < 145 lb (65.8 kg)       This is a list of the screening recommended for you and due dates:  Health Maintenance  Topic Date Due  . Flu Shot  08/27/2018  . Pap Smear  04/18/2019  . Mammogram  02/03/2020  . Colon Cancer Screening  08/20/2020  . Tetanus Vaccine  07/27/2023  .  Hepatitis C: One time screening is recommended by Center for Disease Control  (CDC) for  adults born from [birth year] through 1965.   Completed  . HIV Screening  Completed   Call your insurance about shingrix vaccine coverage - if they cover it can get at [facility] or [facility] pharmac[facility]    Try stopping rosuvastatin - nexlizet 1 tab daily instead of zetia (has zetia and nexlitol combo) x 1 week - message me if tolerating and will send in script to pharmacy      Know what a healthy weight is for you (roughly BMI <25) and aim to maintain this  Aim for 7+ servings of fruits and vegetables daily  65-80+ fluid ounces of water or unsweet tea for healthy kidneys  Limit to max 1 drink of alcohol per day; avoid smoking/tobacco  Limit animal fats in diet for cholesterol and heart health - choose grass fed whenever available  Avoid highly processed foods, and foods high in saturated/trans fats  Aim for low stress - take time to unwind and care for your mental health  Aim for 150 min of moderate intensity exercise weekly for heart health, and weights twice weekly for bone health  Aim for 7-9 hours of sleep daily     Preventing High Cholesterol Cholesterol is a white, waxy substance similar to fat that the human body needs to help build cells. The liver makes all the cholesterol that a person's body needs.  Having high cholesterol (hypercholesterolemia) increases a person's risk for heart disease and stroke. Extra (excess) cholesterol comes from the food the person eats. High cholesterol can often be prevented with diet and lifestyle changes. If you already have high cholesterol, you can control it with diet and lifestyle changes and with medicine. How can high cholesterol affect me? If you have high cholesterol, deposits (plaques) may build up on the walls of your arteries. The arteries are the blood vessels that carry blood away from your heart. Plaques make the arteries narrower and stiffer. This can limit or block blood flow and cause blood clots to form. Blood clots:  Are tiny balls of cells that form in your blood.  Can move to the heart or brain, causing a heart attack or stroke. Plaques in arteries greatly increase your risk for heart attack and stroke.Making diet and lifestyle changes can reduce your risk for these conditions that may threaten your life. What can increase my risk? This condition is more likely to develop in people who:  Eat foods that are high in saturated fat or cholesterol. Saturated fat is mostly found in: ? Foods that contain animal fat, such as red meat and some dairy products. ? Certain fatty foods made from plants, such as tropical oils.  Are overweight.  Are not getting enough exercise.  Have a family history of high cholesterol. What actions can I take to prevent this? Nutrition   Eat less saturated fat.  Avoid trans fats (partially hydrogenated oils). These are often found in margarine and in some baked goods, fried foods, and snacks bought in packages.  Avoid precooked or cured meat, such as sausages or meat loaves.  Avoid foods and drinks that have added sugars.  Eat more fruits, vegetables, and whole grains.  Choose healthy sources of protein, such as fish, poultry, lean cuts of red meat, beans, peas, lentils, and nuts.  Choose healthy sources  of fat, such as: ? Nuts. ? Vegetable oils, especially olive oil. ? Fish that have healthy fats (omega-3 fatty acids), such as mackerel or salmon. The items listed above may not be a complete list of recommended foods and beverages. Contact a dietitian for more information. Lifestyle  Lose weight if you are overweight. Losing 5-10 lb (2.3-4.5 kg) can help prevent or control high cholesterol. It can also lower your risk for diabetes and high blood pressure. Ask your health care provider to help you with a diet and exercise plan to lose weight safely.  Do not use any products that contain nicotine or tobacco, such as cigarettes, e-cigarettes, and chewing tobacco. If you need help quitting, ask your health care provider.  Limit your alcohol intake. ? Do not drink alcohol if:  Your health care provider tells you not to drink.  You are pregnant, may be pregnant, or are planning to become pregnant. ? If you drink alcohol:  Limit how much you use to:  0-1 drink a day for women.  0-2 drinks a day for men.  Be aware of how much alcohol is in your drink. In the [place] one drink equals one 12 oz bottle of beer (355 mL), one 5 oz glass of wine (148 mL), or one 1 oz glass of hard liquor (44 mL). Activity   Get enough exercise. Each week, do at least 150 minutes of exercise that takes a medium level of effort (moderate-intensity exercise). ? This is exercise that:  Makes your heart beat faster and makes you breathe harder than usual.  Allows you to still be able to talk. ? You could exercise in short sessions several times a day or longer sessions a few times a week. For example, on 5 days each week, you could walk fast or ride your bike 3 times a day for 10 minutes each time.  Do exercises as told by your health care provider. Medicines  In addition to diet and lifestyle changes, your health care provider may recommend medicines to help lower cholesterol. This may be a medicine to lower the  amount of cholesterol your liver makes. You may need medicine if: ? Diet and lifestyle changes do not lower your cholesterol enough. ? You have high cholesterol and other risk factors for heart disease or stroke.  Take over-the-counter and prescription medicines only as told by your health care provider. General information  Manage your risk factors for high cholesterol. Talk with your health care provider about all your risk factors and how to lower your risk.  Manage other conditions that you have, such as diabetes or high blood pressure (hypertension).  Have blood tests to check your cholesterol levels at regular points in time as told by your health care provider.  Keep all follow-up visits as told by your health care provider. This is important. Where  to find more information  American Heart Association: www.heart.org  National Heart, Lung, and Blood Institute: https://wilson-eaton.com/ Summary  High cholesterol increases your risk for heart disease and stroke. By keeping your cholesterol level low, you can reduce your risk for these conditions.  High cholesterol can often be prevented with diet and lifestyle changes.  Work with your health care provider to manage your risk factors, and have your blood tested regularly. This information is not intended to replace advice given to you by your health care provider. Make sure you discuss any questions you have with your health care provider. Document Released: 01/27/2015 Document Revised: 05/06/2018 Document Reviewed: 09/21/2015 Elsevier Patient Education  2020 [employer].

## 2018-10-19 LAB — COMPLETE METABOLIC PANEL WITH GFR
AG Ratio: 2.2 (calc) (ref 1.0–2.5)
ALT: 19 U/L (ref 6–29)
AST: 25 U/L (ref 10–35)
Albumin: 4.8 g/dL (ref 3.6–5.1)
Alkaline phosphatase (APISO): 57 U/L (ref 37–153)
BUN: 14 mg/dL (ref 7–25)
CO2: 26 mmol/L (ref 20–32)
Calcium: 10.2 mg/dL (ref 8.6–10.4)
Chloride: 102 mmol/L (ref 98–110)
Creat: 0.88 mg/dL (ref 0.50–1.05)
GFR, Est African American: 84 mL/min/{1.73_m2} (ref >=60)
GFR, Est Non African American: 72 mL/min/{1.73_m2} (ref >=60)
Globulin: 2.2 g/dL (calc) (ref 1.9–3.7)
Glucose, Bld: 79 mg/dL (ref 65–99)
Potassium: 3.9 mmol/L (ref 3.5–5.3)
Sodium: 140 mmol/L (ref 135–146)
Total Bilirubin: 0.3 mg/dL (ref 0.2–1.2)
Total Protein: 7 g/dL (ref 6.1–8.1)

## 2018-10-19 LAB — URINALYSIS W MICROSCOPIC + REFLEX CULTURE
Bacteria, UA: NONE SEEN /HPF
Bilirubin Urine: NEGATIVE
Glucose, UA: NEGATIVE
Hgb urine dipstick: NEGATIVE
Hyaline Cast: NONE SEEN /LPF
Ketones, ur: NEGATIVE
Leukocyte Esterase: NEGATIVE
Nitrites, Initial: NEGATIVE
Protein, ur: NEGATIVE
RBC / HPF: NONE SEEN /HPF (ref 0–2)
Specific Gravity, Urine: 1.006 (ref 1.001–1.03)
Squamous Epithelial / HPF: NONE SEEN /HPF (ref <=5)
WBC, UA: NONE SEEN /HPF (ref 0–5)
pH: 5.5 (ref 5.0–8.0)

## 2018-10-19 LAB — LIPID PANEL
Cholesterol: 232 mg/dL — ABNORMAL HIGH (ref <200)
HDL: 71 mg/dL (ref >=50)
LDL Cholesterol (Calc): 130 mg/dL (calc) — ABNORMAL HIGH
Non-HDL Cholesterol (Calc): 161 mg/dL (calc) — ABNORMAL HIGH (ref <130)
Total CHOL/HDL Ratio: 3.3 (calc) (ref <5.0)
Triglycerides: 177 mg/dL — ABNORMAL HIGH (ref <150)

## 2018-10-19 LAB — CBC WITH DIFFERENTIAL/PLATELET
Absolute Monocytes: 387 cells/uL (ref 200–950)
Basophils Absolute: 41 cells/uL (ref 0–200)
Basophils Relative: 0.9 %
Eosinophils Absolute: 90 cells/uL (ref 15–500)
Eosinophils Relative: 2 %
HCT: 40.7 % (ref 35.0–45.0)
Hemoglobin: 13.7 g/dL (ref 11.7–15.5)
Lymphs Abs: 1269 cells/uL (ref 850–3900)
MCH: 31.5 pg (ref 27.0–33.0)
MCHC: 33.7 g/dL (ref 32.0–36.0)
MCV: 93.6 fL (ref 80.0–100.0)
MPV: 9.9 fL (ref 7.5–12.5)
Monocytes Relative: 8.6 %
Neutro Abs: 2714 cells/uL (ref 1500–7800)
Neutrophils Relative %: 60.3 %
Platelets: 295 10*3/uL (ref 140–400)
RBC: 4.35 10*6/uL (ref 3.80–5.10)
RDW: 12.7 % (ref 11.0–15.0)
Total Lymphocyte: 28.2 %
WBC: 4.5 10*3/uL (ref 3.8–10.8)

## 2018-10-19 LAB — NO CULTURE INDICATED

## 2018-10-19 LAB — HEMOGLOBIN A1C
Hgb A1c MFr Bld: 5.5 % of total Hgb (ref <5.7)
Mean Plasma Glucose: 111 (calc)
eAG (mmol/L): 6.2 (calc)

## 2018-10-19 LAB — MAGNESIUM: Magnesium: 2 mg/dL (ref 1.5–2.5)

## 2018-10-19 LAB — VITAMIN D 25 HYDROXY (VIT D DEFICIENCY, FRACTURES): Vit D, 25-Hydroxy: 131 ng/mL — ABNORMAL HIGH (ref 30–100)

## 2018-10-19 LAB — TSH: TSH: 0.39 mIU/L — ABNORMAL LOW (ref 0.40–4.50)

## 2018-10-25 DIAGNOSIS — Z6828 Body mass index (BMI) 28.0-28.9, adult: Secondary | ICD-10-CM | POA: Diagnosis not present

## 2018-10-25 DIAGNOSIS — Z01419 Encounter for gynecological examination (general) (routine) without abnormal findings: Secondary | ICD-10-CM | POA: Diagnosis not present

## 2018-10-25 DIAGNOSIS — D259 Leiomyoma of uterus, unspecified: Secondary | ICD-10-CM | POA: Diagnosis not present

## 2018-11-10 ENCOUNTER — Encounter: Payer: Self-pay | Admitting: Internal Medicine

## 2018-11-14 ENCOUNTER — Other Ambulatory Visit: Payer: Self-pay | Admitting: Adult Health

## 2018-11-14 DIAGNOSIS — E782 Mixed hyperlipidemia: Secondary | ICD-10-CM

## 2018-11-14 MED ORDER — NEXLIZET 180-10 MG PO TABS: 1.0000 | ORAL_TABLET | Freq: Every day | ORAL | 1 refills | Status: DC

## 2018-11-17 MED FILL — NEXLIZET 180-10 MG TABS: 180-10 | 90 days supply | Qty: 90 | Fill #0

## 2018-11-28 ENCOUNTER — Other Ambulatory Visit: Payer: Self-pay | Admitting: Internal Medicine

## 2018-11-28 DIAGNOSIS — E039 Hypothyroidism, unspecified: Secondary | ICD-10-CM

## 2018-11-28 MED ORDER — LEVOTHYROXINE SODIUM 75 MCG PO TABS: ORAL_TABLET | ORAL | 1 refills | Status: DC

## 2018-11-28 MED FILL — VIT D2 1.25 MG (50,000 UNIT: 1.25 MG | 84 days supply | Qty: 24 | Fill #1

## 2018-11-28 MED FILL — LEVOTHYROXINE 75 MCG TABLET: 75 | 90 days supply | Qty: 90 | Fill #0

## 2018-11-28 MED FILL — OMEPRAZOLE 20 MG CAP: 20 | 90 days supply | Qty: 90 | Fill #1

## 2018-12-26 ENCOUNTER — Other Ambulatory Visit: Payer: Self-pay | Admitting: Internal Medicine

## 2018-12-26 MED ORDER — FLUOXETINE HCL 20 MG PO CAPS: ORAL_CAPSULE | ORAL | 1 refills | Status: DC

## 2018-12-26 MED FILL — FLUoxetine HCL 20 MG CAPS: 20 | 90 days supply | Qty: 180 | Fill #0

## 2019-01-13 ENCOUNTER — Other Ambulatory Visit: Payer: Self-pay | Admitting: Adult Health

## 2019-01-13 DIAGNOSIS — L709 Acne, unspecified: Secondary | ICD-10-CM

## 2019-01-13 MED FILL — DOXYCYCLINE HYC 100 MG CAPS: 100 | 90 days supply | Qty: 90 | Fill #0

## 2019-01-21 ENCOUNTER — Other Ambulatory Visit: Payer: Self-pay

## 2019-01-21 ENCOUNTER — Encounter (HOSPITAL_COMMUNITY): Payer: Self-pay | Admitting: Emergency Medicine

## 2019-01-21 ENCOUNTER — Emergency Department (HOSPITAL_COMMUNITY)
Admission: EM | Admit: 2019-01-21 | Discharge: 2019-01-22 | Disposition: A | Discharge status: Home / Self Care | Payer: 59 | Attending: Emergency Medicine | Admitting: Emergency Medicine

## 2019-01-21 ENCOUNTER — Emergency Department (HOSPITAL_BASED_OUTPATIENT_CLINIC_OR_DEPARTMENT_OTHER): Payer: 59

## 2019-01-21 DIAGNOSIS — R600 Localized edema: Secondary | ICD-10-CM

## 2019-01-21 DIAGNOSIS — Z79899 Other long term (current) drug therapy: Secondary | ICD-10-CM | POA: Insufficient documentation

## 2019-01-21 DIAGNOSIS — E039 Hypothyroidism, unspecified: Secondary | ICD-10-CM | POA: Insufficient documentation

## 2019-01-21 DIAGNOSIS — M79609 Pain in unspecified limb: Secondary | ICD-10-CM

## 2019-01-21 DIAGNOSIS — R2243 Localized swelling, mass and lump, lower limb, bilateral: Secondary | ICD-10-CM | POA: Insufficient documentation

## 2019-01-21 NOTE — ED Triage Notes (Addendum)
C/o L calf pain and swelling since Wednesday.  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  Dr. Regenia Skeeter notified of pt and vascular US ordered.  NS notified to call vascular US tech.

## 2019-01-21 NOTE — ED Notes (Signed)
[facility] AT 6:19

## 2019-01-21 NOTE — Progress Notes (Signed)
VASCULAR LAB PRELIMINARY  PRELIMINARY  PRELIMINARY  PRELIMINARY  Left lower extremity venous duplex completed.    Preliminary report:  See CV proc for preliminary results.   Gave Dr. Regenia Skeeter results.   Daric Koren, RVT 01/21/2019, 7:35 PM

## 2019-01-22 DIAGNOSIS — R600 Localized edema: Secondary | ICD-10-CM | POA: Diagnosis not present

## 2019-01-22 NOTE — Discharge Instructions (Addendum)
Elevate your feet as much as possible for the next several days.  Follow-up with your primary doctor on Wednesday as scheduled, and return to the ER if you develop worsening swelling, difficulty breathing, chest pain, or other new and concerning symptoms.

## 2019-01-22 NOTE — ED Notes (Signed)
ED Provider at bedside. 

## 2019-01-22 NOTE — ED Provider Notes (Signed)
[place] EMERGENCY DEPARTMENT Provider Note   CSN: [id number] Arrival date & time: 01/21/19  1716     History Chief Complaint  Patient presents with  . Leg Pain    Alison Alvarez is a 58 y.o. female.  Patient is a 58 year old female with past medical history of hyperlipidemia, depression, arthritis.  She presents today for evaluation of bilateral calf pain and swelling in her ankles.  This started several days ago in the absence of any injury or trauma.  She does state that she has been on her feet more due to holiday preparation and also at her job as a Marine scientist.  She denies any chest pain or difficulty breathing.  The history is provided by the patient.  Leg Pain Lower extremity pain location: calves. Pain details:    Quality:  Aching   Radiates to:  Does not radiate   Severity:  Mild   Onset quality:  Gradual   Duration:  3 days   Timing:  Constant   Progression:  Worsening Chronicity:  New      Past Medical History:  Diagnosis Date  . Acne   . Anxiety   . Arthritis   . Cervical polyp   . Chronic back pain   . Depression   . Endometriosis   . GERD (gastroesophageal reflux disease)   . Hyperlipidemia   . Hypothyroidism, adult    hypothyroidism  . PCO (polycystic ovaries)   . Plantar fasciitis   . Pleural effusion, bilateral   . Post-menopausal   . Pre-diabetes     Patient Active Problem List   Diagnosis Date Noted  . Fatty liver 06/14/2018  . Myalgia 06/13/2018  . GERD (gastroesophageal reflux disease) 06/09/2018  . Overweight (BMI 25.0-29.9) 06/09/2018  . Environmental allergies 08/07/2014  . Vitamin D deficiency 01/15/2013  . Anxiety   . Hyperlipidemia   . Thyroid disease   . History of prediabetes   . Chronic back pain   . Acne     Past Surgical History:  Procedure Laterality Date  . BRAIN MENINGIOMA EXCISION  1965   at age 18  . BREAST EXCISIONAL BIOPSY Left   . DIAGNOSTIC LAPAROSCOPY    . SYNOVIAL CYST EXCISION   2010   l5, s1 left hip  . TONSILLECTOMY    . wisom teeth  1984     OB History   No obstetric history on file.     Family History  Problem Relation Age of Onset  . Dementia Mother   . Osteoporosis Mother   . Heart disease Father   . Parkinson's disease Father   . Stroke Father   . Diabetes Father   . Hyperlipidemia Father   . Hypertension Father   . Cancer Paternal Aunt        breast  . Breast cancer Paternal Aunt   . Cancer Maternal Grandfather        brain  . Endometrial cancer Sister   . Hypertension Brother   . Hyperlipidemia Brother     Social History   Tobacco Use  . Smoking status: Never Smoker  . Smokeless tobacco: Never Used  Substance Use Topics  . Alcohol use: Yes    Comment: occasional alcohol intake  . Drug use: No    Home Medications Prior to Admission medications   Medication Sig Start Date End Date Taking? Authorizing Provider  acetaminophen (TYLENOL) 500 MG tablet Take 1 tablet (500 mg total) by mouth every 6 (six) hours as  needed for mild pain, fever or headache. 12/17/17   Lavina Hamman, MD  albuterol (VENTOLIN HFA) 108 (90 Base) MCG/ACT inhaler Inhale 2 puffs into the lungs every 6 (six) hours as needed for wheezing or shortness of breath. 06/13/18   Liane Comber, NP  ALPRAZolam Duanne Moron) 0.25 MG tablet Take 1 tablet (0.25 mg total) by mouth 3 (three) times daily. 09/22/17   Vicie Mutters, PA-C  Bempedoic Acid-Ezetimibe (NEXLIZET) 180-10 MG TABS Take 1 tablet by mouth daily. 11/14/18   Liane Comber, NP  Bioflavonoid Products (PERIDRIN-C) 200-50-150 MG TABS Take 2 tablets by mouth daily.     [provider]  cetirizine (ZYRTEC ALLERGY) 10 MG tablet Take 10 mg by mouth daily.    [provider]  doxycycline (VIBRAMYCIN) 100 MG capsule TAKE 1 CAPSULE BY MOUTH DAILY WITH FOOD FOR ACNE 01/13/19   Liane Comber, NP  FLUoxetine (PROZAC) 20 MG capsule Take 1 capsule daily for Mood 12/26/18   Unk Pinto, MD  levothyroxine  (SYNTHROID) 75 MCG tablet Take 1 tablet daily on an empty stomach with only water for 30 minutes & no Antacid meds, Calcium or Magnesium for 4 hours & avoid Biotin 11/28/18   Unk Pinto, MD  omeprazole (PRILOSEC) 20 MG capsule Take 1 capsule Daily for Heartburn & Reflux 08/25/18   Unk Pinto, MD  rosuvastatin (CRESTOR) 5 MG tablet Take 1 tab in the evening once weekly; then as tolerated, slowly increase up to 3 days a week. Stop and back down on dose with myalgias. 06/14/18   Liane Comber, NP  spironolactone (ALDACTONE) 50 MG tablet Take 1 tablet (50 mg total) by mouth daily. 10/18/18 10/18/19  Liane Comber, NP  Vitamin D, Ergocalciferol, (DRISDOL) 1.25 MG (50000 UT) CAPS capsule TAKE 1 CAPSULE BY MOUTH TWICE A WEEK 08/06/18   Unk Pinto, MD    Allergies    Advicor [niacin-lovastatin er] and Niacin-lovastatin er  Review of Systems   Review of Systems  All other systems reviewed and are negative.   Physical Exam Updated Vital Signs BP 114/65 (BP Location: Right Arm)   Pulse 66   Temp 98.6 F (37 C) (Oral)   Resp 18   SpO2 100%   Physical Exam Vitals and nursing note reviewed.  Constitutional:      General: She is not in acute distress.    Appearance: Normal appearance. She is not ill-appearing.  HENT:     Head: Normocephalic and atraumatic.  Pulmonary:     Effort: Pulmonary effort is normal.  Musculoskeletal:     Comments: There is mild swelling of both ankles.  The calves appears grossly normal.  There is no erythema.  Bevelyn Buckles' sign is absent bilaterally.  DP pulses are easily palpable.  Skin:    General: Skin is warm and dry.  Neurological:     General: No focal deficit present.     Mental Status: She is alert and oriented to person, place, and time.     ED Results / Procedures / Treatments   Labs (all labs ordered are listed, but only abnormal results are displayed) Labs Reviewed - No data to display  EKG None  Radiology VAS [place] LOWER EXTREMITY  VENOUS (DVT) ([facility] and [facility] 7a-7p)  Result Date: 01/21/2019  Lower Venous Study Indications: Pain.  Comparison Study: No prior study on file for comparison. Performing Technologist: Sharion Dove RVS  Examination Guidelines: A complete evaluation includes B-mode imaging, spectral Doppler, color Doppler, and power Doppler as needed of all accessible portions  of each vessel. Bilateral testing is considered an integral part of a complete examination. Limited examinations for reoccurring indications may be performed as noted.  +-----+---------------+---------+-----------+----------+--------------+ RIGHTCompressibilityPhasicitySpontaneityPropertiesThrombus Aging +-----+---------------+---------+-----------+----------+--------------+ CFV  Full           Yes      Yes                                 +-----+---------------+---------+-----------+----------+--------------+   +---------+---------------+---------+-----------+----------+--------------+ LEFT     CompressibilityPhasicitySpontaneityPropertiesThrombus Aging +---------+---------------+---------+-----------+----------+--------------+ CFV      Full           Yes      Yes                                 +---------+---------------+---------+-----------+----------+--------------+ SFJ      Full                                                        +---------+---------------+---------+-----------+----------+--------------+ FV Prox  Full                                                        +---------+---------------+---------+-----------+----------+--------------+ FV Mid   Full                                                        +---------+---------------+---------+-----------+----------+--------------+ FV DistalFull                                                        +---------+---------------+---------+-----------+----------+--------------+ PFV      Full                                                         +---------+---------------+---------+-----------+----------+--------------+ POP      Full           Yes      Yes                                 +---------+---------------+---------+-----------+----------+--------------+ PTV      Full                                                        +---------+---------------+---------+-----------+----------+--------------+ PERO     Full                                                        +---------+---------------+---------+-----------+----------+--------------+  Summary: Right: No evidence of common femoral vein obstruction. Left: There is no evidence of deep vein thrombosis in the lower extremity.  *See table(s) above for measurements and observations.    Preliminary     Procedures Procedures (including critical care time)  Medications Ordered in ED Medications - No data to display  ED Course  I have reviewed the triage vital signs and the nursing notes.  Pertinent labs & imaging results that were available during my care of the patient were reviewed by me and considered in my medical decision making (see chart for details).    MDM Rules/Calculators/A&P  Patient presenting with complaints of swelling in her legs and calf discomfort for the past several days.  Her ultrasounds are negative.  I suspect this is related to a dependent edema as she has been on her feet more due to the holidays and work.  Patient will be advised to elevate, rest, and follow-up as needed.  She tells me she has an appointment on Wednesday to see her primary doctor for routine reasons.    Final Clinical Impression(s) / ED Diagnoses Final diagnoses:  None    Rx / DC Orders ED Discharge Orders    None       Veryl Speak, MD 01/22/19 [phone]

## 2019-01-23 ENCOUNTER — Ambulatory Visit: Payer: 59 | Admitting: Adult Health

## 2019-01-23 NOTE — Progress Notes (Signed)
Assessment and Plan:   Mixed hyperlipidemia Newly on nexlizet; myalgias resolved off of statin With + family history of cardiac disease;  -     COMPLETE METABOLIC PANEL WITH GFR -     Lipid panel  History of Pre-diabetes Discussed disease and risks Discussed diet/exercise, weight management  A1C at goal last visit; check CMP; defer A1C to CPE  Hypothyroidism continue medications the same pending lab results reminded to take on an empty stomach 30-91mins before food.  check TSH level  Vitamin D deficiency Above goal at recent check; did reduce from 50000 twice weekly to once a week continue to recommend supplementation for goal of 60-100 Defer vitamin D level to CPE due to cost of lab;   Anxiety Well managed by current regimen; continue medications; rare use of benzo  Stress management techniques discussed, increase water, good sleep hygiene discussed, increase exercise, and increase veggies.   GERD Well managed on current medications; on PPI due to hiatal hernia, failed taper to H2i Discussed diet, avoiding triggers and other lifestyle changes  Medication management -     CBC with Differential/Platelet -     COMPLETE METABOLIC PANEL WITH GFR  -     Magnesium   Acne/folliculitis She is on spironolactone; doxycycline 100 mg to take daily, plan for this to be short term until covid 19 resolves and she can resume electrolysis  Much improved; will trial break off of doxycycline   Further disposition pending results of labs. Discussed med's effects and SE's.   Over 30 minutes of exam, counseling, chart review, and critical decision making was performed.   Future Appointments  Date Time Provider [place]  10/18/2019  3:00 PM Liane Comber, NP [facility] None   ------------------------------------------------------------------------------------------------------------------   HPI 58 y.o.female presents for 6 month follow up for hyperlipidemia, hypothyroid,  glucose management (hx of prediabetes), vitamin D deficiency and anxiety.   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 Does wear compression hose at work.    She has cystic acne and folliculitis secondary to PCOS/hirstutism, typically gets electrolysis which improves significantly but has been unable to get this done with pandemic.  She is now on doxycycline and spironolactone with good results.   she has a diagnosis of anxiety and is currently on xanax 0.25 mg PRN, reports symptoms are well controlled on current regimen. she reports she uses rarely, typically when flying.   she has a diagnosis of GERD which is currently managed by omeprazole 20 mg due to GERD with hiatal hernia, has failed attempt to taper off of PPI.  she reports symptoms is currently well controlled, and denies breakthrough reflux, burning in chest, hoarseness or cough.     She is on cholesterol medication, newly trying nexlizet and tolerating well, reports feeling so much better off of statin, did have myalgias with statin, did have myalgias with low dose low frequency rosuvastatin. Was on vytorin and fenofibrate for many years but noted significant improvement in aches/fatigue when she was off meds in hospital.  Her cholesterol is not at goal. The cholesterol last visit was:   Lab Results  Component Value Date   CHOL 232 (H) 10/18/2018   HDL 71 10/18/2018   LDLCALC 130 (H) 10/18/2018   TRIG 177 (H) 10/18/2018  CHOLHDL 3.3 10/18/2018   Her blood pressure has been controlled at home, today their BP is BP: 104/60  She does workout. She denies chest pain, shortness of breath, dizziness.   She has been working on diet and exercise for history of prediabetes recently normalized A1Cs (intermittently A1Cs  up to 5.9% predating 2014), and denies increased appetite, nausea, paresthesia of the feet, polydipsia, polyuria and visual disturbances. Last A1C in the office was:  Lab Results  Component Value Date   HGBA1C 5.5 10/18/2018   She is on thyroid medication. Her medication was not changed last visit.  Taking 75 mcg daily; takes on empty stomach 30-60 min with water.  Lab Results  Component Value Date   TSH 0.39 (L) 10/18/2018   Patient is on Vitamin D supplement, has reduced from twice or once weekly:  Lab Results  Component Value Date   VD25OH 131 (H) 10/18/2018       Past Medical History:  Diagnosis Date  . Acne   . Anxiety   . Arthritis   . Cervical polyp   . Chronic back pain   . Depression   . Endometriosis   . GERD (gastroesophageal reflux disease)   . Hyperlipidemia   . Hypothyroidism, adult    hypothyroidism  . PCO (polycystic ovaries)   . Plantar fasciitis   . Pleural effusion, bilateral   . Post-menopausal   . Pre-diabetes      Allergies  Allergen Reactions  . Advicor [Niacin-Lovastatin Er] Swelling    Facial swelling  . Niacin-Lovastatin Er Swelling and Other (See Comments)    Extreme flushing    Current Outpatient Medications on File Prior to Visit  Medication Sig  . acetaminophen (TYLENOL) 500 MG tablet Take 1 tablet (500 mg total) by mouth every 6 (six) hours as needed for mild pain, fever or headache.  . albuterol (VENTOLIN HFA) 108 (90 Base) MCG/ACT inhaler Inhale 2 puffs into the lungs every 6 (six) hours as needed for wheezing or shortness of breath.  . ALPRAZolam (XANAX) 0.25 MG tablet Take 1 tablet (0.25 mg total) by mouth 3 (three) times daily.  . Bempedoic Acid-Ezetimibe (NEXLIZET) 180-10 MG TABS Take 1 tablet by mouth daily.  [name] Products (PERIDRIN-C) 200-50-150 MG TABS Take 2 tablets by mouth daily.   . cetirizine (ZYRTEC ALLERGY) 10 MG tablet Take 10 mg by mouth daily.  Marland Kitchen doxycycline (VIBRAMYCIN) 100 MG capsule TAKE 1 CAPSULE BY  MOUTH DAILY WITH FOOD FOR ACNE  . FLUoxetine (PROZAC) 20 MG capsule Take 1 capsule daily for Mood  . levothyroxine (SYNTHROID) 75 MCG tablet Take 1 tablet daily on an empty stomach with only water for 30 minutes & no Antacid meds, Calcium or Magnesium for 4 hours & avoid Biotin  . omeprazole (PRILOSEC) 20 MG capsule Take 1 capsule Daily for Heartburn & Reflux  . spironolactone (ALDACTONE) 50 MG tablet Take 1 tablet (50 mg total) by mouth daily.  . Vitamin D, Ergocalciferol, (DRISDOL) 1.25 MG (50000 UT) CAPS capsule TAKE 1 CAPSULE BY MOUTH TWICE A WEEK (Patient taking differently: Take one capsule once a week)   No current facility-administered medications on file prior to visit.    ROS: Review of Systems  Constitutional: Negative for malaise/fatigue and weight loss.  HENT: Negative for hearing loss and tinnitus.   Eyes: Negative for blurred vision and double vision.  Respiratory: Negative for cough, shortness of breath and wheezing.   Cardiovascular: Negative for chest pain, palpitations, orthopnea, claudication, leg swelling and PND.  Gastrointestinal: Negative for abdominal pain, blood in stool, constipation, diarrhea, heartburn, melena, nausea and vomiting.  Genitourinary: Negative.   Musculoskeletal: Negative for joint pain and myalgias.  Skin: Negative for rash.  Neurological: Negative for dizziness, tingling, sensory change, weakness and headaches.  Endo/Heme/Allergies: Negative for polydipsia.  Psychiatric/Behavioral: Negative for depression, substance abuse and suicidal ideas. The patient is not nervous/anxious and does not have insomnia.   All other systems reviewed and are negative.    Physical Exam:  BP 104/60   Pulse 72   Temp (!) 97.2 F (36.2 C)   Wt 157 lb (71.2 kg)   SpO2 97%   BMI 29.18 kg/m   General Appearance: Well nourished, in no apparent distress. Eyes: PERRLA, EOMs, conjunctiva no swelling or erythema Sinuses: No Frontal/maxillary tenderness ENT/Mouth:  Ext aud canals clear, TMs without erythema, bulging. Mask in place; oral exam deferred. Hearing normal.  Neck: Supple, thyroid normal.  Respiratory: Respiratory effort normal, BS equal bilaterally without rales, rhonchi, wheezing or stridor.  Cardio: RRR with no MRGs. Brisk peripheral pulses without edema.  Abdomen: Soft, + BS.  Non tender, no guarding, rebound, hernias, masses. Lymphatics: Non tender without lymphadenopathy.  Musculoskeletal: Full ROM, 5/5 strength, normal gait. Mild fullness of left calf without tenderness.  Skin: Warm, dry without rashes, ecchymosis. Cystic lesions to chin resolved.  Neuro: Cranial nerves intact. Normal muscle tone, no cerebellar symptoms. Sensation intact.  Psych: Awake and oriented X 3, normal affect, Insight and Judgment appropriate.     Izora Ribas, NP 5:05 PM [facility]

## 2019-01-24 ENCOUNTER — Encounter: Payer: Self-pay | Admitting: Adult Health

## 2019-01-24 ENCOUNTER — Other Ambulatory Visit: Payer: Self-pay

## 2019-01-24 ENCOUNTER — Ambulatory Visit: Payer: 59 | Admitting: Adult Health

## 2019-01-24 VITALS — BP 104/60 | HR 72 | Temp 97.2°F | Wt 157.0 lb

## 2019-01-24 DIAGNOSIS — Z87898 Personal history of other specified conditions: Secondary | ICD-10-CM | POA: Diagnosis not present

## 2019-01-24 DIAGNOSIS — K219 Gastro-esophageal reflux disease without esophagitis: Secondary | ICD-10-CM

## 2019-01-24 DIAGNOSIS — F419 Anxiety disorder, unspecified: Secondary | ICD-10-CM

## 2019-01-24 DIAGNOSIS — E782 Mixed hyperlipidemia: Secondary | ICD-10-CM

## 2019-01-24 DIAGNOSIS — K760 Fatty (change of) liver, not elsewhere classified: Secondary | ICD-10-CM

## 2019-01-24 DIAGNOSIS — E079 Disorder of thyroid, unspecified: Secondary | ICD-10-CM | POA: Diagnosis not present

## 2019-01-24 DIAGNOSIS — E663 Overweight: Secondary | ICD-10-CM | POA: Diagnosis not present

## 2019-01-24 DIAGNOSIS — Z79899 Other long term (current) drug therapy: Secondary | ICD-10-CM | POA: Diagnosis not present

## 2019-01-24 DIAGNOSIS — E559 Vitamin D deficiency, unspecified: Secondary | ICD-10-CM

## 2019-01-24 NOTE — Patient Instructions (Addendum)
Goals    . LDL CALC < 100    . Weight (lb) < 145 lb (65.8 kg)       Wear compression hose daily Elevate feet whenever possible Get up and walk 5 min every 1-2 hours if possible   Stretch and massage calves in the evening; magnesium creams can be beneficial    Preventing High Cholesterol Cholesterol is a white, waxy substance similar to fat that the human body needs to help build cells. The liver makes all the cholesterol that a person's body needs. Having high cholesterol (hypercholesterolemia) increases a person's risk for heart disease and stroke. Extra (excess) cholesterol comes from the food the person eats. High cholesterol can often be prevented with diet and lifestyle changes. If you already have high cholesterol, you can control it with diet and lifestyle changes and with medicine. How can high cholesterol affect me? If you have high cholesterol, deposits (plaques) may build up on the walls of your arteries. The arteries are the blood vessels that carry blood away from your heart. Plaques make the arteries narrower and stiffer. This can limit or block blood flow and cause blood clots to form. Blood clots:  Are tiny balls of cells that form in your blood.  Can move to the heart or brain, causing a heart attack or stroke. Plaques in arteries greatly increase your risk for heart attack and stroke.Making diet and lifestyle changes can reduce your risk for these conditions that may threaten your life. What can increase my risk? This condition is more likely to develop in people who:  Eat foods that are high in saturated fat or cholesterol. Saturated fat is mostly found in: ? Foods that contain animal fat, such as red meat and some dairy products. ? Certain fatty foods made from plants, such as tropical oils.  Are overweight.  Are not getting enough exercise.  Have a family history of high cholesterol. What actions can I take to prevent this? Nutrition   Eat less saturated  fat.  Avoid trans fats (partially hydrogenated oils). These are often found in margarine and in some baked goods, fried foods, and snacks bought in packages.  Avoid precooked or cured meat, such as sausages or meat loaves.  Avoid foods and drinks that have added sugars.  Eat more fruits, vegetables, and whole grains.  Choose healthy sources of protein, such as fish, poultry, lean cuts of red meat, beans, peas, lentils, and nuts.  Choose healthy sources of fat, such as: ? Nuts. ? Vegetable oils, especially olive oil. ? Fish that have healthy fats (omega-3 fatty acids), such as mackerel or salmon. The items listed above may not be a complete list of recommended foods and beverages. Contact a dietitian for more information. Lifestyle  Lose weight if you are overweight. Losing 5-10 lb (2.3-4.5 kg) can help prevent or control high cholesterol. It can also lower your risk for diabetes and high blood pressure. Ask your health care provider to help you with a diet and exercise plan to lose weight safely.  Do not use any products that contain nicotine or tobacco, such as cigarettes, e-cigarettes, and chewing tobacco. If you need help quitting, ask your health care provider.  Limit your alcohol intake. ? Do not drink alcohol if:  Your health care provider tells you not to drink.  You are pregnant, may be pregnant, or are planning to become pregnant. ? If you drink alcohol:  Limit how much you use to:  0-1 drink a day  for women.  0-2 drinks a day for men.  Be aware of how much alcohol is in your drink. In the [place] one drink equals one 12 oz bottle of beer (355 mL), one 5 oz glass of wine (148 mL), or one 1 oz glass of hard liquor (44 mL). Activity   Get enough exercise. Each week, do at least 150 minutes of exercise that takes a medium level of effort (moderate-intensity exercise). ? This is exercise that:  Makes your heart beat faster and makes you breathe harder than  usual.  Allows you to still be able to talk. ? You could exercise in short sessions several times a day or longer sessions a few times a week. For example, on 5 days each week, you could walk fast or ride your bike 3 times a day for 10 minutes each time.  Do exercises as told by your health care provider. Medicines  In addition to diet and lifestyle changes, your health care provider may recommend medicines to help lower cholesterol. This may be a medicine to lower the amount of cholesterol your liver makes. You may need medicine if: ? Diet and lifestyle changes do not lower your cholesterol enough. ? You have high cholesterol and other risk factors for heart disease or stroke.  Take over-the-counter and prescription medicines only as told by your health care provider. General information  Manage your risk factors for high cholesterol. Talk with your health care provider about all your risk factors and how to lower your risk.  Manage other conditions that you have, such as diabetes or high blood pressure (hypertension).  Have blood tests to check your cholesterol levels at regular points in time as told by your health care provider.  Keep all follow-up visits as told by your health care provider. This is important. Where to find more information  American Heart Association: www.heart.org  National Heart, Lung, and Blood Institute: https://wilson-eaton.com/ Summary  High cholesterol increases your risk for heart disease and stroke. By keeping your cholesterol level low, you can reduce your risk for these conditions.  High cholesterol can often be prevented with diet and lifestyle changes.  Work with your health care provider to manage your risk factors, and have your blood tested regularly. This information is not intended to replace advice given to you by your health care provider. Make sure you discuss any questions you have with your health care provider. Document Released: 01/27/2015  Document Revised: 05/06/2018 Document Reviewed: 09/21/2015 Elsevier Patient Education  2020 [employer].

## 2019-01-25 LAB — CBC WITH DIFFERENTIAL/PLATELET
Absolute Monocytes: 331 cells/uL (ref 200–950)
Basophils Absolute: 18 cells/uL (ref 0–200)
Basophils Relative: 0.4 %
Eosinophils Absolute: 110 cells/uL (ref 15–500)
Eosinophils Relative: 2.4 %
HCT: 39.2 % (ref 35.0–45.0)
Hemoglobin: 13.4 g/dL (ref 11.7–15.5)
Lymphs Abs: 1362 cells/uL (ref 850–3900)
MCH: 32.4 pg (ref 27.0–33.0)
MCHC: 34.2 g/dL (ref 32.0–36.0)
MCV: 94.7 fL (ref 80.0–100.0)
MPV: 9.5 fL (ref 7.5–12.5)
Monocytes Relative: 7.2 %
Neutro Abs: 2778 cells/uL (ref 1500–7800)
Neutrophils Relative %: 60.4 %
Platelets: 373 10*3/uL (ref 140–400)
RBC: 4.14 10*6/uL (ref 3.80–5.10)
RDW: 12.3 % (ref 11.0–15.0)
Total Lymphocyte: 29.6 %
WBC: 4.6 10*3/uL (ref 3.8–10.8)

## 2019-01-25 LAB — COMPLETE METABOLIC PANEL WITH GFR
AG Ratio: 2.1 (calc) (ref 1.0–2.5)
ALT: 29 U/L (ref 6–29)
AST: 30 U/L (ref 10–35)
Albumin: 4.7 g/dL (ref 3.6–5.1)
Alkaline phosphatase (APISO): 48 U/L (ref 37–153)
BUN: 10 mg/dL (ref 7–25)
CO2: 28 mmol/L (ref 20–32)
Calcium: 10.3 mg/dL (ref 8.6–10.4)
Chloride: 101 mmol/L (ref 98–110)
Creat: 0.82 mg/dL (ref 0.50–1.05)
GFR, Est African American: 91 mL/min/{1.73_m2} (ref >=60)
GFR, Est Non African American: 79 mL/min/{1.73_m2} (ref >=60)
Globulin: 2.2 g/dL (calc) (ref 1.9–3.7)
Glucose, Bld: 111 mg/dL — ABNORMAL HIGH (ref 65–99)
Potassium: 4.1 mmol/L (ref 3.5–5.3)
Sodium: 139 mmol/L (ref 135–146)
Total Bilirubin: 0.4 mg/dL (ref 0.2–1.2)
Total Protein: 6.9 g/dL (ref 6.1–8.1)

## 2019-01-25 LAB — LIPID PANEL
Cholesterol: 213 mg/dL — ABNORMAL HIGH (ref <200)
HDL: 60 mg/dL (ref >=50)
LDL Cholesterol (Calc): 106 mg/dL (calc) — ABNORMAL HIGH
Non-HDL Cholesterol (Calc): 153 mg/dL (calc) — ABNORMAL HIGH (ref <130)
Total CHOL/HDL Ratio: 3.6 (calc) (ref <5.0)
Triglycerides: 349 mg/dL — ABNORMAL HIGH (ref <150)

## 2019-01-25 LAB — MAGNESIUM: Magnesium: 2 mg/dL (ref 1.5–2.5)

## 2019-01-25 LAB — TSH: TSH: 0.77 mIU/L (ref 0.40–4.50)

## 2019-02-20 MED FILL — NEXLIZET 180-10 MG TABS: 180-10 | 90 days supply | Qty: 90 | Fill #1

## 2019-03-06 ENCOUNTER — Other Ambulatory Visit: Payer: Self-pay | Admitting: Physician Assistant

## 2019-03-06 MED FILL — LINZESS 145 MCG CAPSULE: 145 | 90 days supply | Qty: 90 | Fill #0

## 2019-03-06 MED FILL — OMEPRAZOLE 20 MG CAP: 20 | 90 days supply | Qty: 90 | Fill #2

## 2019-03-06 MED FILL — LEVOTHYROXINE 75 MCG TABLET: 75 | 90 days supply | Qty: 90 | Fill #1

## 2019-04-03 ENCOUNTER — Other Ambulatory Visit: Payer: Self-pay | Admitting: Physician Assistant

## 2019-04-04 MED FILL — MOMETASONE FUROATE 50 MCG S: 50 | 30 days supply | Qty: 17 | Fill #0

## 2019-05-03 ENCOUNTER — Other Ambulatory Visit: Payer: Self-pay | Admitting: Adult Health

## 2019-05-03 DIAGNOSIS — L709 Acne, unspecified: Secondary | ICD-10-CM

## 2019-05-03 MED FILL — DOXYCYCLINE HYC 100 MG CAPS: 100 | 90 days supply | Qty: 90 | Fill #0

## 2019-05-03 MED FILL — FLUoxetine HCL 20 MG CAPS: 20 | 90 days supply | Qty: 180 | Fill #1

## 2019-05-08 ENCOUNTER — Other Ambulatory Visit: Payer: Self-pay | Admitting: Internal Medicine

## 2019-05-08 DIAGNOSIS — Z1231 Encounter for screening mammogram for malignant neoplasm of breast: Secondary | ICD-10-CM

## 2019-05-16 ENCOUNTER — Other Ambulatory Visit: Payer: Self-pay

## 2019-05-16 ENCOUNTER — Ambulatory Visit
Admission: RE | Admit: 2019-05-16 | Discharge: 2019-05-16 | Disposition: A | Discharge status: Home / Self Care | Payer: 59 | Source: Ambulatory Visit | Attending: Internal Medicine | Admitting: Internal Medicine

## 2019-05-16 DIAGNOSIS — Z1231 Encounter for screening mammogram for malignant neoplasm of breast: Secondary | ICD-10-CM

## 2019-05-30 ENCOUNTER — Other Ambulatory Visit: Payer: Self-pay | Admitting: Internal Medicine

## 2019-05-30 ENCOUNTER — Other Ambulatory Visit: Payer: Self-pay | Admitting: Adult Health

## 2019-05-30 DIAGNOSIS — K219 Gastro-esophageal reflux disease without esophagitis: Secondary | ICD-10-CM

## 2019-05-30 DIAGNOSIS — E782 Mixed hyperlipidemia: Secondary | ICD-10-CM

## 2019-05-30 DIAGNOSIS — E039 Hypothyroidism, unspecified: Secondary | ICD-10-CM

## 2019-05-30 MED ORDER — VITAMIN D (ERGOCALCIFEROL) 1.25 MG (50000 UNIT) PO CAPS: ORAL_CAPSULE | ORAL | 0 refills | Status: DC

## 2019-05-30 MED ORDER — LEVOTHYROXINE SODIUM 75 MCG PO TABS: ORAL_TABLET | ORAL | 1 refills | Status: DC

## 2019-05-30 MED ORDER — OMEPRAZOLE 20 MG PO CPDR: DELAYED_RELEASE_CAPSULE | ORAL | 0 refills | Status: DC

## 2019-05-30 MED FILL — OMEPRAZOLE 20 MG CAP: 20 | 90 days supply | Qty: 90 | Fill #0

## 2019-05-30 MED FILL — LEVOTHYROXINE 75 MCG TABLET: 75 | 90 days supply | Qty: 90 | Fill #0

## 2019-05-30 MED FILL — NEXLIZET 180-10 MG TABS: 180-10 | 90 days supply | Qty: 90 | Fill #0

## 2019-05-30 MED FILL — VIT D2 1.25 MG (50,000 UNIT: 1.25 MG | 84 days supply | Qty: 12 | Fill #0

## 2019-08-06 IMAGING — US US ABDOMEN LIMITED
1 series · 14 of 25 positions shown · non-contrast
Comparison: None.

CLINICAL DATA: LFT elevation

EXAM:
ULTRASOUND ABDOMEN LIMITED RIGHT UPPER QUADRANT

[Series 1: us abdomen limited · 0.24mm/px · 14 of 41 slices shown]
[im 1/41]
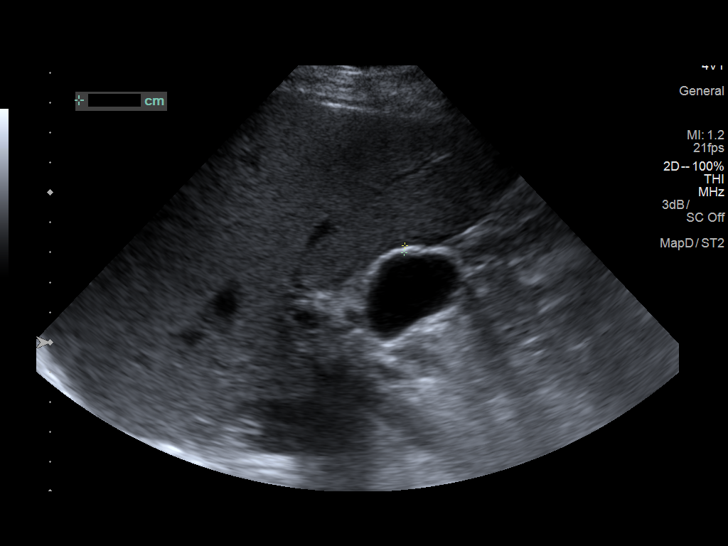
[im 4/41]
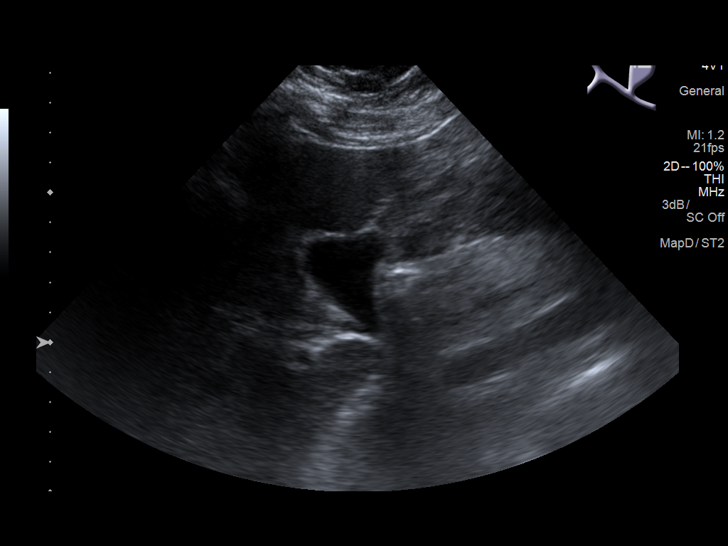
[im 7/41]
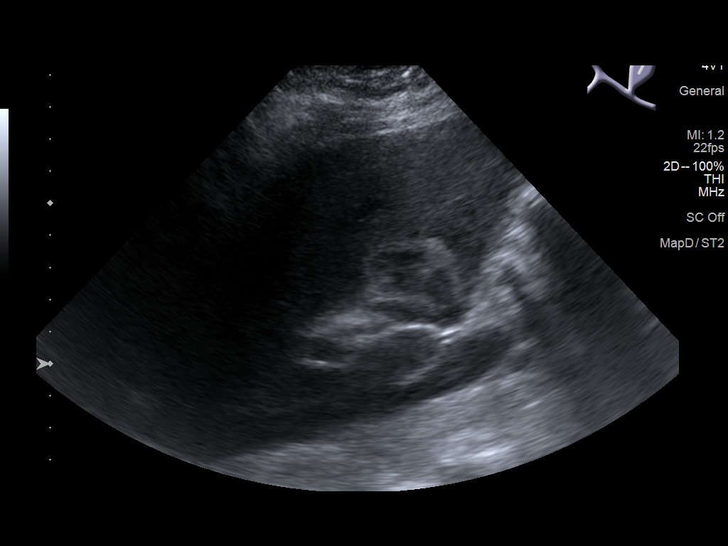
[im 11/41]
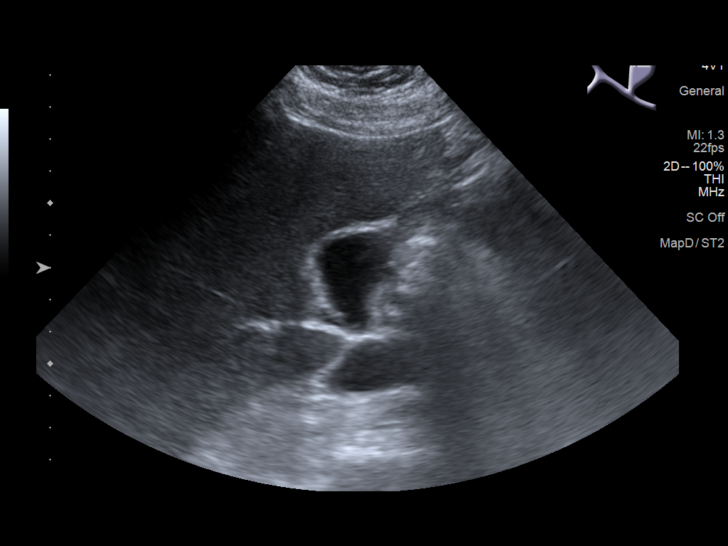
[im 14/41]
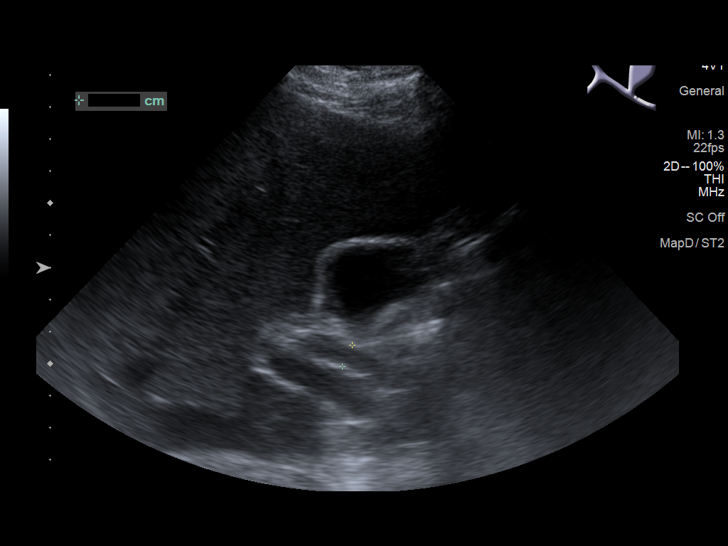
[im 16/41]
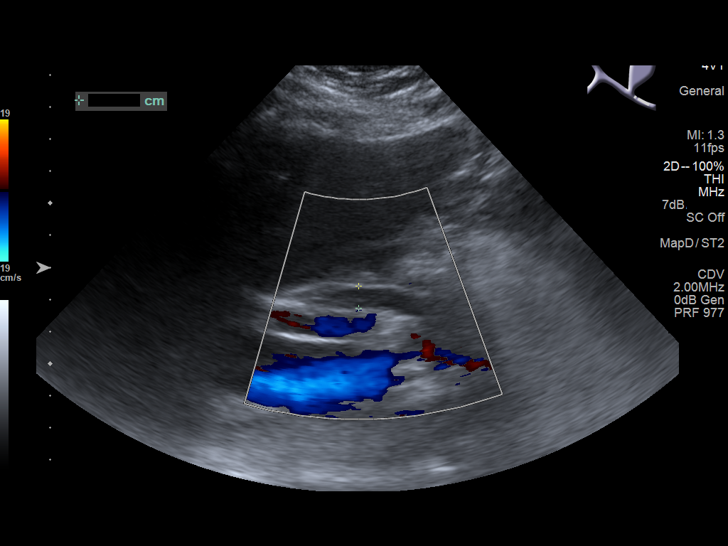
[im 19/41]
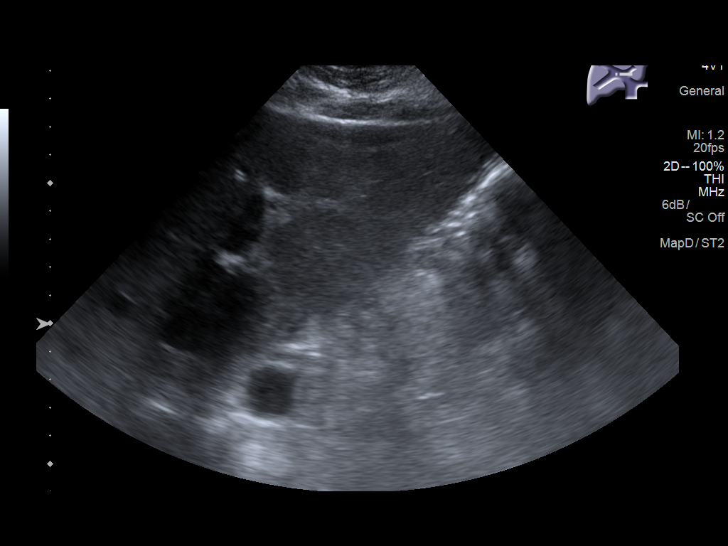
[im 22/41]
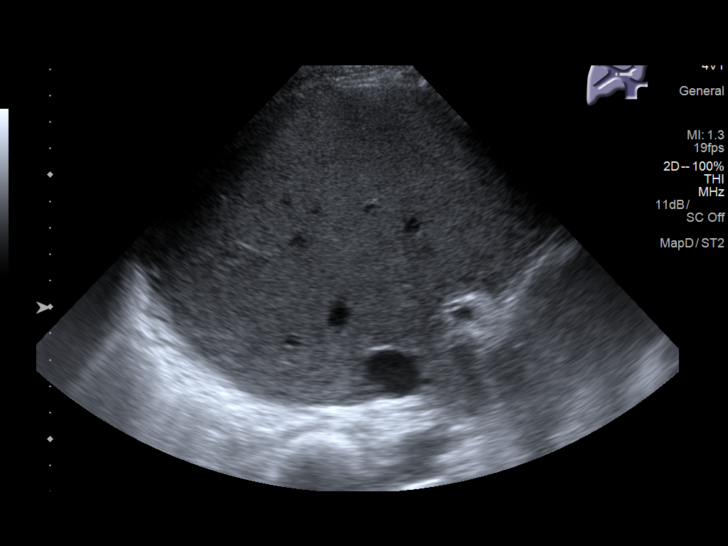
[im 26/41]
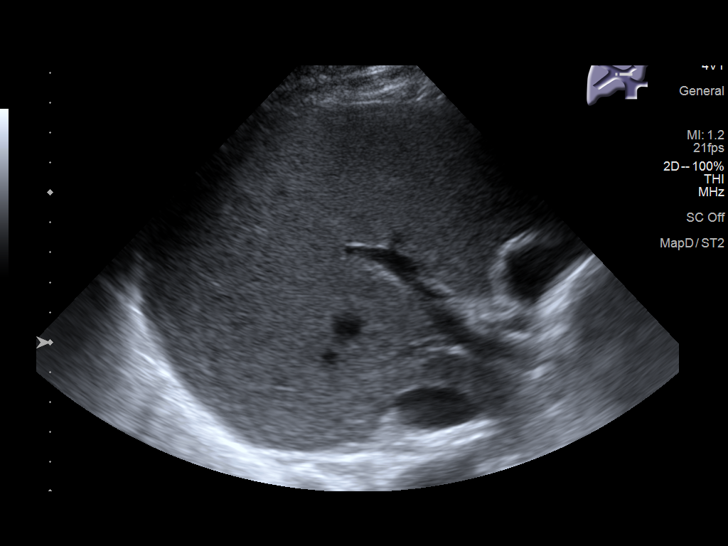
[im 27/41]
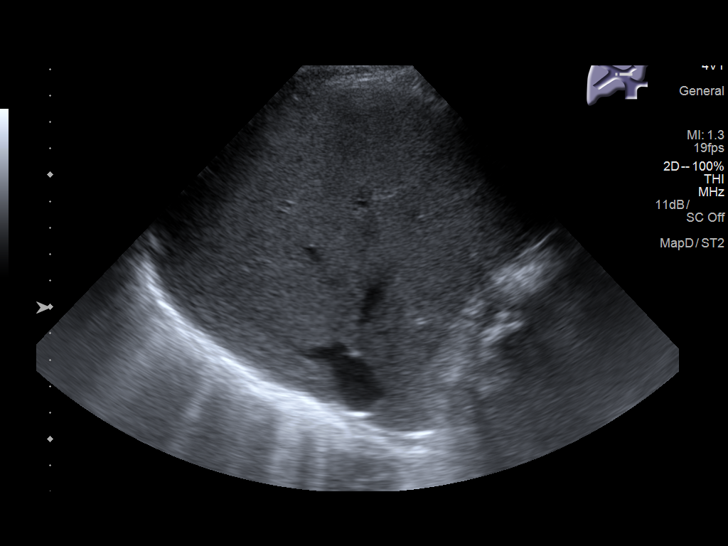
[im 31/41]
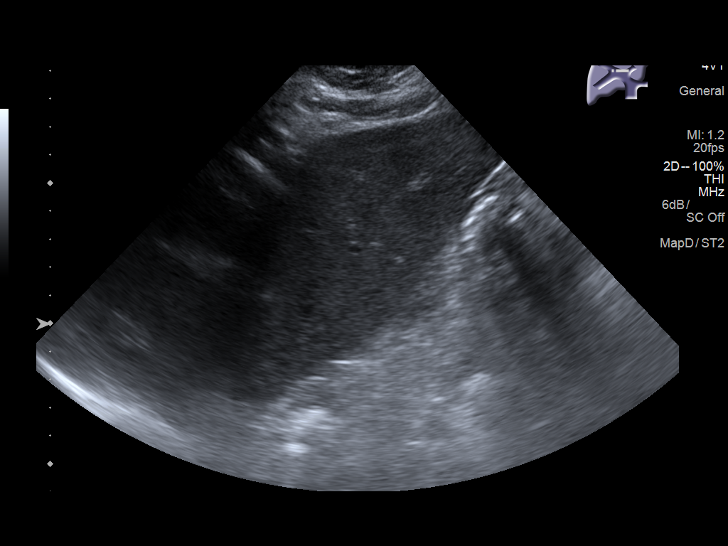
[im 34/41]
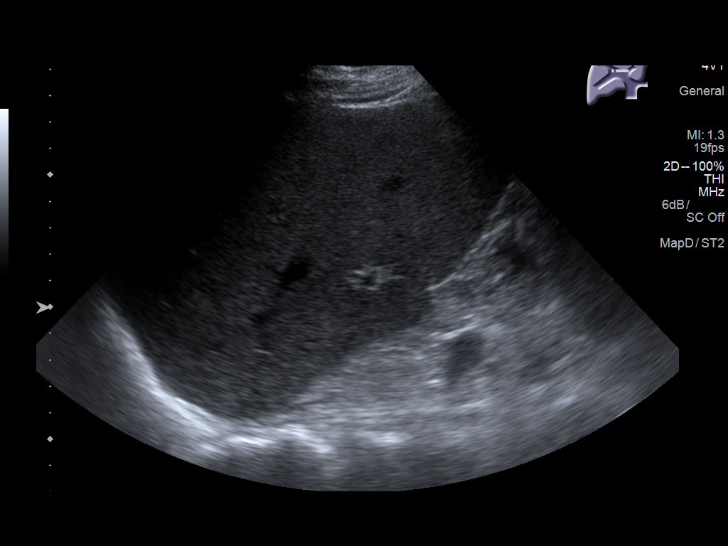
[im 37/41]
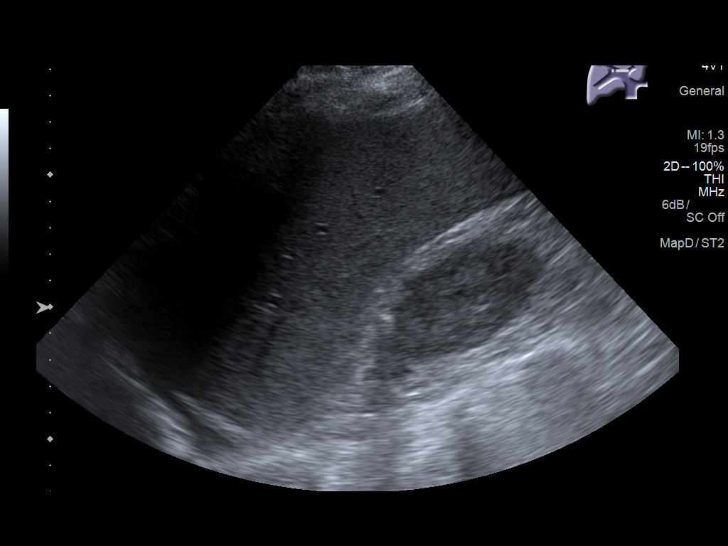
[im 41/41]
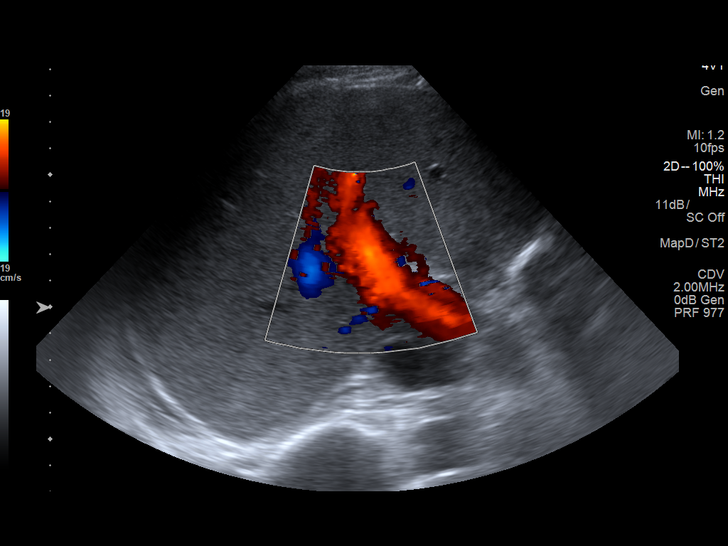

[14 of 25 positions shown; findings below may reference images not displayed]

FINDINGS: Gallbladder:

No gallstones or wall thickening visualized. No sonographic Murphy
sign noted by sonographer.

Common bile duct:

Diameter: 7.3 mm

Liver:

No focal lesion identified. Within normal limits in parenchymal
echogenicity. Portal vein is patent on color Doppler imaging with
normal direction of blood flow towards the liver.
IMPRESSION: Mild common bile duct dilatation of uncertain etiology. Recommend
correlation with labs. An MRCP or ERCP could better evaluate the
common bile duct if clinically warranted.

## 2019-08-30 ENCOUNTER — Other Ambulatory Visit: Payer: Self-pay | Admitting: Adult Health

## 2019-08-30 ENCOUNTER — Other Ambulatory Visit: Payer: Self-pay | Admitting: Internal Medicine

## 2019-08-30 DIAGNOSIS — L709 Acne, unspecified: Secondary | ICD-10-CM

## 2019-08-30 DIAGNOSIS — K219 Gastro-esophageal reflux disease without esophagitis: Secondary | ICD-10-CM

## 2019-08-30 MED FILL — DOXYCYCLINE HYCLATE 100 MG: 100 | 90 days supply | Qty: 90 | Fill #0

## 2019-08-30 MED FILL — VIT D2 1.25 MG (50,000 UNIT: 1.25 MG | 84 days supply | Qty: 24 | Fill #0

## 2019-08-30 MED FILL — LEVOTHYROXINE 75 MCG TABLET: 75 | 90 days supply | Qty: 90 | Fill #1

## 2019-08-30 MED FILL — OMEPRAZOLE 20 MG CAP: 20 | 90 days supply | Qty: 90 | Fill #0

## 2019-08-30 MED FILL — LINZESS 145 MCG CAPSULE: 145 | 90 days supply | Qty: 90 | Fill #1

## 2019-08-30 MED FILL — NEXLIZET 180-10 MG TABS: 180-10 | 90 days supply | Qty: 90 | Fill #1

## 2019-10-12 ENCOUNTER — Other Ambulatory Visit: Payer: Self-pay | Admitting: Internal Medicine

## 2019-10-13 ENCOUNTER — Other Ambulatory Visit: Payer: Self-pay | Admitting: Adult Health

## 2019-10-13 MED FILL — FLUoxetine HCL 20 MG CAPS: 20 | 90 days supply | Qty: 180 | Fill #0

## 2019-10-16 NOTE — Progress Notes (Signed)
Complete Physical  Assessment and Plan:  Alison Alvarez was seen today for annual exam.  Diagnoses and all orders for this visit:  Encounter for routine adult health examination with abnormal findings  Gastroesophageal reflux disease, esophagitis presence not specified Hiatal hernia Well managed on current medications Discussed diet, avoiding triggers and other lifestyle changes -     COMPLETE METABOLIC PANEL WITH GFR -     Magnesium  Fatty liver Weight loss advised, avoid alcohol/tylenol, will monitor LFTs -     COMPLETE METABOLIC PANEL WITH GFR  Thyroid disease continue medications the same pending lab results reminded to take on an empty stomach 30-32mins before food.  check TSH level -     TSH  History of prediabetes -     Hemoglobin A1c -     Urinalysis w microscopic + reflex cultur  Acne, unspecified acne type Improved with doxycycline 100 mg daily Hygiene reviewed -     doxycycline (VIBRAMYCIN) 100 MG capsule; Take 1 capsule daily with food for acne.  Anxiety Well managed by current regimen; continue medications Stress management techniques discussed, increase water, good sleep hygiene discussed, increase exercise, and increase veggies.   Mixed hyperlipidemia/ statin myopathy  Continue medications: nexlizet Continue low cholesterol diet and exercise.  Check lipid panel.  -     TSH -     Lipid panel  Chronic low back pain without sciatica, unspecified back pain laterality Monitor, managing with OTC analgesics  Vitamin D deficiency -     VITAMIN D 25 Hydroxy (Vit-D Deficiency, Fractures)  Environmental allergies Continue OTC allergy pills  Overweight (BMI 25.0-29.9) Long discussion about weight loss, diet, and exercise Recommended diet heavy in fruits and veggies and low in animal meats, cheeses, and dairy products, appropriate calorie intake Patient will work on - [employer] Discussed appropriate weight for height Follow up at next visit  Family  history of heart disease -     EKG 12-Lead  Orders Placed This Encounter  Procedures   CBC with Differential/Platelet   COMPLETE METABOLIC PANEL WITH GFR   Magnesium   Lipid panel   TSH   Hemoglobin A1c   VITAMIN D 25 Hydroxy (Vit-D Deficiency, Fractures)   Urinalysis, Routine w reflex microscopic   Iron   Ferritin   EKG 12-Lead   Discussed med's effects and SE's. Screening labs and tests as requested with regular follow-up as recommended. Over 40 minutes of exam, counseling, chart review, and complex, high level critical decision making was performed this visit.   Future Appointments  Date Time Provider [place]  04/18/2020  4:15 PM Liane Comber, NP [facility] None  10/21/2020  3:00 PM Liane Comber, NP [facility] None     HPI  59 y.o. female  presents for a complete physical and follow up for has Anxiety; Hyperlipidemia; Thyroid disease; History of prediabetes; Chronic back pain; Acne; Vitamin D deficiency; Environmental allergies; GERD (gastroesophageal reflux disease); Overweight (BMI 25.0-29.9); Statin myopathy; and Fatty liver on their problem list.    Single, no kids.  She works as Designer, fashion/clothing at [employer].   No concerns today. Follows Dr. Gaetano Net GYN annually.  She has cystic acne and folliculitis secondary to PCOS/hirstutism, gets electrolysis which improves significantly. Also uses doxycycline 100 mg.   she has a diagnosis of anxiety and is currently on prozac 20 mg daily, xanax 0.25 mg PRN, reports symptoms are well controlled on current regimen. she reports she uses rarely, typically when flying.   she has a diagnosis  of GERD which is currently managed by omeprazole 20 mg due to GERD with hiatal hernia, has failed attempt to taper off of PPI.   BMI is Body mass index is 28.92 kg/m., she has been working on diet and exercise, has Doing weight watchers, she is doing elliptical at G[employer] at work as able, mask mandates limit, will go  walking.  Wt Readings from Last 3 Encounters:  10/18/19 155 lb 9.6 oz (70.6 kg)  01/24/19 157 lb (71.2 kg)  10/18/18 153 lb 6.4 oz (69.6 kg)   Her blood pressure has been controlled at home, today their BP is BP: 104/64 She does workout. She denies chest pain, shortness of breath, dizziness.   She is on cholesterol medication (nexlizet - myalgias/brain fog with even low dose statin) and denies myalgias. Her cholesterol is not at goal. The cholesterol last visit was:   Lab Results  Component Value Date   CHOL 213 (H) 01/24/2019   HDL 60 01/24/2019   LDLCALC 106 (H) 01/24/2019   TRIG 349 (H) 01/24/2019   CHOLHDL 3.6 01/24/2019    Last A1C in the office was:  Lab Results  Component Value Date   HGBA1C 5.5 10/18/2018   Last GFR: Lab Results  Component Value Date   GFRNONAA 79 01/24/2019   Patient is on Vitamin D supplement, has reduced to once weekly  Lab Results  Component Value Date   VD25OH 131 (H) 10/18/2018     She is on thyroid medication. Her medication was not changed last visit.   Lab Results  Component Value Date   TSH 0.77 01/24/2019  .    Current Medications:  Current Outpatient Medications on File Prior to Visit  Medication Sig Dispense Refill   acetaminophen (TYLENOL) 500 MG tablet Take 1 tablet (500 mg total) by mouth every 6 (six) hours as needed for mild pain, fever or headache. 30 tablet    albuterol (VENTOLIN HFA) 108 (90 Base) MCG/ACT inhaler Inhale 2 puffs into the lungs every 6 (six) hours as needed for wheezing or shortness of breath. 1 Inhaler 2   ALPRAZolam (XANAX) 0.25 MG tablet Take 1 tablet (0.25 mg total) by mouth 3 (three) times daily. 90 tablet 0   Bioflavonoid Products (PERIDRIN-C) 200-50-150 MG TABS Take 2 tablets by mouth daily.      cetirizine (ZYRTEC ALLERGY) 10 MG tablet Take 10 mg by mouth daily.     doxycycline (VIBRAMYCIN) 100 MG capsule TAKE 1 CAPSULE BY MOUTH ONCE A DAY WITH FOOD FOR ACNE 90 capsule 0   FLUoxetine (PROZAC)  20 MG capsule TAKE 1 CAPSULE BY MOUTH TWICE A DAY FOR MOOD 180 capsule 1   levothyroxine (SYNTHROID) 75 MCG tablet Take 1 tablet daily on an empty stomach with only water for 30 minutes & no Antacid meds, Calcium or Magnesium for 4 hours & avoid Biotin 90 tablet 1   linaclotide (LINZESS) 145 MCG CAPS capsule Take 1 capsule Daily for Constipation 90 capsule 1   NEXLIZET 180-10 MG TABS TAKE 1 TABLET BY MOUTH DAILY. 90 tablet 1   omeprazole (PRILOSEC) 20 MG capsule TAKE 1 CAPSULE BY MOUTH DAILY FOR HEARTBURN & REFLUX 90 capsule 0   Vitamin D, Ergocalciferol, (DRISDOL) 1.25 MG (50000 UNIT) CAPS capsule TAKE 1 CAPSULE BY MOUTH TWICE A WEEK 24 capsule 1   mometasone (NASONEX) 50 MCG/ACT nasal spray Place 2 sprays into the nose daily. (Patient not taking: Reported on 10/18/2019) 17 g 0   spironolactone (ALDACTONE) 50 MG tablet Take  1 tablet (50 mg total) by mouth daily. (Patient not taking: Reported on 10/18/2019) 90 tablet 1   No current facility-administered medications on file prior to visit.   Allergies:  Allergies  Allergen Reactions   Advicor [Niacin-Lovastatin Er] Swelling    Facial swelling   Niacin-Lovastatin Er Swelling and Other (See Comments)    Extreme flushing   Medical History:  She has Anxiety; Hyperlipidemia; Thyroid disease; History of prediabetes; Chronic back pain; Acne; Vitamin D deficiency; Environmental allergies; GERD (gastroesophageal reflux disease); Overweight (BMI 25.0-29.9); Statin myopathy; and Fatty liver on their problem list.   Health Maintenance:   Immunization History  Administered Date(s) Administered   DTaP 01/27/2003   Influenza-Unspecified 10/26/2012, 10/27/2018   PFIZER SARS-COV-2 Vaccination 01/24/2019, 02/14/2019   Tdap 07/26/2013   Tetanus: 2015 Pneumovax: Prevnar 13:  Flu vaccine: at work at [employer]: discussed check with insurance Covid 19: 2/2, 2021, pfizer  LMP: No LMP recorded. Patient is postmenopausal. Pap: 2020 Dr.  Gaetano Net, has scheduled upcoming  MGM: 04/2019 DEXA: 2015 Colonoscopy: 07/2010 due 10 years EGD: 2012  Last vision exam: wears glasses, Dr. Posey Pronto, last 08/2019, glasses Last dental exam: goes q42m, 2021  Patient Care Team: Unk Pinto, MD as PCP - General (Internal Medicine) Ladene Artist, MD as Consulting Physician (Gastroenterology) Everlene Farrier, MD as Consulting Physician (Obstetrics and Gynecology) Emelia Loron, MD as Referring Physician (Neurosurgery)  Surgical History:  She has a past surgical history that includes Synovial cyst excision (2010); Brain meningioma excision (1965); Tonsillectomy; wisom teeth (1984); Diagnostic laparoscopy; and Breast excisional biopsy (Left). Family History:  Herfamily history includes Breast cancer in her paternal aunt; Cancer in her maternal grandfather and paternal aunt; Dementia in her mother; Diabetes in her father; Endometrial cancer in her sister; Heart disease in her father; Hyperlipidemia in her brother and father; Hypertension in her brother and father; Osteoporosis in her mother; Parkinson's disease in her father; Stroke in her father. Social History:  She reports that she has never smoked. She has never used smokeless tobacco. She reports current alcohol use. She reports that she does not use drugs.  Review of Systems: Review of Systems  Constitutional: Negative for malaise/fatigue and weight loss.  HENT: Negative for hearing loss and tinnitus.   Eyes: Negative for blurred vision and double vision.  Respiratory: Negative for cough, sputum production, shortness of breath and wheezing.   Cardiovascular: Negative for chest pain, palpitations, orthopnea, claudication, leg swelling and PND.  Gastrointestinal: Negative for abdominal pain, blood in stool, constipation, diarrhea, heartburn, melena, nausea and vomiting.  Genitourinary: Negative.   Musculoskeletal: Negative for falls, joint pain and myalgias.  Skin: Negative for rash.   Neurological: Negative for dizziness, tingling, sensory change, weakness and headaches.  Endo/Heme/Allergies: Negative for polydipsia.  Psychiatric/Behavioral: Negative.  Negative for depression, memory loss, substance abuse and suicidal ideas. The patient is not nervous/anxious and does not have insomnia.   All other systems reviewed and are negative.   Physical Exam: Estimated body mass index is 28.92 kg/m as calculated from the following:   Height as of this encounter: 5' 1.5" (1.562 m).   Weight as of this encounter: 155 lb 9.6 oz (70.6 kg). BP 104/64    Pulse 69    Temp (!) 97.3 F (36.3 C)    Ht 5' 1.5" (1.562 m)    Wt 155 lb 9.6 oz (70.6 kg)    SpO2 97%    BMI 28.92 kg/m  General Appearance: Well nourished, in no apparent distress.  Eyes:  PERRLA, EOMs, conjunctiva no swelling or erythema, fundal exam deferred to ophth Sinuses: No Frontal/maxillary tenderness  ENT/Mouth: Ext aud canals clear, normal light reflex with TMs without erythema, bulging. Good dentition. No erythema, swelling, or exudate on post pharynx. Tonsils not swollen or erythematous. Hearing normal.  Neck: Supple, thyroid normal. No bruits  Respiratory: Respiratory effort normal, BS equal bilaterally without rales, rhonchi, wheezing or stridor.  Cardio: RRR without murmurs, rubs or gallops. Brisk peripheral pulses without edema.  Chest: symmetric, with normal excursions and percussion.  Breasts: Defer to GYN Abdomen: Soft, nontender, no guarding, rebound, hernias, masses, or organomegaly.  Lymphatics: Non tender without lymphadenopathy.  Genitourinary: Defer to GYN Musculoskeletal: Full ROM all peripheral extremities,5/5 strength, and normal gait.  Skin: Warm, dry without rashes, lesions, ecchymosis. Neuro: Cranial nerves intact, reflexes equal bilaterally. Normal muscle tone, no cerebellar symptoms. Sensation intact.  Psych: Awake and oriented X 3, normal affect, Insight and Judgment appropriate.   EKG: NSR no  ST changes.  Izora Ribas 5:49 PM [facility]

## 2019-10-18 ENCOUNTER — Encounter: Payer: Self-pay | Admitting: Adult Health

## 2019-10-18 ENCOUNTER — Other Ambulatory Visit: Payer: Self-pay

## 2019-10-18 ENCOUNTER — Ambulatory Visit: Payer: 59 | Admitting: Adult Health

## 2019-10-18 VITALS — BP 104/64 | HR 69 | Temp 97.3°F | Ht 61.5 in | Wt 155.6 lb

## 2019-10-18 DIAGNOSIS — Z9109 Other allergy status, other than to drugs and biological substances: Secondary | ICD-10-CM

## 2019-10-18 DIAGNOSIS — L709 Acne, unspecified: Secondary | ICD-10-CM

## 2019-10-18 DIAGNOSIS — Z0000 Encounter for general adult medical examination without abnormal findings: Secondary | ICD-10-CM | POA: Diagnosis not present

## 2019-10-18 DIAGNOSIS — Z8249 Family history of ischemic heart disease and other diseases of the circulatory system: Secondary | ICD-10-CM

## 2019-10-18 DIAGNOSIS — R030 Elevated blood-pressure reading, without diagnosis of hypertension: Secondary | ICD-10-CM

## 2019-10-18 DIAGNOSIS — E663 Overweight: Secondary | ICD-10-CM

## 2019-10-18 DIAGNOSIS — Z130 Encounter for screening for diseases of the blood and blood-forming organs and certain disorders involving the immune mechanism: Secondary | ICD-10-CM | POA: Diagnosis not present

## 2019-10-18 DIAGNOSIS — E782 Mixed hyperlipidemia: Secondary | ICD-10-CM

## 2019-10-18 DIAGNOSIS — K449 Diaphragmatic hernia without obstruction or gangrene: Secondary | ICD-10-CM

## 2019-10-18 DIAGNOSIS — Z136 Encounter for screening for cardiovascular disorders: Secondary | ICD-10-CM

## 2019-10-18 DIAGNOSIS — E559 Vitamin D deficiency, unspecified: Secondary | ICD-10-CM

## 2019-10-18 DIAGNOSIS — T466X5A Adverse effect of antihyperlipidemic and antiarteriosclerotic drugs, initial encounter: Secondary | ICD-10-CM

## 2019-10-18 DIAGNOSIS — E079 Disorder of thyroid, unspecified: Secondary | ICD-10-CM | POA: Diagnosis not present

## 2019-10-18 DIAGNOSIS — K219 Gastro-esophageal reflux disease without esophagitis: Secondary | ICD-10-CM | POA: Diagnosis not present

## 2019-10-18 DIAGNOSIS — K760 Fatty (change of) liver, not elsewhere classified: Secondary | ICD-10-CM | POA: Diagnosis not present

## 2019-10-18 DIAGNOSIS — G8929 Other chronic pain: Secondary | ICD-10-CM

## 2019-10-18 DIAGNOSIS — Z131 Encounter for screening for diabetes mellitus: Secondary | ICD-10-CM

## 2019-10-18 DIAGNOSIS — G720 Drug-induced myopathy: Secondary | ICD-10-CM

## 2019-10-18 DIAGNOSIS — F419 Anxiety disorder, unspecified: Secondary | ICD-10-CM

## 2019-10-18 DIAGNOSIS — R790 Abnormal level of blood mineral: Secondary | ICD-10-CM | POA: Diagnosis not present

## 2019-10-18 DIAGNOSIS — M545 Low back pain, unspecified: Secondary | ICD-10-CM

## 2019-10-18 DIAGNOSIS — Z87898 Personal history of other specified conditions: Secondary | ICD-10-CM

## 2019-10-18 NOTE — Patient Instructions (Addendum)
Alison Alvarez , Thank you for taking time to come for your Annual Wellness Visit. I appreciate your ongoing commitment to your health goals. Please review the following plan we discussed and let me know if I can assist you in the future.   These are the goals we discussed: Goals    . LDL CALC < 100    . Weight (lb) < 145 lb (65.8 kg)       This is a list of the screening recommended for you and due dates:  Health Maintenance  Topic Date Due  . Pap Smear  04/18/2019  . Flu Shot  08/27/2019  . Colon Cancer Screening  08/20/2020  . Mammogram  05/15/2021  . Tetanus Vaccine  07/27/2023  . COVID-19 Vaccine  Completed  .  Hepatitis C: One time screening is recommended by Center for Disease Control  (CDC) for  adults born from [birth year] through 1965.   Completed  . HIV Screening  Completed    Check with insurance about shingrix coverage  Look into IPL hair removal    Know what a healthy weight is for you (roughly BMI <25) and aim to maintain this  Aim for 7+ servings of fruits and vegetables daily  65-80+ fluid ounces of water or unsweet tea for healthy kidneys  Limit to max 1 drink of alcohol per day; avoid smoking/tobacco  Limit animal fats in diet for cholesterol and heart health - choose grass fed whenever available  Avoid highly processed foods, and foods high in saturated/trans fats  Aim for low stress - take time to unwind and care for your mental health  Aim for 150 min of moderate intensity exercise weekly for heart health, and weights twice weekly for bone health  Aim for 7-9 hours of sleep daily        High-Fiber Diet Fiber, also called dietary fiber, is a type of carbohydrate that is found in fruits, vegetables, whole grains, and beans. A high-fiber diet can have many health benefits. Your health care provider may recommend a high-fiber diet to help:  Prevent constipation. Fiber can make your bowel movements more regular.  Lower your cholesterol.  Relieve  the following conditions: ? Swelling of veins in the anus (hemorrhoids). ? Swelling and irritation (inflammation) of specific areas of the digestive tract (uncomplicated diverticulosis). ? A problem of the large intestine (colon) that sometimes causes pain and diarrhea (irritable bowel syndrome, IBS).  Prevent overeating as part of a weight-loss plan.  Prevent heart disease, type 2 diabetes, and certain cancers. What is my plan? The recommended daily fiber intake in grams (g) includes:  38 g for men age 46 or younger.  30 g for men over age 69.  9 g for women age 35 or younger.  21 g for women over age 73. You can get the recommended daily intake of dietary fiber by:  Eating a variety of fruits, vegetables, grains, and beans.  Taking a fiber supplement, if it is not possible to get enough fiber through your diet. What do I need to know about a high-fiber diet?  It is better to get fiber through food sources rather than from fiber supplements. There is not a lot of research about how effective supplements are.  Always check the fiber content on the nutrition facts label of any prepackaged food. Look for foods that contain 5 g of fiber or more per serving.  Talk with a diet and nutrition specialist (dietitian) if you have questions about specific  foods that are recommended or not recommended for your medical condition, especially if those foods are not listed below.  Gradually increase how much fiber you consume. If you increase your intake of dietary fiber too quickly, you may have bloating, cramping, or gas.  Drink plenty of water. Water helps you to digest fiber. What are tips for following this plan?  Eat a wide variety of high-fiber foods.  Make sure that half of the grains that you eat each day are whole grains.  Eat breads and cereals that are made with whole-grain flour instead of refined flour or white flour.  Eat brown rice, bulgur wheat, or millet instead of white  rice.  Start the day with a breakfast that is high in fiber, such as a cereal that contains 5 g of fiber or more per serving.  Use beans in place of meat in soups, salads, and pasta dishes.  Eat high-fiber snacks, such as berries, raw vegetables, nuts, and popcorn.  Choose whole fruits and vegetables instead of processed forms like juice or sauce. What foods can I eat?  Fruits Berries. Pears. Apples. Oranges. Avocado. Prunes and raisins. Dried figs. Vegetables Sweet potatoes. Spinach. Kale. Artichokes. Cabbage. Broccoli. Cauliflower. Green peas. Carrots. Squash. Grains Whole-grain breads. Multigrain cereal. Oats and oatmeal. Brown rice. Barley. Bulgur wheat. [place]. Quinoa. Bran muffins. Popcorn. Rye wafer crackers. Meats and other proteins Navy, kidney, and pinto beans. Soybeans. Split peas. Lentils. Nuts and seeds. Dairy Fiber-fortified yogurt. Beverages Fiber-fortified soy milk. Fiber-fortified orange juice. Other foods Fiber bars. The items listed above may not be a complete list of recommended foods and beverages. Contact a dietitian for more options. What foods are not recommended? Fruits Fruit juice. Cooked, strained fruit. Vegetables Fried potatoes. Canned vegetables. Well-cooked vegetables. Grains White bread. Pasta made with refined flour. White rice. Meats and other proteins Fatty cuts of meat. Fried chicken or fried fish. Dairy Milk. Yogurt. Cream cheese. Sour cream. Fats and oils Butters. Beverages Soft drinks. Other foods Cakes and pastries. The items listed above may not be a complete list of foods and beverages to avoid. Contact a dietitian for more information. Summary  Fiber is a type of carbohydrate. It is found in fruits, vegetables, whole grains, and beans.  There are many health benefits of eating a high-fiber diet, such as preventing constipation, lowering blood cholesterol, helping with weight loss, and reducing your risk of heart disease,  diabetes, and certain cancers.  Gradually increase your intake of fiber. Increasing too fast can result in cramping, bloating, and gas. Drink plenty of water while you increase your fiber.  The best sources of fiber include whole fruits and vegetables, whole grains, nuts, seeds, and beans. This information is not intended to replace advice given to you by your health care provider. Make sure you discuss any questions you have with your health care provider. Document Revised: 11/16/2016 Document Reviewed: 11/16/2016 Elsevier Patient Education  [place].      Zoster Vaccine, Recombinant injection What is this medicine? ZOSTER VACCINE (ZOS ter vak SEEN) is used to prevent shingles in adults 59 years old and over. This vaccine is not used to treat shingles or nerve pain from shingles. This medicine may be used for other purposes; ask your health care provider or pharmacist if you have questions. COMMON BRAND NAME(S): [facility] What should I tell my health care provider before I take this medicine? They need to know if you have any of these conditions:  blood disorders or disease  cancer like leukemia or lymphoma  immune system problems or therapy  an unusual or allergic reaction to vaccines, other medications, foods, dyes, or preservatives  pregnant or trying to get pregnant  breast-feeding How should I use this medicine? This vaccine is for injection in a muscle. It is given by a health care professional. Talk to your pediatrician regarding the use of this medicine in children. This medicine is not approved for use in children. Overdosage: If you think you have taken too much of this medicine contact a poison control center or emergency room at once. NOTE: This medicine is only for you. Do not share this medicine with others. What if I miss a dose? Keep appointments for follow-up (booster) doses as directed. It is important not to miss your dose. Call your doctor or health  care professional if you are unable to keep an appointment. What may interact with this medicine?  medicines that suppress your immune system  medicines to treat cancer  steroid medicines like prednisone or cortisone This list may not describe all possible interactions. Give your health care provider a list of all the medicines, herbs, non-prescription drugs, or dietary supplements you use. Also tell them if you smoke, drink alcohol, or use illegal drugs. Some items may interact with your medicine. What should I watch for while using this medicine? Visit your doctor for regular check ups. This vaccine, like all vaccines, may not fully protect everyone. What side effects may I notice from receiving this medicine? Side effects that you should report to your doctor or health care professional as soon as possible:  allergic reactions like skin rash, itching or hives, swelling of the face, lips, or tongue  breathing problems Side effects that usually do not require medical attention (report these to your doctor or health care professional if they continue or are bothersome):  chills  headache  fever  nausea, vomiting  redness, warmth, pain, swelling or itching at site where injected  tiredness This list may not describe all possible side effects. Call your doctor for medical advice about side effects. You may report side effects to FDA at 1-800-FDA-1088. Where should I keep my medicine? This vaccine is only given in a clinic, pharmacy, doctor's office, or other health care setting and will not be stored at home. NOTE: This sheet is a summary. It may not cover all possible information. If you have questions about this medicine, talk to your doctor, pharmacist, or health care provider.  2020 Elsevier/Gold Standard (2016-08-24 13:20:30)

## 2019-10-19 ENCOUNTER — Other Ambulatory Visit: Payer: Self-pay | Admitting: Adult Health

## 2019-10-19 DIAGNOSIS — E079 Disorder of thyroid, unspecified: Secondary | ICD-10-CM

## 2019-10-19 DIAGNOSIS — E039 Hypothyroidism, unspecified: Secondary | ICD-10-CM

## 2019-10-19 LAB — LIPID PANEL
Cholesterol: 181 mg/dL (ref <200)
HDL: 52 mg/dL (ref >=50)
LDL Cholesterol (Calc): 97 mg/dL (calc)
Non-HDL Cholesterol (Calc): 129 mg/dL (calc) (ref <130)
Total CHOL/HDL Ratio: 3.5 (calc) (ref <5.0)
Triglycerides: 219 mg/dL — ABNORMAL HIGH (ref <150)

## 2019-10-19 LAB — CBC WITH DIFFERENTIAL/PLATELET
Absolute Monocytes: 332 cells/uL (ref 200–950)
Basophils Absolute: 21 cells/uL (ref 0–200)
Basophils Relative: 0.5 %
Eosinophils Absolute: 71 cells/uL (ref 15–500)
Eosinophils Relative: 1.7 %
HCT: 37.1 % (ref 35.0–45.0)
Hemoglobin: 12.6 g/dL (ref 11.7–15.5)
Lymphs Abs: 1449 cells/uL (ref 850–3900)
MCH: 32.1 pg (ref 27.0–33.0)
MCHC: 34 g/dL (ref 32.0–36.0)
MCV: 94.6 fL (ref 80.0–100.0)
MPV: 10.2 fL (ref 7.5–12.5)
Monocytes Relative: 7.9 %
Neutro Abs: 2327 cells/uL (ref 1500–7800)
Neutrophils Relative %: 55.4 %
Platelets: 317 10*3/uL (ref 140–400)
RBC: 3.92 10*6/uL (ref 3.80–5.10)
RDW: 12.3 % (ref 11.0–15.0)
Total Lymphocyte: 34.5 %
WBC: 4.2 10*3/uL (ref 3.8–10.8)

## 2019-10-19 LAB — COMPLETE METABOLIC PANEL WITH GFR
AG Ratio: 2.4 (calc) (ref 1.0–2.5)
ALT: 23 U/L (ref 6–29)
AST: 26 U/L (ref 10–35)
Albumin: 4.6 g/dL (ref 3.6–5.1)
Alkaline phosphatase (APISO): 48 U/L (ref 37–153)
BUN: 17 mg/dL (ref 7–25)
CO2: 25 mmol/L (ref 20–32)
Calcium: 9.7 mg/dL (ref 8.6–10.4)
Chloride: 99 mmol/L (ref 98–110)
Creat: 0.78 mg/dL (ref 0.50–1.05)
GFR, Est African American: 96 mL/min/{1.73_m2} (ref >=60)
GFR, Est Non African American: 83 mL/min/{1.73_m2} (ref >=60)
Globulin: 1.9 g/dL (calc) (ref 1.9–3.7)
Glucose, Bld: 93 mg/dL (ref 65–99)
Potassium: 4 mmol/L (ref 3.5–5.3)
Sodium: 135 mmol/L (ref 135–146)
Total Bilirubin: 0.4 mg/dL (ref 0.2–1.2)
Total Protein: 6.5 g/dL (ref 6.1–8.1)

## 2019-10-19 LAB — HEMOGLOBIN A1C
Hgb A1c MFr Bld: 5.5 % of total Hgb (ref <5.7)
Mean Plasma Glucose: 111 (calc)
eAG (mmol/L): 6.2 (calc)

## 2019-10-19 LAB — URINALYSIS, ROUTINE W REFLEX MICROSCOPIC
Bilirubin Urine: NEGATIVE
Glucose, UA: NEGATIVE
Hgb urine dipstick: NEGATIVE
Ketones, ur: NEGATIVE
Leukocytes,Ua: NEGATIVE
Nitrite: NEGATIVE
Protein, ur: NEGATIVE
Specific Gravity, Urine: 1.008 (ref 1.001–1.03)
pH: 5.5 (ref 5.0–8.0)

## 2019-10-19 LAB — TSH: TSH: 0.06 mIU/L — ABNORMAL LOW (ref 0.40–4.50)

## 2019-10-19 LAB — IRON: Iron: 84 ug/dL (ref 45–160)

## 2019-10-19 LAB — FERRITIN: Ferritin: 44 ng/mL (ref 16–232)

## 2019-10-19 LAB — VITAMIN D 25 HYDROXY (VIT D DEFICIENCY, FRACTURES): Vit D, 25-Hydroxy: 89 ng/mL (ref 30–100)

## 2019-10-19 LAB — MAGNESIUM: Magnesium: 1.9 mg/dL (ref 1.5–2.5)

## 2019-10-19 MED ORDER — LEVOTHYROXINE SODIUM 75 MCG PO TABS: ORAL_TABLET | ORAL | 1 refills | Status: DC

## 2019-10-19 MED ORDER — VITAMIN D (ERGOCALCIFEROL) 1.25 MG (50000 UNIT) PO CAPS: ORAL_CAPSULE | ORAL | 1 refills | Status: DC

## 2019-10-31 DIAGNOSIS — H5712 Ocular pain, left eye: Secondary | ICD-10-CM | POA: Diagnosis not present

## 2019-10-31 DIAGNOSIS — H532 Diplopia: Secondary | ICD-10-CM | POA: Diagnosis not present

## 2019-10-31 DIAGNOSIS — H5053 Vertical heterophoria: Secondary | ICD-10-CM | POA: Diagnosis not present

## 2019-10-31 DIAGNOSIS — H5034 Intermittent alternating exotropia: Secondary | ICD-10-CM | POA: Diagnosis not present

## 2019-11-17 IMAGING — CR DG CHEST 2V
2 series · 2 of 2 positions shown · non-contrast
Comparison: 08/22/08.

CLINICAL DATA: Fever and chest tightness.  Cough for 4 days.

EXAM:
CHEST - 2 VIEW

[chest pa]
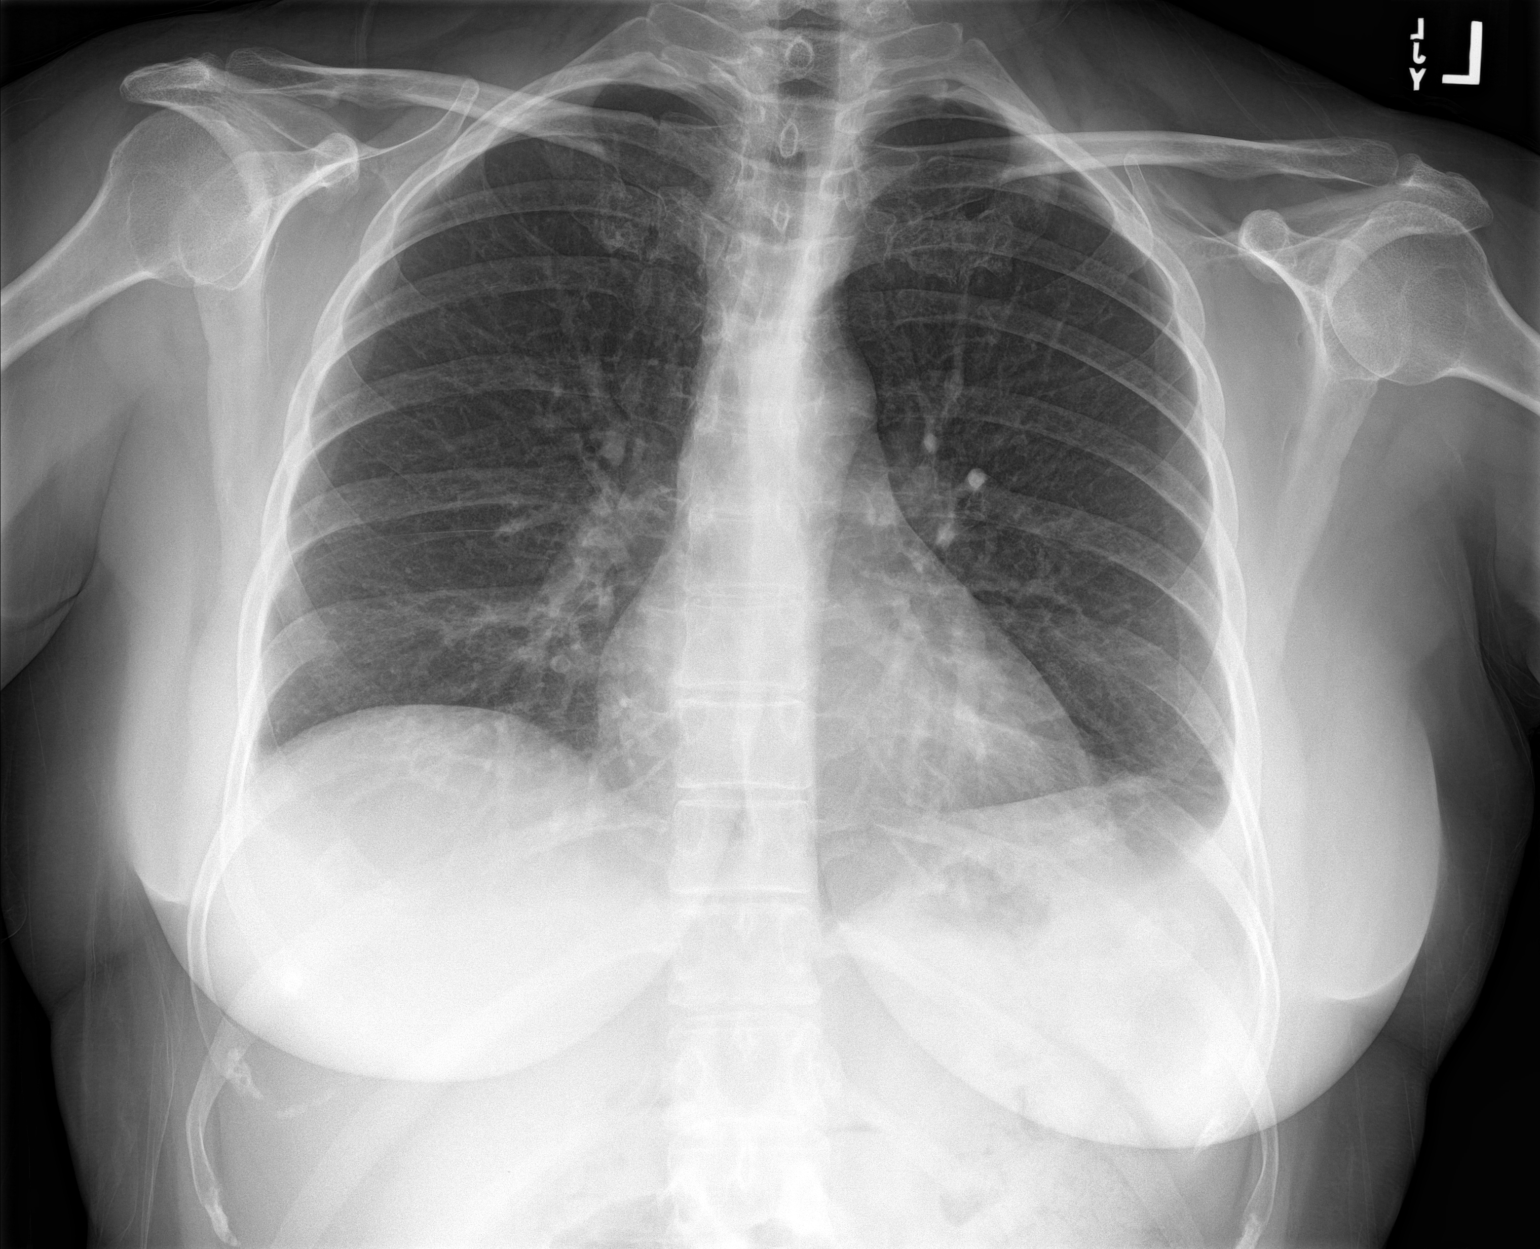

[chest lat]
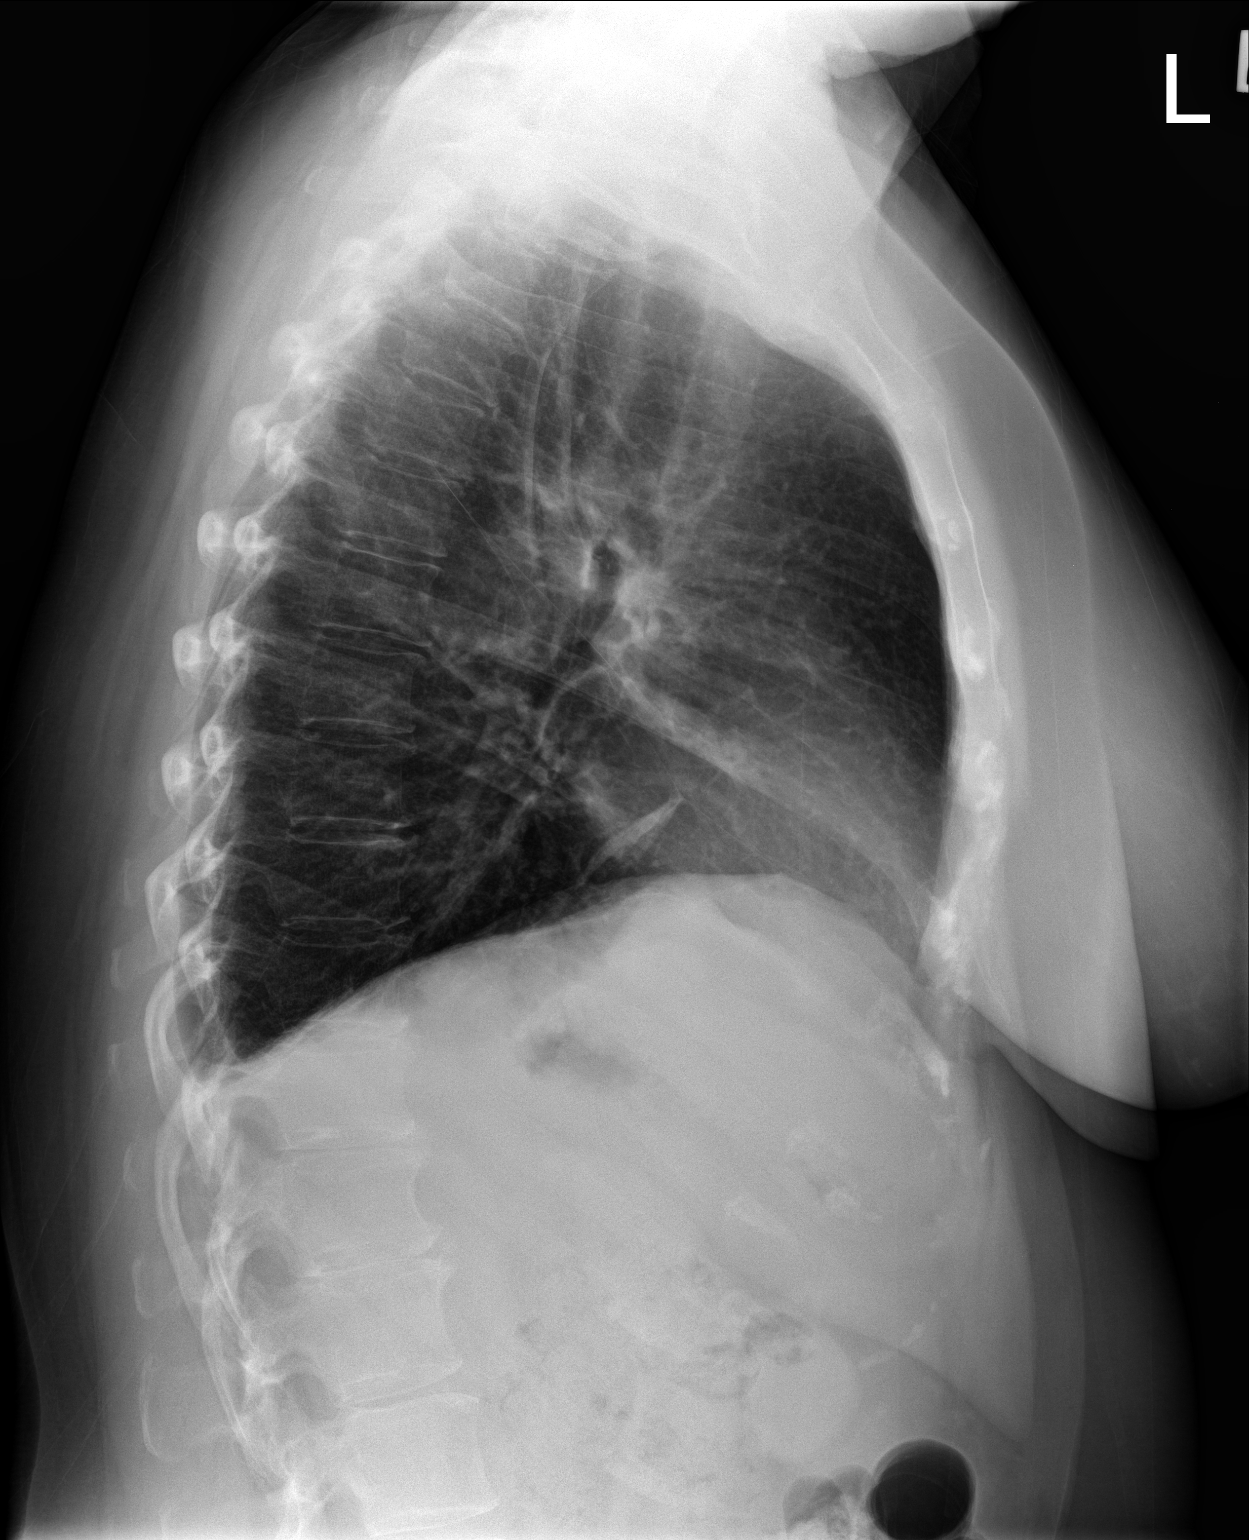

[2 of 2 positions shown; findings below may reference images not displayed]

FINDINGS: The heart size appears normal. There are small bilateral pleural
effusions noted with blunting of the posterior and lateral
costophrenic angles. Etiology indeterminate. No airspace opacities
identified. The visualized osseous structures are unremarkable.
IMPRESSION: 1. Small bilateral pleural effusions are identified.
2. No airspace opacities.

## 2019-11-25 ENCOUNTER — Ambulatory Visit: Payer: 59 | Attending: Internal Medicine

## 2019-11-25 DIAGNOSIS — Z23 Encounter for immunization: Secondary | ICD-10-CM

## 2019-11-25 NOTE — Progress Notes (Signed)
   Covid-19 Vaccination Clinic  Name:  WALLIS SPIZZIRRI    MRN: [id number] DOB: 12/24/1960  11/25/2019  Ms. Housman was observed post Covid-19 immunization for 15 minutes without incident. She was provided with Vaccine Information Sheet and instruction to access the V-Safe system.   Ms. Furber was instructed to call 911 with any severe reactions post vaccine: Marland Kitchen Difficulty breathing  . Swelling of face and throat  . A fast heartbeat  . A bad rash all over body  . Dizziness and weakness

## 2019-12-07 ENCOUNTER — Ambulatory Visit (INDEPENDENT_AMBULATORY_CARE_PROVIDER_SITE_OTHER): Payer: 59

## 2019-12-07 ENCOUNTER — Other Ambulatory Visit: Payer: Self-pay

## 2019-12-07 DIAGNOSIS — E079 Disorder of thyroid, unspecified: Secondary | ICD-10-CM | POA: Diagnosis not present

## 2019-12-07 DIAGNOSIS — N952 Postmenopausal atrophic vaginitis: Secondary | ICD-10-CM | POA: Diagnosis not present

## 2019-12-07 DIAGNOSIS — D259 Leiomyoma of uterus, unspecified: Secondary | ICD-10-CM | POA: Diagnosis not present

## 2019-12-07 DIAGNOSIS — E039 Hypothyroidism, unspecified: Secondary | ICD-10-CM | POA: Diagnosis not present

## 2019-12-07 DIAGNOSIS — Z01419 Encounter for gynecological examination (general) (routine) without abnormal findings: Secondary | ICD-10-CM | POA: Diagnosis not present

## 2019-12-07 DIAGNOSIS — Z6829 Body mass index (BMI) 29.0-29.9, adult: Secondary | ICD-10-CM | POA: Diagnosis not present

## 2019-12-07 LAB — TSH: TSH: 1.96 mIU/L (ref 0.40–4.50)

## 2019-12-07 NOTE — Progress Notes (Signed)
Patient presents to the office for a nurse visit to have labs done to check a TSH. Patient states that she is currently taking Levothyroxine, 80mcg daily except 1/2 tablet on Mon and Thurs. Vitals taken and recorded.

## 2019-12-10 ENCOUNTER — Other Ambulatory Visit: Payer: Self-pay | Admitting: Internal Medicine

## 2019-12-10 DIAGNOSIS — K219 Gastro-esophageal reflux disease without esophagitis: Secondary | ICD-10-CM

## 2019-12-10 DIAGNOSIS — L709 Acne, unspecified: Secondary | ICD-10-CM

## 2019-12-10 MED ORDER — DOXYCYCLINE HYCLATE 100 MG PO CAPS: ORAL_CAPSULE | ORAL | 0 refills | Status: DC

## 2019-12-10 MED ORDER — OMEPRAZOLE 20 MG PO CPDR: DELAYED_RELEASE_CAPSULE | ORAL | 0 refills | Status: DC

## 2019-12-11 ENCOUNTER — Other Ambulatory Visit: Payer: Self-pay | Admitting: Adult Health

## 2019-12-11 DIAGNOSIS — E782 Mixed hyperlipidemia: Secondary | ICD-10-CM

## 2019-12-11 DIAGNOSIS — K219 Gastro-esophageal reflux disease without esophagitis: Secondary | ICD-10-CM

## 2019-12-11 DIAGNOSIS — L709 Acne, unspecified: Secondary | ICD-10-CM

## 2019-12-11 MED FILL — DOXYCYCLINE HYCLATE 100 MG: 100 | 90 days supply | Qty: 90 | Fill #0

## 2019-12-11 MED FILL — NEXLIZET 180-10 MG TABS: 180-10 | 90 days supply | Qty: 90 | Fill #0

## 2019-12-11 MED FILL — OMEPRAZOLE 20 MG CAP: 20 | 90 days supply | Qty: 90 | Fill #0

## 2019-12-12 LAB — HM PAP SMEAR: HM Pap smear: NORMAL

## 2019-12-17 ENCOUNTER — Other Ambulatory Visit: Payer: Self-pay | Admitting: Internal Medicine

## 2019-12-17 DIAGNOSIS — E039 Hypothyroidism, unspecified: Secondary | ICD-10-CM

## 2019-12-17 MED ORDER — LEVOTHYROXINE SODIUM 75 MCG PO TABS: ORAL_TABLET | ORAL | 1 refills | Status: DC

## 2019-12-18 MED FILL — LEVOTHYROXINE 75 MCG TABLET: 75 | 90 days supply | Qty: 90 | Fill #0

## 2020-01-17 DIAGNOSIS — H532 Diplopia: Secondary | ICD-10-CM | POA: Diagnosis not present

## 2020-01-30 MED FILL — FLUoxetine HCL 20 MG CAPS: 20 | 90 days supply | Qty: 180 | Fill #1

## 2020-03-09 ENCOUNTER — Other Ambulatory Visit: Payer: Self-pay | Admitting: Internal Medicine

## 2020-03-09 DIAGNOSIS — K219 Gastro-esophageal reflux disease without esophagitis: Secondary | ICD-10-CM

## 2020-03-09 MED FILL — NEXLIZET 180-10 MG TABS: 180-10 | 90 days supply | Qty: 90 | Fill #1

## 2020-03-09 MED FILL — VIT D2 1.25 MG (50,000 UNIT: 1.25 MG | 84 days supply | Qty: 24 | Fill #1

## 2020-03-11 MED FILL — LINZESS 145 MCG CAPSULE: 145 | 90 days supply | Qty: 90 | Fill #0

## 2020-03-11 MED FILL — OMEPRAZOLE 20 MG CAP: 20 | 90 days supply | Qty: 90 | Fill #0

## 2020-03-25 ENCOUNTER — Other Ambulatory Visit: Payer: Self-pay | Admitting: Internal Medicine

## 2020-03-25 DIAGNOSIS — L709 Acne, unspecified: Secondary | ICD-10-CM

## 2020-03-25 MED ORDER — DOXYCYCLINE HYCLATE 100 MG PO CAPS: ORAL_CAPSULE | ORAL | 0 refills | Status: DC

## 2020-03-25 MED FILL — DOXYCYCLINE HYCLATE 100 MG: 100 | 90 days supply | Qty: 90 | Fill #0

## 2020-03-25 MED FILL — LEVOTHYROXINE 75 MCG TABLET: 75 | 90 days supply | Qty: 90 | Fill #1

## 2020-04-18 ENCOUNTER — Ambulatory Visit: Payer: 59 | Admitting: Adult Health

## 2020-04-28 ENCOUNTER — Other Ambulatory Visit: Payer: Self-pay

## 2020-05-03 ENCOUNTER — Other Ambulatory Visit: Payer: Self-pay | Admitting: Adult Health

## 2020-05-03 ENCOUNTER — Other Ambulatory Visit (HOSPITAL_COMMUNITY): Payer: Self-pay

## 2020-05-03 MED ORDER — FLUOXETINE HCL 20 MG PO CAPS
ORAL_CAPSULE | ORAL | 1 refills | Status: DC
  Filled 2020-05-03: qty 180, 90d supply, fill #0
  Filled 2020-08-04: qty 180, 90d supply, fill #1

## 2020-05-16 ENCOUNTER — Other Ambulatory Visit (HOSPITAL_COMMUNITY): Payer: Self-pay

## 2020-05-16 ENCOUNTER — Ambulatory Visit (INDEPENDENT_AMBULATORY_CARE_PROVIDER_SITE_OTHER): Payer: 59 | Admitting: Adult Health

## 2020-05-16 ENCOUNTER — Encounter: Payer: Self-pay | Admitting: Adult Health

## 2020-05-16 ENCOUNTER — Other Ambulatory Visit: Payer: Self-pay

## 2020-05-16 VITALS — BP 124/72 | HR 65 | Temp 97.0°F | Ht 61.5 in | Wt 156.0 lb

## 2020-05-16 DIAGNOSIS — F419 Anxiety disorder, unspecified: Secondary | ICD-10-CM

## 2020-05-16 DIAGNOSIS — Z79899 Other long term (current) drug therapy: Secondary | ICD-10-CM | POA: Diagnosis not present

## 2020-05-16 DIAGNOSIS — Z87898 Personal history of other specified conditions: Secondary | ICD-10-CM

## 2020-05-16 DIAGNOSIS — E559 Vitamin D deficiency, unspecified: Secondary | ICD-10-CM

## 2020-05-16 DIAGNOSIS — K760 Fatty (change of) liver, not elsewhere classified: Secondary | ICD-10-CM

## 2020-05-16 DIAGNOSIS — E663 Overweight: Secondary | ICD-10-CM | POA: Diagnosis not present

## 2020-05-16 DIAGNOSIS — E782 Mixed hyperlipidemia: Secondary | ICD-10-CM | POA: Diagnosis not present

## 2020-05-16 DIAGNOSIS — E079 Disorder of thyroid, unspecified: Secondary | ICD-10-CM

## 2020-05-16 DIAGNOSIS — G720 Drug-induced myopathy: Secondary | ICD-10-CM

## 2020-05-16 DIAGNOSIS — T466X5A Adverse effect of antihyperlipidemic and antiarteriosclerotic drugs, initial encounter: Secondary | ICD-10-CM

## 2020-05-16 DIAGNOSIS — L709 Acne, unspecified: Secondary | ICD-10-CM

## 2020-05-16 DIAGNOSIS — Z1211 Encounter for screening for malignant neoplasm of colon: Secondary | ICD-10-CM

## 2020-05-16 MED ORDER — METFORMIN HCL ER 500 MG PO TB24
500.0000 mg | ORAL_TABLET | Freq: Every day | ORAL | 3 refills | Status: DC
  Filled 2020-05-16: qty 90, 90d supply, fill #0
  Filled 2020-08-13: qty 90, 90d supply, fill #1
  Filled 2020-11-18: qty 90, 90d supply, fill #2
  Filled 2021-02-18: qty 90, 90d supply, fill #3

## 2020-05-16 MED ORDER — ALPRAZOLAM 0.25 MG PO TABS
0.2500 mg | ORAL_TABLET | Freq: Two times a day (BID) | ORAL | 0 refills | Status: AC | PRN
  Filled 2020-05-16: qty 60, 30d supply, fill #0

## 2020-05-16 NOTE — Progress Notes (Signed)
Assessment and Plan:   Mixed hyperlipidemia At goal on nexlizet; myalgias/brain fog resolved off of statin With + family history of cardiac disease;  -     COMPLETE METABOLIC PANEL WITH GFR -     Lipid panel  History of Pre-diabetes Restart metformin per patient preference, hx of insulin resistance Discussed disease and risks Discussed diet/exercise, weight management  A1C at goal last visit; check CMP; defer A1C to CPE   Hypothyroidism continue medications the same pending lab results reminded to take on an empty stomach 30-82mins before food.  check TSH level  Vitamin D deficiency Above goal at recent check; did reduce from 50000 twice weekly to once a week continue to recommend supplementation for goal of 60-100 Defer vitamin D level to CPE due to cost of lab;   Anxiety Well managed by current regimen; continue medications; rare use of benzo  Stress management techniques discussed, increase water, good sleep hygiene discussed, increase exercise, and increase veggies.   GERD Well managed on current medications; on PPI due to hiatal hernia, failed taper to H2i Discussed diet, avoiding triggers and other lifestyle changes  Medication management -     CBC with Differential/Platelet -     COMPLETE METABOLIC PANEL WITH GFR  -     Magnesium   Acne/folliculitis She is on doxycycline 100 mg intermittently  Electrolysis   Further disposition pending results of labs. Discussed med's effects and SE's.   Over 30 minutes of exam, counseling, chart review, and critical decision making was performed.   Future Appointments  Date Time Provider [place]  10/21/2020  3:00 PM Liane Comber, NP [facility] None   ------------------------------------------------------------------------------------------------------------------   HPI 60 y.o.female presents for 6 month follow up for hyperlipidemia, hypothyroid, glucose management (hx of prediabetes), vitamin D deficiency and  anxiety.   She has had some joint aching, lumbar back (hx of decompression), neck, bil thumbs, wakes up with pain and stiffness, also worse after sitting for extended period at work. Was following with Dr. Carloyn Manner until recent retirement, manages with heat, tylenol, aleve intermittently.   She has cystic acne and folliculitis secondary to PCOS/hirstutism, gets electrolysis which improves significantly. Also uses doxycycline 100 mg intermittently.   she has a diagnosis of anxiety and is currently on prozac 20 mg daily, xanax 0.25 mg PRN, reports symptoms are well controlled on current regimen. She reports she uses rarely, typically when flying.   she has a diagnosis of GERD which is currently managed by omeprazole 20 mg due to GERD with hiatal hernia, has failed attempt to taper off of PPI.   BMI is Body mass index is 29 kg/m., she has been working on diet, generally healthy choices, admits to not as much activity, sitting more at work, stress with current job, looking for new position.  Wt Readings from Last 3 Encounters:  05/16/20 156 lb (70.8 kg)  12/07/19 156 lb 3.2 oz (70.9 kg)  10/18/19 155 lb 9.6 oz (70.6 kg)   Her blood pressure has been controlled at home, today their BP is BP: 124/72  She does workout. She denies chest pain, shortness of breath, dizziness.   She is on cholesterol medication, had myalgia on statin (inculding low dose low frequency rosuvastatin, vytorin) that are fully resolved on nexlizet.  Her LDL cholesterol is at goal. The cholesterol last visit was:   Lab Results  Component Value Date   CHOL 181 10/18/2019   HDL 52 10/18/2019   LDLCALC 97 10/18/2019   TRIG 219 (  H) 10/18/2019   CHOLHDL 3.5 10/18/2019    She has been working on diet and exercise for history of prediabetes recently normalized A1Cs (intermittently A1Cs up to 5.9% predating 2014), and denies increased appetite, nausea, paresthesia of the feet, polydipsia, polyuria and visual disturbances. Last A1C in  the office was:  Lab Results  Component Value Date   HGBA1C 5.5 10/18/2019   She is on thyroid medication. Her medication was not changed last visit.  Taking 75 mcg daily except 1/2 tab Mon/Fri; takes on empty stomach 30-60 min with water.  Lab Results  Component Value Date   TSH 1.96 12/07/2019   Patient is on Vitamin D supplement, taking once weekly:  Lab Results  Component Value Date   VD25OH 89 10/18/2019       Past Medical History:  Diagnosis Date  . Acne   . Anxiety   . Arthritis   . Cervical polyp   . Chronic back pain   . Depression   . Endometriosis   . GERD (gastroesophageal reflux disease)   . Hyperlipidemia   . Hypothyroidism, adult    hypothyroidism  . PCO (polycystic ovaries)   . Plantar fasciitis   . Pleural effusion, bilateral   . Post-menopausal   . Pre-diabetes      Allergies  Allergen Reactions  . Advicor [Niacin-Lovastatin Er] Swelling    Facial swelling  . Niacin-Lovastatin Er Swelling and Other (See Comments)    Extreme flushing    Current Outpatient Medications on File Prior to Visit  Medication Sig  . acetaminophen (TYLENOL) 500 MG tablet Take 1 tablet (500 mg total) by mouth every 6 (six) hours as needed for mild pain, fever or headache.  . albuterol (VENTOLIN HFA) 108 (90 Base) MCG/ACT inhaler Inhale 2 puffs into the lungs every 6 (six) hours as needed for wheezing or shortness of breath.  . ALPRAZolam (XANAX) 0.25 MG tablet Take 1 tablet (0.25 mg total) by mouth 3 (three) times daily.  . Bempedoic Acid-Ezetimibe 180-10 MG TABS TAKE 1 TABLET BY MOUTH DAILY.  Marland Kitchen Bioflavonoid Products (PERIDRIN-C) [id number] MG TABS Take 2 tablets by mouth daily.   . cetirizine (ZYRTEC) 10 MG tablet Take 10 mg by mouth daily.  Marland Kitchen doxycycline (VIBRAMYCIN) 100 MG capsule TAKE 1 CAPSULE BY MOUTH DAILY WITH FOOD TO PREVENT ACNE  . FLUoxetine (PROZAC) 20 MG capsule TAKE 1 CAPSULE BY MOUTH TWO TIMES DAILY FOR MOOD  . levothyroxine (SYNTHROID) 75 MCG tablet Take 1  tablet daily (except 1/2 tab M/F) on an empty stomach with only water for 30 minutes & no Antacid meds, Calcium or Magnesium for 4 hours & avoid Biotin  . linaclotide (LINZESS) 145 MCG CAPS capsule TAKE 1 CAPSULE BY MOUTH ONCE A DAY FOR CONSTIPATION  . mometasone (NASONEX) 50 MCG/ACT nasal spray Place 2 sprays into the nose daily.  Marland Kitchen omeprazole (PRILOSEC) 20 MG capsule TAKE 1 CAPSULE BY MOUTH ONCE A DAY TO TO PREVENT HEARTBURN AND INDIGESTION  . Vitamin D, Ergocalciferol, (DRISDOL) 1.25 MG (50000 UNIT) CAPS capsule TAKE 1 CAPSULE BY MOUTH ONCE A WEEK   No current facility-administered medications on file prior to visit.    ROS: Review of Systems  Constitutional: Negative for malaise/fatigue and weight loss.  HENT: Negative for hearing loss and tinnitus.   Eyes: Negative for blurred vision and double vision.  Respiratory: Negative for cough, shortness of breath and wheezing.   Cardiovascular: Negative for chest pain, palpitations, orthopnea, claudication, leg swelling and PND.  Gastrointestinal: Negative for  abdominal pain, blood in stool, constipation, diarrhea, heartburn, melena, nausea and vomiting.  Genitourinary: Negative.   Musculoskeletal: Negative for joint pain and myalgias.  Skin: Negative for rash.  Neurological: Negative for dizziness, tingling, sensory change, weakness and headaches.  Endo/Heme/Allergies: Negative for polydipsia.  Psychiatric/Behavioral: Negative for depression, substance abuse and suicidal ideas. The patient is not nervous/anxious and does not have insomnia.   All other systems reviewed and are negative.    Physical Exam:  BP 124/72   Pulse 65   Temp (!) 97 F (36.1 C)   Ht 5' 1.5" (1.562 m)   Wt 156 lb (70.8 kg)   SpO2 97%   BMI 29.00 kg/m   General Appearance: Well nourished, in no apparent distress. Eyes: PERRLA, EOMs, conjunctiva no swelling or erythema Sinuses: No Frontal/maxillary tenderness ENT/Mouth: Ext aud canals clear, TMs without  erythema, bulging. Mask in place; oral exam deferred. Hearing normal.  Neck: Supple, thyroid normal.  Respiratory: Respiratory effort normal, BS equal bilaterally without rales, rhonchi, wheezing or stridor.  Cardio: RRR with no MRGs. Brisk peripheral pulses without edema.  Abdomen: Soft, + BS.  Non tender, no guarding, rebound, hernias, masses. Lymphatics: Non tender without lymphadenopathy.  Musculoskeletal: Full ROM, 5/5 strength, normal gait.  Skin: Warm, dry without rashes, ecchymosis. Cystic lesions to chin resolved.  Neuro: Cranial nerves intact. Normal muscle tone, no cerebellar symptoms. Sensation intact.  Psych: Awake and oriented X 3, normal affect, Insight and Judgment appropriate.     Izora Ribas, NP 4:28 PM [facility]

## 2020-05-16 NOTE — Patient Instructions (Addendum)
Goals    . LDL CALC < 100    . Weight (lb) < 145 lb (65.8 kg)       Can try tart cherry supplement or tumeric with black pepper/bioprene     Antiinflammatory diet  Doctors are learning that one of the best ways to reduce inflammation lies not in the medicine cabinet, but in the refrigerator.  By following an anti-inflammatory diet you can fight off inflammation for good.  What does an anti-inflammatory diet do? Your immune system becomes activated when your body recognizes anything that is foreign--such as an invading microbe, plant pollen, or chemical. This often triggers a process called inflammation. Intermittent bouts of inflammation directed at truly threatening invaders protect your health.  However, sometimes inflammation persists, day in and day out, even when you are not threatened by a foreign invader. That's when inflammation can become your enemy. Many major diseases that plague us--including cancer, heart disease, diabetes, arthritis, depression, and Alzheimer's--have been linked to chronic inflammation.  One of the most powerful tools to combat inflammation comes not from the pharmacy, but from the grocery store.   Protect yourself from the damage of chronic inflammation. Science has proven that chronic, low-grade inflammation can turn into a silent killer that contributes to cardiovascular disease, cancer, type 2 diabetes and other conditions.   Choose the right anti-inflammatory foods, and you may be able to reduce your risk of illness. Consistently pick the wrong ones, and you could accelerate the inflammatory disease process.     Foods that cause inflammation Try to avoid or limit these foods as much as possible:   refined carbohydrates, such as white bread and pastries  [place] fries and other fried foods  soda and other sugar-sweetened beverages  red meat (burgers, steaks) and processed meat (hot dogs, sausage)  margarine, shortening, and lard  The health  risks of inflammatory foods Not surprisingly, the same foods on an inflammation diet are generally considered bad for our health, including sodas and refined carbohydrates, as well as red meat and processed meats.  "Some of the foods that have been associated with an increased risk for chronic diseases such as type 2 diabetes and heart disease are also associated with excess inflammation," Dr. Melanee Spry says. "It's not surprising, since inflammation is an important underlying mechanism for the development of these diseases."  Unhealthy foods also contribute to weight gain, which is itself a risk factor for inflammation. Yet in several studies, even after researchers took obesity into account, the link between foods and inflammation remained, which suggests weight gain isn't the sole driver. "Some of the food components or ingredients may have independent effects on inflammation over and above increased caloric intake," Dr. Melanee Spry says.  Anti-inflammatory foods An anti-inflammatory diet should include these foods:   tomatoes  olive oil  green leafy vegetables, such as spinach, kale, and collards  nuts like almonds and walnuts  fatty fish like salmon, mackerel, tuna, and sardines  fruits such as strawberries, blueberries, cherries, and oranges  Benefits of anti-inflammatory foods On the flip side are beverages and foods that reduce inflammation, in particular fruits and vegetables such as blueberries, apples, and leafy greens that are high in natural antioxidants and polyphenols--protective compounds found in plants.  Studies have also associated nuts with reduced markers of inflammation and a lower risk of cardiovascular disease and diabetes. Coffee, which contains polyphenols and other anti-inflammatory compounds, may protect against inflammation, as well.  Anti-inflammatory diet To reduce levels of inflammation, aim for an  overall healthy diet. If you're looking for an eating plan that closely  follows the tenets of anti-inflammatory eating, consider the Mediterranean diet, which is high in fruits, vegetables, nuts, whole grains, fish, and healthy oils.  In addition to lowering inflammation, a more natural, less processed diet can have noticeable effects on your physical and emotional health and overall quality of life.   [email or web address]://[email or web address].fi

## 2020-05-17 ENCOUNTER — Other Ambulatory Visit (HOSPITAL_COMMUNITY): Payer: Self-pay

## 2020-05-17 LAB — CBC WITH DIFFERENTIAL/PLATELET
Absolute Monocytes: 414 cells/uL (ref 200–950)
Basophils Absolute: 40 cells/uL (ref 0–200)
Basophils Relative: 0.9 %
Eosinophils Absolute: 70 cells/uL (ref 15–500)
Eosinophils Relative: 1.6 %
HCT: 38.6 % (ref 35.0–45.0)
Hemoglobin: 13.1 g/dL (ref 11.7–15.5)
Lymphs Abs: 1452 cells/uL (ref 850–3900)
MCH: 32 pg (ref 27.0–33.0)
MCHC: 33.9 g/dL (ref 32.0–36.0)
MCV: 94.4 fL (ref 80.0–100.0)
MPV: 10 fL (ref 7.5–12.5)
Monocytes Relative: 9.4 %
Neutro Abs: 2424 cells/uL (ref 1500–7800)
Neutrophils Relative %: 55.1 %
Platelets: 322 10*3/uL (ref 140–400)
RBC: 4.09 10*6/uL (ref 3.80–5.10)
RDW: 12.2 % (ref 11.0–15.0)
Total Lymphocyte: 33 %
WBC: 4.4 10*3/uL (ref 3.8–10.8)

## 2020-05-17 LAB — COMPLETE METABOLIC PANEL WITH GFR
AG Ratio: 2.3 (calc) (ref 1.0–2.5)
ALT: 33 U/L — ABNORMAL HIGH (ref 6–29)
AST: 34 U/L (ref 10–35)
Albumin: 4.6 g/dL (ref 3.6–5.1)
Alkaline phosphatase (APISO): 53 U/L (ref 37–153)
BUN: 11 mg/dL (ref 7–25)
CO2: 30 mmol/L (ref 20–32)
Calcium: 9.8 mg/dL (ref 8.6–10.4)
Chloride: 97 mmol/L — ABNORMAL LOW (ref 98–110)
Creat: 0.75 mg/dL (ref 0.50–0.99)
GFR, Est African American: 100 mL/min/{1.73_m2} (ref >=60)
GFR, Est Non African American: 87 mL/min/{1.73_m2} (ref >=60)
Globulin: 2 g/dL (calc) (ref 1.9–3.7)
Glucose, Bld: 84 mg/dL (ref 65–99)
Potassium: 3.9 mmol/L (ref 3.5–5.3)
Sodium: 136 mmol/L (ref 135–146)
Total Bilirubin: 0.5 mg/dL (ref 0.2–1.2)
Total Protein: 6.6 g/dL (ref 6.1–8.1)

## 2020-05-17 LAB — LIPID PANEL
Cholesterol: 186 mg/dL (ref <200)
HDL: 59 mg/dL (ref >=50)
LDL Cholesterol (Calc): 98 mg/dL (calc)
Non-HDL Cholesterol (Calc): 127 mg/dL (calc) (ref <130)
Total CHOL/HDL Ratio: 3.2 (calc) (ref <5.0)
Triglycerides: 203 mg/dL — ABNORMAL HIGH (ref <150)

## 2020-05-17 LAB — MAGNESIUM: Magnesium: 2.1 mg/dL (ref 1.5–2.5)

## 2020-05-17 LAB — TSH: TSH: 0.61 mIU/L (ref 0.40–4.50)

## 2020-05-18 ENCOUNTER — Other Ambulatory Visit (HOSPITAL_COMMUNITY): Payer: Self-pay

## 2020-06-05 ENCOUNTER — Encounter: Payer: Self-pay | Admitting: Internal Medicine

## 2020-06-07 ENCOUNTER — Other Ambulatory Visit: Payer: Self-pay | Admitting: Internal Medicine

## 2020-06-07 ENCOUNTER — Other Ambulatory Visit (HOSPITAL_COMMUNITY): Payer: Self-pay

## 2020-06-07 DIAGNOSIS — K219 Gastro-esophageal reflux disease without esophagitis: Secondary | ICD-10-CM

## 2020-06-07 MED ORDER — OMEPRAZOLE 20 MG PO CPDR
DELAYED_RELEASE_CAPSULE | ORAL | 3 refills | Status: DC
  Filled 2020-06-07: qty 90, 90d supply, fill #0

## 2020-07-18 ENCOUNTER — Other Ambulatory Visit: Payer: Self-pay | Admitting: Internal Medicine

## 2020-07-18 ENCOUNTER — Encounter: Payer: Self-pay | Admitting: Physician Assistant

## 2020-07-18 ENCOUNTER — Other Ambulatory Visit (HOSPITAL_BASED_OUTPATIENT_CLINIC_OR_DEPARTMENT_OTHER): Payer: Self-pay

## 2020-07-18 ENCOUNTER — Other Ambulatory Visit (HOSPITAL_COMMUNITY): Payer: Self-pay

## 2020-07-18 DIAGNOSIS — E039 Hypothyroidism, unspecified: Secondary | ICD-10-CM

## 2020-07-18 DIAGNOSIS — Z1231 Encounter for screening mammogram for malignant neoplasm of breast: Secondary | ICD-10-CM

## 2020-07-18 MED ORDER — LEVOTHYROXINE SODIUM 75 MCG PO TABS
ORAL_TABLET | ORAL | 3 refills | Status: DC
  Filled 2020-07-18: qty 90, 90d supply, fill #0
  Filled 2020-11-11: qty 90, 90d supply, fill #1
  Filled 2021-02-11: qty 90, 90d supply, fill #2

## 2020-07-18 MED FILL — Bempedoic Acid-Ezetimibe Tab 180-10 MG: ORAL | 90 days supply | Qty: 90 | Fill #0 | Status: CN

## 2020-07-19 ENCOUNTER — Encounter: Payer: Self-pay | Admitting: Adult Health

## 2020-07-19 ENCOUNTER — Other Ambulatory Visit (HOSPITAL_COMMUNITY): Payer: Self-pay

## 2020-07-20 ENCOUNTER — Other Ambulatory Visit (HOSPITAL_COMMUNITY): Payer: Self-pay

## 2020-07-20 MED FILL — Bempedoic Acid-Ezetimibe Tab 180-10 MG: ORAL | 90 days supply | Qty: 90 | Fill #0 | Status: CN

## 2020-08-02 ENCOUNTER — Other Ambulatory Visit (HOSPITAL_COMMUNITY): Payer: Self-pay

## 2020-08-02 MED FILL — Bempedoic Acid-Ezetimibe Tab 180-10 MG: ORAL | 90 days supply | Qty: 90 | Fill #0 | Status: AC

## 2020-08-03 ENCOUNTER — Other Ambulatory Visit: Payer: Self-pay | Admitting: Internal Medicine

## 2020-08-03 DIAGNOSIS — L709 Acne, unspecified: Secondary | ICD-10-CM

## 2020-08-03 MED ORDER — DOXYCYCLINE HYCLATE 100 MG PO CAPS
ORAL_CAPSULE | ORAL | 0 refills | Status: DC
  Filled 2020-08-03: qty 90, 90d supply, fill #0

## 2020-08-05 ENCOUNTER — Other Ambulatory Visit (HOSPITAL_COMMUNITY): Payer: Self-pay

## 2020-08-09 ENCOUNTER — Other Ambulatory Visit (HOSPITAL_COMMUNITY): Payer: Self-pay

## 2020-08-12 ENCOUNTER — Other Ambulatory Visit (HOSPITAL_COMMUNITY): Payer: Self-pay

## 2020-08-13 ENCOUNTER — Other Ambulatory Visit: Payer: Self-pay

## 2020-08-14 ENCOUNTER — Other Ambulatory Visit (HOSPITAL_COMMUNITY): Payer: Self-pay

## 2020-08-19 ENCOUNTER — Ambulatory Visit
Admission: RE | Admit: 2020-08-19 | Discharge: 2020-08-19 | Disposition: A | Discharge status: Home / Self Care | Payer: 59 | Source: Ambulatory Visit | Attending: Internal Medicine | Admitting: Internal Medicine

## 2020-08-19 ENCOUNTER — Ambulatory Visit: Payer: 59 | Admitting: Physician Assistant

## 2020-08-19 ENCOUNTER — Other Ambulatory Visit: Payer: Self-pay

## 2020-08-19 ENCOUNTER — Other Ambulatory Visit (HOSPITAL_COMMUNITY): Payer: Self-pay

## 2020-08-19 ENCOUNTER — Encounter: Payer: Self-pay | Admitting: Physician Assistant

## 2020-08-19 VITALS — HR 62 | Ht 61.5 in | Wt 155.6 lb

## 2020-08-19 DIAGNOSIS — Z1231 Encounter for screening mammogram for malignant neoplasm of breast: Secondary | ICD-10-CM

## 2020-08-19 DIAGNOSIS — K219 Gastro-esophageal reflux disease without esophagitis: Secondary | ICD-10-CM | POA: Diagnosis not present

## 2020-08-19 DIAGNOSIS — Z1211 Encounter for screening for malignant neoplasm of colon: Secondary | ICD-10-CM | POA: Diagnosis not present

## 2020-08-19 DIAGNOSIS — R1319 Other dysphagia: Secondary | ICD-10-CM | POA: Diagnosis not present

## 2020-08-19 MED ORDER — OMEPRAZOLE 20 MG PO CPDR
20.0000 mg | DELAYED_RELEASE_CAPSULE | Freq: Every day | ORAL | 3 refills | Status: DC
  Filled 2020-08-19: qty 90, 90d supply, fill #0

## 2020-08-19 MED ORDER — SUPREP BOWEL PREP KIT 17.5-3.13-1.6 GM/177ML PO SOLN
ORAL | 0 refills | Status: DC
  Filled 2020-08-19: qty 354, 1d supply, fill #0

## 2020-08-19 NOTE — Patient Instructions (Addendum)
It was my pleasure to provide care to you today. Based on our discussion, I am providing you with my recommendations below:  RECOMMENDATION(S):   We have sent the following medication(s) to your pharmacy:  Omeprazole - please take 1 tablet by mouth daily  NOTE: If your medication(s) requires a PRIOR AUTHORIZATION, we will receive notification from your pharmacy. Once received, the process to submit for approval may take up to 7-10 business days. You will be contacted about any denials we have received from your insurance company as well as alternatives recommended by your provider.  You have been scheduled for an endoscopy and a colonoscopy. Please follow the written instructions given to you at your visit today.  PREP:   Please pick up your prep supplies at the pharmacy within the next 1-3 days.  INHALERS:   If you use inhalers (even only as needed), please bring them with you on the day of your procedure.  MEDICATIONS TO HOLD:  Please refer to your Prep Instructions regarding holding your METFORMIN.  COLONOSCOPY TIPS:  To reduce nausea and dehydration, stay well hydrated for 3-4 days prior to the exam.  To prevent skin/hemorrhoid irritation - prior to wiping, put A&Dointment or vaseline on the toilet paper. Keep a towel or pad on the bed.  BEFORE STARTING YOUR PREP, drink  64oz of clear liquids in the morning. This will help to flush the colon and will ensure you are well hydrated!!!!  NOTE - This is in addition to the fluids required for to complete your prep. Use of a flavored hard candy, such as grape Anise Salvo, can counteract some of the flavor of the prep and may prevent some nausea.   FOLLOW UP:  After your procedure, you will receive a call from my office staff regarding my recommendation for follow up.  BMI:  If you are age 32 or younger, your body mass index should be between 19-25. Your There is no height or weight on file to calculate BMI. If this is out of the  aformentioned range listed, please consider follow up with your Primary Care Provider.   MY CHART:  The [place] [facility] providers would like to encourage you to use [facility] to communicate with providers for non-urgent requests or questions.  Due to long hold times on the telephone, sending your provider a message by [facility] may be a faster and more efficient way to get a response.  Please allow 48 business hours for a response.  Please remember that this is for non-urgent requests.   Thank you for trusting me with your gastrointestinal care!    Ellouise Newer, [place]

## 2020-08-19 NOTE — Progress Notes (Signed)
Reviewed and agree with management plan.  Selso Mannor T. Kaimani Clayson, MD FACG 

## 2020-08-19 NOTE — Progress Notes (Signed)
Chief Complaint: Dysphagia, screening for colorectal cancer  HPI:    Alison Alvarez is a 60 year old Caucasian female with a past medical history as listed below including reflux and multiple others, known to Dr. Fuller Plan, who was referred to me by Unk Pinto, MD for a complaint of dysphagia and screening for colorectal cancer.    08/21/2010 colonoscopy with 3 5 mm polyps in the rectum.  Pathology showed hyperplastic polyps and repeat was recommended 10 years.    12/14/2017 CT of the abdomen pelvis with contrast with trace bilateral pleural effusions with adjacent atelectasis, mild inhomogenous slightly striated enhancement of both kidneys question of pyelonephritis, 13 mm simple cyst of the upper pole the left kidney.  Small periumbilical fat-containing hernia.     Today, the patient presents to clinic and explains that over the past 2 months she has noticed some difficulty when swallowing certain foods.  Sometimes meats and other times crackers can seem to get stuck on their way down and will result in a "coughing fit", this has happened on multiple occasions recently.  Also discusses a chronic history of reflux for which she takes Omeprazole 20 mg daily.  If she forgets a dose or it is getting close to when she needs it again she will occasionally have some breakthrough reflux symptoms.  No prior EGD.  She is requesting one now.    Also discusses some variance in bowel habits noting that very occasionally she will have urgent stools, typically when leaving work.  She tells me that she feels like she might have irritable bowel syndrome.    Denies fever, chills, weight loss, change in bowel habits, blood in her stool or symptoms that awaken her from sleep.  Past Medical History:  Diagnosis Date   Acne    Anxiety    Arthritis    Cervical polyp    Chronic back pain    Depression    Endometriosis    GERD (gastroesophageal reflux disease)    History of colon polyps 2012   Hyperlipidemia     Hypothyroidism, adult    hypothyroidism   PCO (polycystic ovaries)    Plantar fasciitis    Pleural effusion, bilateral    Post-menopausal    Pre-diabetes     Past Surgical History:  Procedure Laterality Date   [place]   at age 76   BREAST EXCISIONAL BIOPSY Left    DIAGNOSTIC LAPAROSCOPY     SYNOVIAL CYST EXCISION  2010   l5, s1 left hip   TONSILLECTOMY     wisom teeth  1984    Current Outpatient Medications  Medication Sig Dispense Refill   acetaminophen (TYLENOL) 500 MG tablet Take 1 tablet (500 mg total) by mouth every 6 (six) hours as needed for mild pain, fever or headache. 30 tablet    albuterol (VENTOLIN HFA) 108 (90 Base) MCG/ACT inhaler Inhale 2 puffs into the lungs every 6 (six) hours as needed for wheezing or shortness of breath. 1 Inhaler 2   ALPRAZolam (XANAX) 0.25 MG tablet Take 1 tablet (0.25 mg total) by mouth 2 (two) times daily as needed for anxiety. 60 tablet 0   Bempedoic Acid-Ezetimibe 180-10 MG TABS TAKE 1 TABLET BY MOUTH DAILY. 90 tablet 3   Bioflavonoid Products (PERIDRIN-C) 200-50-150 MG TABS Take 2 tablets by mouth daily.      cetirizine (ZYRTEC) 10 MG tablet Take 10 mg by mouth daily.     doxycycline (VIBRAMYCIN) 100 MG capsule Take 1 capsule by  mouth daily with food for acne 90 capsule 0   FLUoxetine (PROZAC) 20 MG capsule TAKE 1 CAPSULE BY MOUTH TWO TIMES DAILY FOR MOOD 180 capsule 1   levothyroxine (SYNTHROID) 75 MCG tablet TAKE 1 TABLET DAILY ON AN EMPTY STOMACH WITH ONLY WATER FOR 30 MINUTES. NO ANTACID MEDS, CALCIUM OR MAGNESIUM FOR 4 HOURS & AVOID BIOTIN 90 tablet 3   linaclotide (LINZESS) 145 MCG CAPS capsule TAKE 1 CAPSULE BY MOUTH ONCE A DAY FOR CONSTIPATION 90 capsule 3   metFORMIN (GLUCOPHAGE XR) 500 MG 24 hr tablet Take 1 tablet (500 mg total) by mouth daily with breakfast. For insulin resistance and weight. 90 tablet 3   mometasone (NASONEX) 50 MCG/ACT nasal spray Place 2 sprays into the nose daily.     omeprazole  (PRILOSEC) 20 MG capsule Take  1 capsule by mouth once daily to prevent heartburn & indigestion 90 capsule 3   Vitamin D, Ergocalciferol, (DRISDOL) 1.25 MG (50000 UNIT) CAPS capsule TAKE 1 CAPSULE BY MOUTH ONCE A WEEK 12 capsule 1   No current facility-administered medications for this visit.    Allergies as of 08/19/2020 - Review Complete 08/19/2020  Allergen Reaction Noted   Advicor [niacin-lovastatin er] Swelling 01/11/2013   Niacin-lovastatin er Swelling and Other (See Comments) 08/07/2010   Rosuvastatin Other (See Comments) 07/19/2020    Family History  Problem Relation Age of Onset   Dementia Mother    Osteoporosis Mother    Heart disease Father    Parkinson's disease Father    Stroke Father    Diabetes Father    Hyperlipidemia Father    Hypertension Father    Endometrial cancer Sister    Hypertension Brother    Hyperlipidemia Brother    Cancer Maternal Grandfather        brain   Cancer Paternal Aunt        breast   Breast cancer Paternal Aunt    Colon cancer Neg Hx    Esophageal cancer Neg Hx     Social History   Socioeconomic History   Marital status: Divorced    Spouse name: Not on file   Number of children: Not on file   Years of education: Not on file   Highest education level: Not on file  Occupational History   Not on file  Tobacco Use   Smoking status: Never   Smokeless tobacco: Never  Vaping Use   Vaping Use: Never used  Substance and Sexual Activity   Alcohol use: Yes    Comment: occasional alcohol intake   Drug use: No   Sexual activity: Not Currently  Other Topics Concern   Not on file  Social History Narrative   Not on file   Social Determinants of Health   Financial Resource Strain: Not on file  Food Insecurity: Not on file  Transportation Needs: Not on file  Physical Activity: Not on file  Stress: Not on file  Social Connections: Not on file  Intimate Partner Violence: Not on file    Review of Systems:    Constitutional: No  weight loss, fever or chills Skin: No rash  Cardiovascular: No chest pain Respiratory: No SOB  Gastrointestinal: See HPI and otherwise negative Genitourinary: No dysuria Neurological: No headache, dizziness or syncope Musculoskeletal: No new muscle or joint pain Hematologic: No bleeding  Psychiatric: No history of depression or anxiety   Physical Exam:  Vital signs: Pulse 62   Ht 5' 1.5" (1.562 m)   Wt 155 lb 9.6 oz (  70.6 kg)   SpO2 98%   BMI 28.92 kg/m    Constitutional:   Pleasant Caucasian female appears to be in NAD, Well developed, Well nourished, alert and cooperative Head:  Normocephalic and atraumatic. Eyes:   PEERL, EOMI. No icterus. Conjunctiva pink. Ears:  Normal auditory acuity. Neck:  Supple Throat: Oral cavity and pharynx without inflammation, swelling or lesion.  Respiratory: Respirations even and unlabored. Lungs clear to auscultation bilaterally.   No wheezes, crackles, or rhonchi.  Cardiovascular: Normal S1, S2. No MRG. Regular rate and rhythm. No peripheral edema, cyanosis or pallor.  Gastrointestinal:  Soft, nondistended, nontender. No rebound or guarding. Normal bowel sounds. No appreciable masses or hepatomegaly. Rectal:  Not performed.  Msk:  Symmetrical without gross deformities. Without edema, no deformity or joint abnormality.  Neurologic:  Alert and  oriented x4;  grossly normal neurologically.  Skin:   Dry and intact without significant lesions or rashes. Psychiatric: Demonstrates good judgement and reason without abnormal affect or behaviors.  RELEVANT LABS AND IMAGING: CBC    Component Value Date/Time   WBC 4.4 05/16/2020 1652   RBC 4.09 05/16/2020 1652   HGB 13.1 05/16/2020 1652   HGB 10.7 (L) 12/28/2017 1459   HCT 38.6 05/16/2020 1652   PLT 322 05/16/2020 1652   PLT 527 (H) 12/28/2017 1459   MCV 94.4 05/16/2020 1652   MCH 32.0 05/16/2020 1652   MCHC 33.9 05/16/2020 1652   RDW 12.2 05/16/2020 1652   LYMPHSABS 1,452 05/16/2020 1652    MONOABS 0.3 12/28/2017 1459   EOSABS 70 05/16/2020 1652   BASOSABS 40 05/16/2020 1652    CMP     Component Value Date/Time   NA 136 05/16/2020 1652   K 3.9 05/16/2020 1652   CL 97 (L) 05/16/2020 1652   CO2 30 05/16/2020 1652   GLUCOSE 84 05/16/2020 1652   BUN 11 05/16/2020 1652   CREATININE 0.75 05/16/2020 1652   CALCIUM 9.8 05/16/2020 1652   PROT 6.6 05/16/2020 1652   ALBUMIN 3.6 12/28/2017 1459   AST 34 05/16/2020 1652   ALT 33 (H) 05/16/2020 1652   ALKPHOS 117 12/28/2017 1459   BILITOT 0.5 05/16/2020 1652   GFRNONAA 87 05/16/2020 1652   GFRAA 100 05/16/2020 1652    Assessment: 1.  GERD: For years, typically controlled on Omeprazole 20 mg daily; consider underlying gastritis 2.  Dysphagia: Some difficulty when eating crackers and/or meats over the past few months, history of reflux as above; consider esophageal stricture versus esophagitis versus dysmotility 3.  Screening for colorectal cancer: Last colonoscopy in 2012 with hyperplastic polyps and repeat recommended in 10 years  Plan: 1.  Scheduled patient for a diagnostic EGD with dilation and a screening colonoscopy in the [place] with Dr. Fuller Plan.  Did provide the patient a detailed list of risks for the procedures and she agrees to proceed. 2.  Reviewed anti-dysphagia measures including taking small bites, chewing well and avoiding distraction while eating. 3.  Continue Omeprazole 20 mg daily, 30-60 minutes before breakfast. 4.  Patient to follow in clinic per recommendations from Dr. Fuller Plan after time of procedures.  Ellouise Newer, PA-C [facility] 08/19/2020, 9:39 AM  Cc: Unk Pinto, MD

## 2020-09-06 ENCOUNTER — Encounter: Payer: Self-pay | Admitting: Gastroenterology

## 2020-09-12 ENCOUNTER — Ambulatory Visit: Payer: 59

## 2020-09-16 ENCOUNTER — Other Ambulatory Visit (HOSPITAL_COMMUNITY): Payer: Self-pay

## 2020-09-16 ENCOUNTER — Other Ambulatory Visit: Payer: Self-pay | Admitting: Adult Health

## 2020-09-16 ENCOUNTER — Other Ambulatory Visit (HOSPITAL_BASED_OUTPATIENT_CLINIC_OR_DEPARTMENT_OTHER): Payer: Self-pay

## 2020-09-16 MED ORDER — VITAMIN D (ERGOCALCIFEROL) 1.25 MG (50000 UNIT) PO CAPS
ORAL_CAPSULE | ORAL | 1 refills | Status: DC
  Filled 2020-09-16: qty 24, fill #0
  Filled 2020-09-16: qty 12, 84d supply, fill #0

## 2020-10-01 ENCOUNTER — Other Ambulatory Visit (HOSPITAL_COMMUNITY): Payer: Self-pay

## 2020-10-01 MED FILL — Linaclotide Cap 145 MCG: ORAL | 90 days supply | Qty: 90 | Fill #0 | Status: AC

## 2020-10-15 ENCOUNTER — Encounter: Payer: Self-pay | Admitting: Gastroenterology

## 2020-10-15 ENCOUNTER — Other Ambulatory Visit: Payer: Self-pay

## 2020-10-15 ENCOUNTER — Ambulatory Visit (AMBULATORY_SURGERY_CENTER): Payer: 59 | Admitting: Gastroenterology

## 2020-10-15 ENCOUNTER — Other Ambulatory Visit (HOSPITAL_COMMUNITY): Payer: Self-pay

## 2020-10-15 VITALS — BP 117/68 | HR 58 | Temp 98.4°F | Resp 14 | Ht 61.0 in | Wt 155.0 lb

## 2020-10-15 DIAGNOSIS — K219 Gastro-esophageal reflux disease without esophagitis: Secondary | ICD-10-CM | POA: Diagnosis not present

## 2020-10-15 DIAGNOSIS — K449 Diaphragmatic hernia without obstruction or gangrene: Secondary | ICD-10-CM | POA: Diagnosis not present

## 2020-10-15 DIAGNOSIS — K2100 Gastro-esophageal reflux disease with esophagitis, without bleeding: Secondary | ICD-10-CM | POA: Diagnosis not present

## 2020-10-15 DIAGNOSIS — D122 Benign neoplasm of ascending colon: Secondary | ICD-10-CM | POA: Diagnosis not present

## 2020-10-15 DIAGNOSIS — Z1211 Encounter for screening for malignant neoplasm of colon: Secondary | ICD-10-CM | POA: Diagnosis not present

## 2020-10-15 DIAGNOSIS — E669 Obesity, unspecified: Secondary | ICD-10-CM | POA: Diagnosis not present

## 2020-10-15 DIAGNOSIS — R1310 Dysphagia, unspecified: Secondary | ICD-10-CM | POA: Diagnosis not present

## 2020-10-15 DIAGNOSIS — K317 Polyp of stomach and duodenum: Secondary | ICD-10-CM | POA: Diagnosis not present

## 2020-10-15 DIAGNOSIS — D125 Benign neoplasm of sigmoid colon: Secondary | ICD-10-CM

## 2020-10-15 DIAGNOSIS — E119 Type 2 diabetes mellitus without complications: Secondary | ICD-10-CM | POA: Diagnosis not present

## 2020-10-15 MED ORDER — OMEPRAZOLE 40 MG PO CPDR
40.0000 mg | DELAYED_RELEASE_CAPSULE | Freq: Two times a day (BID) | ORAL | 11 refills | Status: DC
  Filled 2020-10-15: qty 60, 30d supply, fill #0
  Filled 2020-11-23: qty 60, 30d supply, fill #1
  Filled 2021-01-06: qty 60, 30d supply, fill #2
  Filled 2021-02-11: qty 60, 30d supply, fill #3
  Filled 2021-04-13: qty 60, 30d supply, fill #4
  Filled 2021-06-09: qty 60, 30d supply, fill #5
  Filled 2021-08-03: qty 60, 30d supply, fill #6
  Filled 2021-09-30: qty 60, 30d supply, fill #7

## 2020-10-15 MED ORDER — SODIUM CHLORIDE 0.9 % IV SOLN: 500.0000 mL | Freq: Once | INTRAVENOUS | Status: DC

## 2020-10-15 NOTE — Progress Notes (Signed)
Called to room to assist during endoscopic procedure.  Patient ID and intended procedure confirmed with present staff. Received instructions for my participation in the procedure from the performing physician.  

## 2020-10-15 NOTE — Patient Instructions (Addendum)
Handouts were given to your care partner on polyps, GERD, a hiatal hernia, the esophageal dilatation diet to follow the rest of today. Your blood sugar was 84  in the recovery room. Increase OMEPRAZOLE 40 mg to 2x daily.  Best to take on an empty stomach. You may resume your current medications today. Please follow the esophageal dilatation diet the rest of today.  Handout was provided. Await biopsy results.  May take 1-3 weeks to receive pathology results. The office will call you with a follow-up appointment with Daylene Katayama for 2 months. Please call if any questions or concerns.      YOU HAD AN ENDOSCOPIC PROCEDURE TODAY AT [facility]:   Refer to the procedure report that was given to you for any specific questions about what was found during the examination.  If the procedure report does not answer your questions, please call your gastroenterologist to clarify.  If you requested that your care partner not be given the details of your procedure findings, then the procedure report has been included in a sealed envelope for you to review at your convenience later.  YOU SHOULD EXPECT: Some feelings of bloating in the abdomen. Passage of more gas than usual.  Walking can help get rid of the air that was put into your GI tract during the procedure and reduce the bloating. If you had a lower endoscopy (such as a colonoscopy or flexible sigmoidoscopy) you may notice spotting of blood in your stool or on the toilet paper. If you underwent a bowel prep for your procedure, you may not have a normal bowel movement for a few days.  Please Note:  You might notice some irritation and congestion in your nose or some drainage.  This is from the oxygen used during your procedure.  There is no need for concern and it should clear up in a day or so.  SYMPTOMS TO REPORT IMMEDIATELY:  Following lower endoscopy (colonoscopy or flexible sigmoidoscopy):  Excessive amounts of blood in the  stool  Significant tenderness or worsening of abdominal pains  Swelling of the abdomen that is new, acute  Fever of 100F or higher  Following upper endoscopy (EGD)  Vomiting of blood or coffee ground material  New chest pain or pain under the shoulder blades  Painful or persistently difficult swallowing  New shortness of breath  Fever of 100F or higher  Black, tarry-looking stools  For urgent or emergent issues, a gastroenterologist can be reached at any hour by calling [phone]. Do not use MyChart messaging for urgent concerns.    DIET:  Please follow the esophageal dilatation diet the rest of today.  Handout was provided.  Drink plenty of fluids but you should avoid alcoholic beverages for 24 hours.  ACTIVITY:  You should plan to take it easy for the rest of today and you should NOT DRIVE or use heavy machinery until tomorrow (because of the sedation medicines used during the test).    FOLLOW UP: Our staff will call the number listed on your records 48-72 hours following your procedure to check on you and address any questions or concerns that you may have regarding the information given to you following your procedure. If we do not reach you, we will leave a message.  We will attempt to reach you two times.  During this call, we will ask if you have developed any symptoms of COVID 19. If you develop any symptoms (ie: fever, flu-like symptoms, shortness of breath,  cough etc.) before then, please call [phone].  If you test positive for Covid 19 in the 2 weeks post procedure, please call and report this information to [place].    If any biopsies were taken you will be contacted by phone or by letter within the next 1-3 weeks.  Please call us at [phone] if you have not heard about the biopsies in 3 weeks.    SIGNATURES/CONFIDENTIALITY: You and/or your care partner have signed paperwork which will be entered into your electronic medical record.  These signatures attest to  the fact that that the information above on your After Visit Summary has been reviewed and is understood.  Full responsibility of the confidentiality of this discharge information lies with you and/or your care-partner.

## 2020-10-15 NOTE — Op Note (Signed)
[place] Patient Name: Alison Alvarez Procedure Date: 10/15/2020 2:49 PM MRN: [id number] Endoscopist: Ladene Artist , MD Age: 60 Referring MD:  Date of Birth: 1960-09-01 Gender: Female Account #: [id number] Procedure:                Upper GI endoscopy Indications:              Dysphagia, Gastroesophageal reflux disease Medicines:                Monitored Anesthesia Care Procedure:                Pre-Anesthesia Assessment:                           - Prior to the procedure, a History and Physical                            was performed, and patient medications and                            allergies were reviewed. The patient's tolerance of                            previous anesthesia was also reviewed. The risks                            and benefits of the procedure and the sedation                            options and risks were discussed with the patient.                            All questions were answered, and informed consent                            was obtained. Prior Anticoagulants: The patient has                            taken no previous anticoagulant or antiplatelet                            agents. ASA Grade Assessment: II - A patient with                            mild systemic disease. After reviewing the risks                            and benefits, the patient was deemed in                            satisfactory condition to undergo the procedure.                           After obtaining informed consent, the endoscope was  passed under direct vision. Throughout the                            procedure, the patient's blood pressure, pulse, and                            oxygen saturations were monitored continuously. The                            Endoscope was introduced through the mouth, and                            advanced to the second part of duodenum. The upper                            GI  endoscopy was accomplished without difficulty.                            The patient tolerated the procedure well. Scope In: Scope Out: Findings:                 LA Grade B (one or more mucosal breaks greater than                            5 mm, not extending between the tops of two mucosal                            folds) esophagitis with no bleeding was found at                            the gastroesophageal junction.                           No endoscopic abnormality was evident in the                            esophagus to explain the patient's complaint of                            dysphagia. It was decided, however, to proceed with                            dilation of the entire esophagus. A guidewire was                            placed and the scope was withdrawn. Dilation was                            performed with a Savary dilator with no resistance                            at 17 mm.  Multiple medium sessile polyps with no bleeding and                            no stigmata of recent bleeding were found in the                            gastric fundus and in the gastric body. Several of                            the larger polyps were removed with a cold snare.                            Resection and retrieval were complete.                           A small hiatal hernia was present.                           The exam of the stomach was otherwise normal.                           The duodenal bulb and second portion of the                            duodenum were normal. Complications:            No immediate complications. Estimated Blood Loss:     Estimated blood loss was minimal. Impression:               - LA Grade B reflux esophagitis with no bleeding.                           - No endoscopic esophageal abnormality to explain                            patient's dysphagia. Esophagus dilated.                           - Multiple  gastric polyps. Several were resected                            and retrieved.                           - Small hiatal hernia.                           - Normal duodenal bulb and second portion of the                            duodenum. Recommendation:           - Patient has a contact number available for                            emergencies. The signs and symptoms  of potential                            delayed complications were discussed with the                            patient. Return to normal activities tomorrow.                            Written discharge instructions were provided to the                            patient.                           - Clear liquid diet for 2 hours, then advance as                            tolerated to soft diet today.                           - Resume prior diet tomrrow.                           - Antireflux measures long term                           - Continue present medications.                           - Await pathology results.                           - Change Prilosec (omeprazole) 40 mg PO BID,                            refills for 1 year.                           - Return to GI office in 2 months with Ellouise Newer PA-C or me. Ladene Artist, MD 10/15/2020 3:37:45 PM This report has been signed electronically.

## 2020-10-15 NOTE — Progress Notes (Signed)
Pt's states no medical or surgical changes since previsit or office visit. VS assessed by D.T 

## 2020-10-15 NOTE — Progress Notes (Signed)
Report to PACU, RN, vss, BBS= Clear.  

## 2020-10-15 NOTE — Progress Notes (Signed)
History & Physical  Primary Care Physician:  Unk Pinto, MD Primary Gastroenterologist: Jerilynn Mages. Fuller Plan, MD  CHIEF COMPLAINT:  CRC screening, GERD, dysphagia  HPI: Alison Alvarez is a 60 y.o. female with chronic GERD who notes worsening solid and liquid choking and dysphagia.  She presents for EGD with possible dilation and colonoscopy.   Past Medical History:  Diagnosis Date   Acne    Anxiety    Arthritis    Cervical polyp    Chronic back pain    Depression    Endometriosis    GERD (gastroesophageal reflux disease)    History of colon polyps 2012   Hyperlipidemia    Hypothyroidism, adult    hypothyroidism   PCO (polycystic ovaries)    Plantar fasciitis    Pleural effusion, bilateral    Post-menopausal    Pre-diabetes     Past Surgical History:  Procedure Laterality Date   BRAIN MENINGIOMA EXCISION  1965   at age 60   BREAST EXCISIONAL BIOPSY Left    DIAGNOSTIC LAPAROSCOPY     SYNOVIAL CYST EXCISION  2010   l5, s1 left hip   TONSILLECTOMY     wisom teeth  1984    Prior to Admission medications   Medication Sig Start Date End Date Taking? Authorizing Provider  acetaminophen (TYLENOL) 500 MG tablet Take 1 tablet (500 mg total) by mouth every 6 (six) hours as needed for mild pain, fever or headache. 12/17/17  Yes Lavina Hamman, MD  Bempedoic Acid-Ezetimibe 180-10 MG TABS TAKE 1 TABLET BY MOUTH DAILY. 12/11/19 12/10/20 Yes Liane Comber, NP  Bioflavonoid Products (PERIDRIN-C) 627-03-500 MG TABS Take 2 tablets by mouth daily.    Yes [provider]  cetirizine (ZYRTEC) 10 MG tablet Take 10 mg by mouth daily.   Yes [provider]  doxycycline (VIBRAMYCIN) 100 MG capsule Take 1 capsule by mouth daily with food for acne 08/03/20  Yes Unk Pinto, MD  FLUoxetine (PROZAC) 20 MG capsule TAKE 1 CAPSULE BY MOUTH TWO TIMES DAILY FOR MOOD 05/03/20 05/03/21 Yes McClanahan, Kyra, NP  levothyroxine (SYNTHROID) 75 MCG tablet TAKE 1 TABLET DAILY ON AN  EMPTY STOMACH WITH ONLY WATER FOR 30 MINUTES. NO ANTACID MEDS, CALCIUM OR MAGNESIUM FOR 4 HOURS & AVOID BIOTIN 07/18/20 07/18/21 Yes Unk Pinto, MD  linaclotide [facility]) 145 MCG CAPS capsule TAKE 1 CAPSULE BY MOUTH ONCE A DAY FOR CONSTIPATION 03/09/20 03/09/21 Yes Unk Pinto, MD  metFORMIN (GLUCOPHAGE XR) 500 MG 24 hr tablet Take 1 tablet (500 mg total) by mouth daily with breakfast. For insulin resistance and weight. 05/16/20 05/16/21 Yes Corbett, Caryl Pina, NP  omeprazole (PRILOSEC) 20 MG capsule Take 1 capsule by mouth daily. 08/19/20  Yes Levin Erp, PA  albuterol (VENTOLIN HFA) 108 (90 Base) MCG/ACT inhaler Inhale 2 puffs into the lungs every 6 (six) hours as needed for wheezing or shortness of breath. Patient not taking: Reported on 10/15/2020 06/13/18   Liane Comber, NP  ALPRAZolam Duanne Moron) 0.25 MG tablet Take 1 tablet (0.25 mg total) by mouth 2 (two) times daily as needed for anxiety. Patient not taking: Reported on 10/15/2020 05/16/20   Liane Comber, NP  mometasone (NASONEX) 50 MCG/ACT nasal spray Place 2 sprays into the nose daily. Patient not taking: Reported on 10/15/2020    [provider]  Vitamin D, Ergocalciferol, (DRISDOL) 1.25 MG (50000 UNIT) CAPS capsule TAKE 1 CAPSULE BY MOUTH ONCE A WEEK 10/19/19   Liane Comber, NP  Vitamin D, Ergocalciferol, (DRISDOL) 1.25 MG (  50000 UNIT) CAPS capsule Take 1 capsule by mouth once a week 09/16/20   Magda Bernheim, NP    Current Outpatient Medications  Medication Sig Dispense Refill   acetaminophen (TYLENOL) 500 MG tablet Take 1 tablet (500 mg total) by mouth every 6 (six) hours as needed for mild pain, fever or headache. 30 tablet    Bempedoic Acid-Ezetimibe 180-10 MG TABS TAKE 1 TABLET BY MOUTH DAILY. 90 tablet 3   Bioflavonoid Products (PERIDRIN-C) 200-50-150 MG TABS Take 2 tablets by mouth daily.      cetirizine (ZYRTEC) 10 MG tablet Take 10 mg by mouth daily.     doxycycline (VIBRAMYCIN) 100 MG capsule Take 1  capsule by mouth daily with food for acne 90 capsule 0   FLUoxetine (PROZAC) 20 MG capsule TAKE 1 CAPSULE BY MOUTH TWO TIMES DAILY FOR MOOD 180 capsule 1   levothyroxine (SYNTHROID) 75 MCG tablet TAKE 1 TABLET DAILY ON AN EMPTY STOMACH WITH ONLY WATER FOR 30 MINUTES. NO ANTACID MEDS, CALCIUM OR MAGNESIUM FOR 4 HOURS & AVOID BIOTIN 90 tablet 3   linaclotide (LINZESS) 145 MCG CAPS capsule TAKE 1 CAPSULE BY MOUTH ONCE A DAY FOR CONSTIPATION 90 capsule 3   metFORMIN (GLUCOPHAGE XR) 500 MG 24 hr tablet Take 1 tablet (500 mg total) by mouth daily with breakfast. For insulin resistance and weight. 90 tablet 3   omeprazole (PRILOSEC) 20 MG capsule Take 1 capsule by mouth daily. 90 capsule 3   albuterol (VENTOLIN HFA) 108 (90 Base) MCG/ACT inhaler Inhale 2 puffs into the lungs every 6 (six) hours as needed for wheezing or shortness of breath. (Patient not taking: Reported on 10/15/2020) 1 Inhaler 2   ALPRAZolam (XANAX) 0.25 MG tablet Take 1 tablet (0.25 mg total) by mouth 2 (two) times daily as needed for anxiety. (Patient not taking: Reported on 10/15/2020) 60 tablet 0   mometasone (NASONEX) 50 MCG/ACT nasal spray Place 2 sprays into the nose daily. (Patient not taking: Reported on 10/15/2020)     Vitamin D, Ergocalciferol, (DRISDOL) 1.25 MG (50000 UNIT) CAPS capsule TAKE 1 CAPSULE BY MOUTH ONCE A WEEK 12 capsule 1   Vitamin D, Ergocalciferol, (DRISDOL) 1.25 MG (50000 UNIT) CAPS capsule Take 1 capsule by mouth once a week 24 capsule 1   Current Facility-Administered Medications  Medication Dose Route Frequency Provider Last Rate Last Admin   0.9 %  sodium chloride infusion  500 mL Intravenous Once Ladene Artist, MD        Allergies as of 10/15/2020 - Review Complete 10/15/2020  Allergen Reaction Noted   Advicor [niacin-lovastatin er] Swelling 01/11/2013   Niacin-lovastatin er Swelling and Other (See Comments) 08/07/2010   Rosuvastatin Other (See Comments) 07/19/2020    Family History  Problem  Relation Age of Onset   Dementia Mother    Osteoporosis Mother    Heart disease Father    Parkinson's disease Father    Stroke Father    Diabetes Father    Hyperlipidemia Father    Hypertension Father    Endometrial cancer Sister    Hypertension Brother    Hyperlipidemia Brother    Cancer Maternal Grandfather        brain   Cancer Paternal Aunt        breast   Breast cancer Paternal Aunt    Colon cancer Neg Hx    Esophageal cancer Neg Hx     Social History   Socioeconomic History   Marital status: Divorced    Spouse name:  Not on file   Number of children: Not on file   Years of education: Not on file   Highest education level: Not on file  Occupational History   Not on file  Tobacco Use   Smoking status: Never   Smokeless tobacco: Never  Vaping Use   Vaping Use: Never used  Substance and Sexual Activity   Alcohol use: Yes    Comment: occasional alcohol intake   Drug use: No   Sexual activity: Not Currently  Other Topics Concern   Not on file  Social History Narrative   Not on file   Social Determinants of Health   Financial Resource Strain: Not on file  Food Insecurity: Not on file  Transportation Needs: Not on file  Physical Activity: Not on file  Stress: Not on file  Social Connections: Not on file  Intimate Partner Violence: Not on file    Review of Systems:  All systems reviewed an negative except where noted in HPI.  Gen: Denies any fever, chills, sweats, anorexia, fatigue, weakness, malaise, weight loss, and sleep disorder CV: Denies chest pain, angina, palpitations, syncope, orthopnea, PND, peripheral edema, and claudication. Resp: Denies dyspnea at rest, dyspnea with exercise, cough, sputum, wheezing, coughing up blood, and pleurisy. GI: Denies vomiting blood, jaundice, and fecal incontinence.   Denies dysphagia or odynophagia. GU : Denies urinary burning, blood in urine, urinary frequency, urinary hesitancy, nocturnal urination, and urinary  incontinence. MS: Denies joint pain, limitation of movement, and swelling, stiffness, low back pain, extremity pain. Denies muscle weakness, cramps, atrophy.  Derm: Denies rash, itching, dry skin, hives, moles, warts, or unhealing ulcers.  Psych: Denies depression, anxiety, memory loss, suicidal ideation, hallucinations, paranoia, and confusion. Heme: Denies bruising, bleeding, and enlarged lymph nodes. Neuro:  Denies any headaches, dizziness, paresthesias. Endo:  Denies any problems with DM, thyroid, adrenal function.   Physical Exam: General:  Alert, well-developed, in NAD Head:  Normocephalic and atraumatic. Eyes:  Sclera clear, no icterus.   Conjunctiva pink. Ears:  Normal auditory acuity. Mouth:  No deformity or lesions.  Neck:  Supple; no masses . Lungs:  Clear throughout to auscultation.   No wheezes, crackles, or rhonchi. No acute distress. Heart:  Regular rate and rhythm; no murmurs. Abdomen:  Soft, nondistended, nontender. No masses, hepatomegaly. No obvious masses.  Normal bowel .    Rectal:  Deferred   Msk:  Symmetrical without gross deformities.. Pulses:  Normal pulses noted. Extremities:  Without edema. Neurologic:  Alert and  oriented x4;  grossly normal neurologically. Skin:  Intact without significant lesions or rashes. Cervical Nodes:  No significant cervical adenopathy. Psych:  Alert and cooperative. Normal mood and affect.   Impression / Plan:   GERD, dysphagia, choking, CRC screening average risk for EGD with possible dilation and colonoscopy.    This patient is appropriate for endoscopic procedures in the ambulatory setting.    Pricilla Riffle. Fuller Plan  10/15/2020, 2:51 PM

## 2020-10-15 NOTE — Op Note (Addendum)
[facility] Patient Name: Alison Alvarez Procedure Date: 10/15/2020 2:50 PM MRN: [id number] Endoscopist: Ladene Artist , MD Age: 60 Referring MD:  Date of Birth: Nov 15, 1960 Gender: Female Account #: [id number] Procedure:                Colonoscopy Indications:              Screening for colorectal malignant neoplasm Medicines:                Monitored Anesthesia Care Procedure:                Pre-Anesthesia Assessment:                           - Prior to the procedure, a History and Physical                            was performed, and patient medications and                            allergies were reviewed. The patient's tolerance of                            previous anesthesia was also reviewed. The risks                            and benefits of the procedure and the sedation                            options and risks were discussed with the patient.                            All questions were answered, and informed consent                            was obtained. Prior Anticoagulants: The patient has                            taken no previous anticoagulant or antiplatelet                            agents. ASA Grade Assessment: II - A patient with                            mild systemic disease. After reviewing the risks                            and benefits, the patient was deemed in                            satisfactory condition to undergo the procedure.                           After obtaining informed consent, the colonoscope  was passed under direct vision. Throughout the                            procedure, the patient's blood pressure, pulse, and                            oxygen saturations were monitored continuously. The                            Olympus PCF-[id number] (#[id number]) Colonoscope was                            introduced through the anus and advanced to the the                            cecum,  identified by appendiceal orifice and                            ileocecal valve. The ileocecal valve, appendiceal                            orifice, and rectum were photographed. The quality                            of the bowel preparation was good. The colonoscopy                            was performed without difficulty. The patient                            tolerated the procedure well. Scope In: 2:54:58 PM Scope Out: 3:13:05 PM Scope Withdrawal Time: 0 hours 16 minutes 7 seconds  Total Procedure Duration: 0 hours 18 minutes 7 seconds  Findings:                 The perianal and digital rectal examinations were                            normal.                           A 7 mm polyp was found in the sigmoid colon. The                            polyp was sessile. The polyp was removed with a                            cold snare. Resection and retrieval were complete.                           The exam was otherwise without abnormality on                            direct and retroflexion views. Complications:  No immediate complications. Estimated blood loss:                            None. Estimated Blood Loss:     Estimated blood loss: none. Impression:               - One 7 mm polyp in the sigmoid colon, removed with                            a cold snare. Resected and retrieved.                           - The examination was otherwise normal on direct                            and retroflexion views. Recommendation:           - Repeat colonoscopy after studies are complete for                            surveillance based on pathology results.                           - Patient has a contact number available for                            emergencies. The signs and symptoms of potential                            delayed complications were discussed with the                            patient. Return to normal activities tomorrow.                             Written discharge instructions were provided to the                            patient.                           - Resume previous diet.                           - Continue present medications.                           - Await pathology results. Ladene Artist, MD 10/15/2020 3:16:19 PM This report has been signed electronically.

## 2020-10-15 NOTE — Progress Notes (Signed)
No problems noted in the recovery room. maw 

## 2020-10-15 NOTE — Progress Notes (Deleted)
No problems noted in the recovery room. maw 

## 2020-10-17 ENCOUNTER — Telehealth: Payer: Self-pay | Admitting: *Deleted

## 2020-10-17 NOTE — Telephone Encounter (Signed)
Follow up call made. 

## 2020-10-21 ENCOUNTER — Encounter: Payer: 59 | Admitting: Adult Health

## 2020-10-28 ENCOUNTER — Other Ambulatory Visit (HOSPITAL_COMMUNITY): Payer: Self-pay

## 2020-10-28 ENCOUNTER — Other Ambulatory Visit: Payer: Self-pay | Admitting: Internal Medicine

## 2020-10-28 DIAGNOSIS — L709 Acne, unspecified: Secondary | ICD-10-CM

## 2020-10-28 MED ORDER — DOXYCYCLINE HYCLATE 100 MG PO CAPS
ORAL_CAPSULE | ORAL | 0 refills | Status: DC
Start: 2020-10-28 — End: 2021-02-11
  Filled 2020-10-28: qty 90, 90d supply, fill #0

## 2020-10-29 ENCOUNTER — Encounter: Payer: Self-pay | Admitting: Gastroenterology

## 2020-11-06 ENCOUNTER — Other Ambulatory Visit (HOSPITAL_COMMUNITY): Payer: Self-pay

## 2020-11-06 MED FILL — Bempedoic Acid-Ezetimibe Tab 180-10 MG: ORAL | 90 days supply | Qty: 90 | Fill #1 | Status: AC

## 2020-11-07 ENCOUNTER — Other Ambulatory Visit (HOSPITAL_COMMUNITY): Payer: Self-pay

## 2020-11-11 ENCOUNTER — Other Ambulatory Visit (HOSPITAL_COMMUNITY): Payer: Self-pay

## 2020-11-14 ENCOUNTER — Other Ambulatory Visit (HOSPITAL_COMMUNITY): Payer: Self-pay

## 2020-11-15 DIAGNOSIS — Z86010 Personal history of colonic polyps: Secondary | ICD-10-CM | POA: Insufficient documentation

## 2020-11-15 NOTE — Progress Notes (Signed)
Complete Physical  Assessment and Plan:  Alison Alvarez was seen today for annual exam.  Diagnoses and all orders for this visit:  Encounter for Annual Physical Exam with abnormal findings Due annually  Health Maintenance reviewed Healthy lifestyle reviewed and goals set  Gastroesophageal reflux disease, esophagitis presence not specified Hiatal hernia GI following, continue PPI, hiatal hernia contributing Discussed diet, avoiding triggers and other lifestyle changes -     COMPLETE METABOLIC PANEL WITH GFR -     Magnesium  Fatty liver Weight loss advised, avoid alcohol/tylenol, will monitor LFTs -     COMPLETE METABOLIC PANEL WITH GFR  Thyroid disease continue medications the same pending lab results reminded to take on an empty stomach 30-39mins before food.  check TSH level -     TSH  History of prediabetes -     Hemoglobin A1c -     Urinalysis w microscopic + reflex cultur  Acne, unspecified acne type Improved with doxycycline 100 mg daily Hygiene reviewed -     doxycycline (VIBRAMYCIN) 100 MG capsule; Take 1 capsule daily with food for acne.  Anxiety Well managed by current regimen; continue medications Stress management techniques discussed, increase water, good sleep hygiene discussed, increase exercise, and increase veggies.   Mixed hyperlipidemia/ statin myopathy  Continue medications: nexlizet Continue low cholesterol diet and exercise.  Check lipid panel.  -     TSH -     Lipid panel  Chronic low back pain without sciatica, unspecified back pain laterality Monitor, managing with OTC analgesics  Vitamin D deficiency -     VITAMIN D 25 Hydroxy (Vit-D Deficiency, Fractures)  Environmental allergies Continue OTC allergy pills  Overweight (BMI 25.0-29.9) Long discussion about weight loss, diet, and exercise Recommended diet heavy in fruits and veggies and low in animal meats, cheeses, and dairy products, appropriate calorie intake Patient will work on -  [employer], resistance exercises Discussed appropriate weight for height Follow up at next visit  History of adenomatous colon polyps Next due 2029; 7 year recall per Dr. Fuller Plan Reduce red meat, processed; increase fiber   Family history of heart disease -     EKG 12-Lead  Orders Placed This Encounter  Procedures   CBC with Differential/Platelet   Magnesium   COMPLETE METABOLIC PANEL WITH GFR   Lipid panel   TSH   Hemoglobin A1c   VITAMIN D 25 Hydroxy (Vit-D Deficiency, Fractures)   Microalbumin / creatinine urine ratio   Urinalysis, Routine w reflex microscopic   EKG 12-Lead    Discussed med's effects and SE's. Screening labs and tests as requested with regular follow-up as recommended. Over 40 minutes of exam, counseling, chart review, and complex, high level critical decision making was performed this visit.   No future appointments.    HPI  60 y.o. female  presents for a complete physical and follow up for has Anxiety; Hyperlipidemia; Thyroid disease; History of prediabetes; Chronic back pain; Acne; Vitamin D deficiency; Environmental allergies; GERD (gastroesophageal reflux disease); Overweight (BMI 25.0-29.9); Statin myopathy; Fatty liver; Hiatal hernia; and History of adenomatous polyp of colon on their problem list.    Single, no kids. She works as Chief Executive Officer at [employer], has reduced hours and previous stressful boss has changed jobs.   Follows Dr. Gaetano Net GYN annually.   She gets regular massage therapy for chronic lumbar pain/synovial cyst with benefit, has seen neurosurgery Dr. Carloyn Manner and had lumbar surgery with resection. Managing with OTC analgesics.   She has cystic  acne and folliculitis secondary to PCOS/hirstutism, gets electrolysis which improves significantly. Also uses doxycycline 100 mg.   she has a diagnosis of anxiety and is currently on prozac 20 mg daily, xanax 0.25 mg PRN, reports symptoms are well controlled on  current regimen. she reports she uses benzo rarely, typically when flying.   she has a diagnosis of GERD which is currently managed by omeprazole 20 mg due to GERD with hiatal hernia, has failed attempt to taper off of PPI.   BMI is Body mass index is 29.05 kg/m., she has been working on diet and exercise, has done weight watchers, eating lots of salads, she is doing elliptical at [facility] at work as able, mask mandates limit, will go walking. Has noted smaller inches, feels lighter on her feet.  Wt Readings from Last 3 Encounters:  11/19/20 155 lb (70.3 kg)  10/15/20 155 lb (70.3 kg)  08/19/20 155 lb 9.6 oz (70.6 kg)   Her blood pressure has been controlled at home, today their BP is BP: 112/70 She does workout. She denies chest pain, shortness of breath, dizziness.   She is on cholesterol medication (nexlizet - myalgias/brain fog with even low dose statin) and denies myalgias. Her cholesterol is at goal. The cholesterol last visit was:   Lab Results  Component Value Date   CHOL 186 05/16/2020   HDL 59 05/16/2020   LDLCALC 98 05/16/2020   TRIG 203 (H) 05/16/2020   CHOLHDL 3.2 05/16/2020   She takes metformin 500 mg for hx of insulin resistance and for weight. Last A1C in the office was:  Lab Results  Component Value Date   HGBA1C 5.5 10/18/2019   Last GFR: Lab Results  Component Value Date   GFRNONAA 87 05/16/2020   Patient is on Vitamin D supplement, has reduced to once weekly.  Lab Results  Component Value Date   VD25OH 89 10/18/2019     She is on thyroid medication. Her medication was not changed last visit.  She is taking 75 mcg, 1/2 tab M/F and whole tab all other days.  Lab Results  Component Value Date   TSH 0.61 05/16/2020     Current Medications:  Current Outpatient Medications on File Prior to Visit  Medication Sig Dispense Refill   acetaminophen (TYLENOL) 500 MG tablet Take 1 tablet (500 mg total) by mouth every 6 (six) hours as needed for mild pain, fever or  headache. 30 tablet    albuterol (VENTOLIN HFA) 108 (90 Base) MCG/ACT inhaler Inhale 2 puffs into the lungs every 6 (six) hours as needed for wheezing or shortness of breath. 1 Inhaler 2   ALPRAZolam (XANAX) 0.25 MG tablet Take 1 tablet (0.25 mg total) by mouth 2 (two) times daily as needed for anxiety. 60 tablet 0   Bempedoic Acid-Ezetimibe 180-10 MG TABS TAKE 1 TABLET BY MOUTH DAILY. 90 tablet 3   Bioflavonoid Products (PERIDRIN-C) 200-50-150 MG TABS Take 2 tablets by mouth daily.      cetirizine (ZYRTEC) 10 MG tablet Take 10 mg by mouth daily.     doxycycline (VIBRAMYCIN) 100 MG capsule Take 1 capsule by mouth daily with food for acne 90 capsule 0   FLUoxetine (PROZAC) 20 MG capsule TAKE 1 CAPSULE BY MOUTH TWO TIMES DAILY FOR MOOD 180 capsule 1   levothyroxine (SYNTHROID) 75 MCG tablet TAKE 1 TABLET DAILY ON AN EMPTY STOMACH WITH ONLY WATER FOR 30 MINUTES. NO ANTACID MEDS, CALCIUM OR MAGNESIUM FOR 4 HOURS & AVOID BIOTIN 90 tablet 3  linaclotide (LINZESS) 145 MCG CAPS capsule TAKE 1 CAPSULE BY MOUTH ONCE A DAY FOR CONSTIPATION 90 capsule 3   metFORMIN (GLUCOPHAGE XR) 500 MG 24 hr tablet Take 1 tablet (500 mg total) by mouth daily with breakfast. For insulin resistance and weight. 90 tablet 3   mometasone (NASONEX) 50 MCG/ACT nasal spray Place 2 sprays into the nose daily.     omeprazole (PRILOSEC) 40 MG capsule Take 1 capsule (40 mg total) by mouth 2 (two) times daily. Best to take on an empty stomach 20 - 30 mins before food. 60 capsule 11   Vitamin D, Ergocalciferol, (DRISDOL) 1.25 MG (50000 UNIT) CAPS capsule TAKE 1 CAPSULE BY MOUTH ONCE A WEEK 12 capsule 1   No current facility-administered medications on file prior to visit.   Allergies:  Allergies  Allergen Reactions   Advicor [Niacin-Lovastatin Er] Swelling    Facial swelling   Niacin-Lovastatin Er Swelling and Other (See Comments)    Extreme flushing   Rosuvastatin Other (See Comments)    Myalgias and brain fog even at low dose    Medical History:  She has Anxiety; Hyperlipidemia; Thyroid disease; History of prediabetes; Chronic back pain; Acne; Vitamin D deficiency; Environmental allergies; GERD (gastroesophageal reflux disease); Overweight (BMI 25.0-29.9); Statin myopathy; Fatty liver; Hiatal hernia; and History of adenomatous polyp of colon on their problem list.   Health Maintenance:   Immunization History  Administered Date(s) Administered   DTaP 01/27/2003   Influenza Inj Mdck Quad With Preservative 11/02/2019   Influenza-Unspecified 10/26/2012, 10/27/2018, 11/11/2020   PFIZER(Purple Top)SARS-COV-2 Vaccination 01/24/2019, 02/14/2019, 11/25/2019   Tdap 07/26/2013   Tetanus: 2015 Pneumovax: Prevnar 13:  Flu vaccine: 10/2020 at work Shingrix: declines for now Covid 19: 2/2, 2021, pfizer  LMP: No LMP recorded. Patient is postmenopausal. Pap: 2020 Dr. Gaetano Net, has scheduled upcoming  MGM: 07/2020 DEXA: 2015  Colonoscopy: 09/2020, Dr. Fuller Plan, adenomatous polyps, 7 year recall  EGD: 09/2020, esophagitis  Last vision exam: wears glasses, Dr. Marland Kitchen last 08/2019 Last dental exam: goes q60m, 2022  Patient Care Team: Unk Pinto, MD as PCP - General (Internal Medicine) Ladene Artist, MD as Consulting Physician (Gastroenterology) Everlene Farrier, MD as Consulting Physician (Obstetrics and Gynecology) Emelia Loron, MD as Referring Physician (Neurosurgery)  Surgical History:  She has a past surgical history that includes Synovial cyst excision (2010); Brain meningioma excision (1965); Tonsillectomy; wisom teeth (1984); Diagnostic laparoscopy; and Breast excisional biopsy (Left). Family History:  Herfamily history includes Breast cancer in her paternal aunt; Cancer in her maternal grandfather and paternal aunt; Dementia in her mother; Diabetes in her father; Endometrial cancer in her sister; Heart disease in her father; Hyperlipidemia in her brother and father; Hypertension in her brother and father; Osteoporosis  in her mother; Parkinson's disease in her father; Stroke in her father. Social History:  She reports that she has never smoked. She has never used smokeless tobacco. She reports current alcohol use. She reports that she does not use drugs.  Review of Systems: Review of Systems  Constitutional:  Negative for malaise/fatigue and weight loss.  HENT:  Negative for hearing loss and tinnitus.   Eyes:  Negative for blurred vision and double vision.  Respiratory:  Negative for cough, sputum production, shortness of breath and wheezing.   Cardiovascular:  Negative for chest pain, palpitations, orthopnea, claudication, leg swelling and PND.  Gastrointestinal:  Negative for abdominal pain, blood in stool, constipation, diarrhea, heartburn, melena, nausea and vomiting.  Genitourinary: Negative.   Musculoskeletal:  Positive for back pain (  lumbar, intermittent, worse after work). Negative for falls, joint pain and myalgias.  Skin:  Negative for rash.  Neurological:  Negative for dizziness, tingling, sensory change, weakness and headaches.  Endo/Heme/Allergies:  Negative for polydipsia.  Psychiatric/Behavioral: Negative.  Negative for depression, memory loss, substance abuse and suicidal ideas. The patient is not nervous/anxious and does not have insomnia.   All other systems reviewed and are negative.  Physical Exam: Estimated body mass index is 29.05 kg/m as calculated from the following:   Height as of this encounter: 5' 1.25" (1.556 m).   Weight as of this encounter: 155 lb (70.3 kg). BP 112/70   Pulse 65   Temp (!) 97.5 F (36.4 C)   Ht 5' 1.25" (1.556 m)   Wt 155 lb (70.3 kg)   SpO2 99%   BMI 29.05 kg/m  General Appearance: Well nourished, in no apparent distress.  Eyes: PERRLA, EOMs, conjunctiva no swelling or erythema Sinuses: No Frontal/maxillary tenderness  ENT/Mouth: Ext aud canals clear, normal light reflex with TMs without erythema, bulging. Good dentition. No erythema, swelling,  or exudate on post pharynx. Tonsils not swollen or erythematous. Hearing normal.  Neck: Supple, thyroid normal. No bruits  Respiratory: Respiratory effort normal, BS equal bilaterally without rales, rhonchi, wheezing or stridor.  Cardio: RRR without murmurs, rubs or gallops. Brisk peripheral pulses without edema.  Chest: symmetric, with normal excursions and percussion.  Breasts: Defer to GYN Abdomen: Soft, nontender, no guarding, rebound, hernias, masses, or organomegaly.  Lymphatics: Non tender without lymphadenopathy.  Genitourinary: Defer to GYN Musculoskeletal: Full ROM all peripheral extremities,5/5 strength, and normal gait.  Skin: Warm, dry without rashes, lesions, ecchymosis. Neuro: Cranial nerves intact, reflexes equal bilaterally. Normal muscle tone, no cerebellar symptoms. Sensation intact.  Psych: Awake and oriented X 3, normal affect, Insight and Judgment appropriate.   EKG: NSR no ST changes.  Gorden Harms Earley Grobe 9:42 AM [facility]

## 2020-11-18 ENCOUNTER — Other Ambulatory Visit (HOSPITAL_COMMUNITY): Payer: Self-pay

## 2020-11-19 ENCOUNTER — Other Ambulatory Visit: Payer: Self-pay | Admitting: Adult Health

## 2020-11-19 ENCOUNTER — Encounter: Payer: Self-pay | Admitting: Adult Health

## 2020-11-19 ENCOUNTER — Other Ambulatory Visit: Payer: Self-pay

## 2020-11-19 ENCOUNTER — Other Ambulatory Visit (HOSPITAL_COMMUNITY): Payer: Self-pay

## 2020-11-19 ENCOUNTER — Ambulatory Visit (INDEPENDENT_AMBULATORY_CARE_PROVIDER_SITE_OTHER): Payer: 59 | Admitting: Adult Health

## 2020-11-19 VITALS — BP 112/70 | HR 65 | Temp 97.5°F | Ht 61.25 in | Wt 155.0 lb

## 2020-11-19 DIAGNOSIS — Z0001 Encounter for general adult medical examination with abnormal findings: Secondary | ICD-10-CM

## 2020-11-19 DIAGNOSIS — T466X5A Adverse effect of antihyperlipidemic and antiarteriosclerotic drugs, initial encounter: Secondary | ICD-10-CM

## 2020-11-19 DIAGNOSIS — Z87898 Personal history of other specified conditions: Secondary | ICD-10-CM | POA: Diagnosis not present

## 2020-11-19 DIAGNOSIS — Z1389 Encounter for screening for other disorder: Secondary | ICD-10-CM

## 2020-11-19 DIAGNOSIS — E559 Vitamin D deficiency, unspecified: Secondary | ICD-10-CM

## 2020-11-19 DIAGNOSIS — K760 Fatty (change of) liver, not elsewhere classified: Secondary | ICD-10-CM | POA: Diagnosis not present

## 2020-11-19 DIAGNOSIS — Z0000 Encounter for general adult medical examination without abnormal findings: Secondary | ICD-10-CM

## 2020-11-19 DIAGNOSIS — K219 Gastro-esophageal reflux disease without esophagitis: Secondary | ICD-10-CM

## 2020-11-19 DIAGNOSIS — Z131 Encounter for screening for diabetes mellitus: Secondary | ICD-10-CM

## 2020-11-19 DIAGNOSIS — Z136 Encounter for screening for cardiovascular disorders: Secondary | ICD-10-CM | POA: Diagnosis not present

## 2020-11-19 DIAGNOSIS — Z8249 Family history of ischemic heart disease and other diseases of the circulatory system: Secondary | ICD-10-CM

## 2020-11-19 DIAGNOSIS — R030 Elevated blood-pressure reading, without diagnosis of hypertension: Secondary | ICD-10-CM

## 2020-11-19 DIAGNOSIS — E782 Mixed hyperlipidemia: Secondary | ICD-10-CM | POA: Diagnosis not present

## 2020-11-19 DIAGNOSIS — M545 Low back pain, unspecified: Secondary | ICD-10-CM

## 2020-11-19 DIAGNOSIS — G8929 Other chronic pain: Secondary | ICD-10-CM

## 2020-11-19 DIAGNOSIS — L709 Acne, unspecified: Secondary | ICD-10-CM

## 2020-11-19 DIAGNOSIS — G720 Drug-induced myopathy: Secondary | ICD-10-CM

## 2020-11-19 DIAGNOSIS — E663 Overweight: Secondary | ICD-10-CM

## 2020-11-19 DIAGNOSIS — Z9109 Other allergy status, other than to drugs and biological substances: Secondary | ICD-10-CM

## 2020-11-19 DIAGNOSIS — E079 Disorder of thyroid, unspecified: Secondary | ICD-10-CM | POA: Diagnosis not present

## 2020-11-19 DIAGNOSIS — Z86010 Personal history of colonic polyps: Secondary | ICD-10-CM

## 2020-11-19 DIAGNOSIS — F419 Anxiety disorder, unspecified: Secondary | ICD-10-CM

## 2020-11-19 MED ORDER — ALBUTEROL SULFATE HFA 108 (90 BASE) MCG/ACT IN AERS
2.0000 | INHALATION_SPRAY | Freq: Four times a day (QID) | RESPIRATORY_TRACT | 2 refills | Status: DC | PRN
  Filled 2020-11-19: qty 18, 25d supply, fill #0

## 2020-11-19 NOTE — Patient Instructions (Signed)
Alison Alvarez , Thank you for taking time to come for your Annual Wellness Visit. I appreciate your ongoing commitment to your health goals. Please review the following plan we discussed and let me know if I can assist you in the future.   These are the goals we discussed:  Goals      LDL CALC < 100     Weight (lb) < 145 lb (65.8 kg)        This is a list of the screening recommended for you and due dates:  Health Maintenance  Topic Date Due   Urine Protein Check  09/23/2018   COVID-19 Vaccine (4 - Booster for Pfizer series) 01/20/2020   Zoster (Shingles) Vaccine (1 of 2) 02/19/2021*   Pneumococcal Vaccination (1 - PCV) 11/19/2025*   Mammogram  08/20/2022   Pap Smear  12/12/2022   Tetanus Vaccine  07/27/2023   Colon Cancer Screening  10/16/2027   Flu Shot  Completed   Hepatitis C Screening: USPSTF Recommendation to screen - Ages 18-79 yo.  Completed   HIV Screening  Completed   HPV Vaccine  Aged Out  *Topic was postponed. The date shown is not the original due date.    Know what a healthy weight is for you (roughly BMI <25) and aim to maintain this  Aim for 7+ servings of fruits and vegetables daily  65-80+ fluid ounces of water or unsweet tea for healthy kidneys  Limit to max 1 drink of alcohol per day; avoid smoking/tobacco  Limit animal fats in diet for cholesterol and heart health - choose grass fed whenever available  Avoid highly processed foods, and foods high in saturated/trans fats  Aim for low stress - take time to unwind and care for your mental health  Aim for 150 min of moderate intensity exercise weekly for heart health, and weights twice weekly for bone health  Aim for 7-9 hours of sleep daily    High-Fiber Eating Plan Fiber, also called dietary fiber, is a type of carbohydrate. It is found foods such as fruits, vegetables, whole grains, and beans. A high-fiber diet can have many health benefits. Your health care provider may recommend a high-fiber  diet to help: Prevent constipation. Fiber can make your bowel movements more regular. Lower your cholesterol. Relieve the following conditions: Inflammation of veins in the anus (hemorrhoids). Inflammation of specific areas of the digestive tract (uncomplicated diverticulosis). A problem of the large intestine, also called the colon, that sometimes causes pain and diarrhea (irritable bowel syndrome, or IBS). Prevent overeating as part of a weight-loss plan. Prevent heart disease, type 2 diabetes, and certain cancers. What are tips for following this plan? Reading food labels  Check the nutrition facts label on food products for the amount of dietary fiber. Choose foods that have 5 grams of fiber or more per serving. The goals for recommended daily fiber intake include: Men (age 2 or younger): 34-38 g. Men (over age 64): 28-34 g. Women (age 9 or younger): 25-28 g. Women (over age 1): 22-25 g. Your daily fiber goal is _____________ g. Shopping Choose whole fruits and vegetables instead of processed forms, such as apple juice or applesauce. Choose a wide variety of high-fiber foods such as avocados, lentils, oats, and kidney beans. Read the nutrition facts label of the foods you choose. Be aware of foods with added fiber. These foods often have high sugar and sodium amounts per serving. Cooking Use whole-grain flour for baking and cooking. Cook with brown rice  instead of white rice. Meal planning Start the day with a breakfast that is high in fiber, such as a cereal that contains 5 g of fiber or more per serving. Eat breads and cereals that are made with whole-grain flour instead of refined flour or white flour. Eat brown rice, bulgur wheat, or millet instead of white rice. Use beans in place of meat in soups, salads, and pasta dishes. Be sure that half of the grains you eat each day are whole grains. General information You can get the recommended daily intake of dietary fiber  by: Eating a variety of fruits, vegetables, grains, nuts, and beans. Taking a fiber supplement if you are not able to take in enough fiber in your diet. It is better to get fiber through food than from a supplement. Gradually increase how much fiber you consume. If you increase your intake of dietary fiber too quickly, you may have bloating, cramping, or gas. Drink plenty of water to help you digest fiber. Choose high-fiber snacks, such as berries, raw vegetables, nuts, and popcorn. What foods should I eat? Fruits Berries. Pears. Apples. Oranges. Avocado. Prunes and raisins. Dried figs. Vegetables Sweet potatoes. Spinach. Kale. Artichokes. Cabbage. Broccoli. Cauliflower. Green peas. Carrots. Squash. Grains Whole-grain breads. Multigrain cereal. Oats and oatmeal. Brown rice. Barley. Bulgur wheat. Kathleen. Quinoa. Bran muffins. Popcorn. Rye wafer crackers. Meats and other proteins Navy beans, kidney beans, and pinto beans. Soybeans. Split peas. Lentils. Nuts and seeds. Dairy Fiber-fortified yogurt. Beverages Fiber-fortified soy milk. Fiber-fortified orange juice. Other foods Fiber bars. The items listed above may not be a complete list of recommended foods and beverages. Contact a dietitian for more information. What foods should I avoid? Fruits Fruit juice. Cooked, strained fruit. Vegetables Fried potatoes. Canned vegetables. Well-cooked vegetables. Grains White bread. Pasta made with refined flour. White rice. Meats and other proteins Fatty cuts of meat. Fried chicken or fried fish. Dairy Milk. Yogurt. Cream cheese. Sour cream. Fats and oils Butters. Beverages Soft drinks. Other foods Cakes and pastries. The items listed above may not be a complete list of foods and beverages to avoid. Talk with your dietitian about what choices are best for you. Summary Fiber is a type of carbohydrate. It is found in foods such as fruits, vegetables, whole grains, and beans. A high-fiber  diet has many benefits. It can help to prevent constipation, lower blood cholesterol, aid weight loss, and reduce your risk of heart disease, diabetes, and certain cancers. Increase your intake of fiber gradually. Increasing fiber too quickly may cause cramping, bloating, and gas. Drink plenty of water while you increase the amount of fiber you consume. The best sources of fiber include whole fruits and vegetables, whole grains, nuts, seeds, and beans. This information is not intended to replace advice given to you by your health care provider. Make sure you discuss any questions you have with your health care provider. Document Revised: 05/18/2019 Document Reviewed: 05/18/2019 Elsevier Patient Education  2022 [employer].

## 2020-11-20 LAB — COMPLETE METABOLIC PANEL WITH GFR
AG Ratio: 2 (calc) (ref 1.0–2.5)
ALT: 24 U/L (ref 6–29)
AST: 23 U/L (ref 10–35)
Albumin: 4.6 g/dL (ref 3.6–5.1)
Alkaline phosphatase (APISO): 52 U/L (ref 37–153)
BUN: 20 mg/dL (ref 7–25)
CO2: 30 mmol/L (ref 20–32)
Calcium: 9.9 mg/dL (ref 8.6–10.4)
Chloride: 101 mmol/L (ref 98–110)
Creat: 0.73 mg/dL (ref 0.50–1.05)
Globulin: 2.3 g/dL (calc) (ref 1.9–3.7)
Glucose, Bld: 88 mg/dL (ref 65–99)
Potassium: 4.7 mmol/L (ref 3.5–5.3)
Sodium: 139 mmol/L (ref 135–146)
Total Bilirubin: 0.5 mg/dL (ref 0.2–1.2)
Total Protein: 6.9 g/dL (ref 6.1–8.1)
eGFR: 94 mL/min/{1.73_m2} (ref >=60)

## 2020-11-20 LAB — URINALYSIS, ROUTINE W REFLEX MICROSCOPIC
Bilirubin Urine: NEGATIVE
Glucose, UA: NEGATIVE
Hgb urine dipstick: NEGATIVE
Ketones, ur: NEGATIVE
Leukocytes,Ua: NEGATIVE
Nitrite: NEGATIVE
Protein, ur: NEGATIVE
Specific Gravity, Urine: 1.019 (ref 1.001–1.035)
pH: 6.5 (ref 5.0–8.0)

## 2020-11-20 LAB — MICROALBUMIN / CREATININE URINE RATIO
Creatinine, Urine: 82 mg/dL (ref 20–275)
Microalb Creat Ratio: 5 mcg/mg creat (ref <30)
Microalb, Ur: 0.4 mg/dL

## 2020-11-20 LAB — CBC WITH DIFFERENTIAL/PLATELET
Absolute Monocytes: 310 cells/uL (ref 200–950)
Basophils Absolute: 31 cells/uL (ref 0–200)
Basophils Relative: 1 %
Eosinophils Absolute: 90 cells/uL (ref 15–500)
Eosinophils Relative: 2.9 %
HCT: 40.7 % (ref 35.0–45.0)
Hemoglobin: 13.5 g/dL (ref 11.7–15.5)
Lymphs Abs: 1166 cells/uL (ref 850–3900)
MCH: 31.6 pg (ref 27.0–33.0)
MCHC: 33.2 g/dL (ref 32.0–36.0)
MCV: 95.3 fL (ref 80.0–100.0)
MPV: 9.8 fL (ref 7.5–12.5)
Monocytes Relative: 10 %
Neutro Abs: 1504 cells/uL (ref 1500–7800)
Neutrophils Relative %: 48.5 %
Platelets: 332 10*3/uL (ref 140–400)
RBC: 4.27 10*6/uL (ref 3.80–5.10)
RDW: 12.4 % (ref 11.0–15.0)
Total Lymphocyte: 37.6 %
WBC: 3.1 10*3/uL — ABNORMAL LOW (ref 3.8–10.8)

## 2020-11-20 LAB — TSH: TSH: 1.61 mIU/L (ref 0.40–4.50)

## 2020-11-20 LAB — LIPID PANEL
Cholesterol: 184 mg/dL (ref <200)
HDL: 56 mg/dL (ref >=50)
LDL Cholesterol (Calc): 95 mg/dL (calc)
Non-HDL Cholesterol (Calc): 128 mg/dL (calc) (ref <130)
Total CHOL/HDL Ratio: 3.3 (calc) (ref <5.0)
Triglycerides: 240 mg/dL — ABNORMAL HIGH (ref <150)

## 2020-11-20 LAB — HEMOGLOBIN A1C
Hgb A1c MFr Bld: 5.4 % of total Hgb (ref <5.7)
Mean Plasma Glucose: 108 mg/dL
eAG (mmol/L): 6 mmol/L

## 2020-11-20 LAB — MAGNESIUM: Magnesium: 2.2 mg/dL (ref 1.5–2.5)

## 2020-11-20 LAB — VITAMIN D 25 HYDROXY (VIT D DEFICIENCY, FRACTURES): Vit D, 25-Hydroxy: 76 ng/mL (ref 30–100)

## 2020-11-23 ENCOUNTER — Other Ambulatory Visit (HOSPITAL_COMMUNITY): Payer: Self-pay

## 2020-12-24 ENCOUNTER — Other Ambulatory Visit (HOSPITAL_COMMUNITY): Payer: Self-pay

## 2020-12-24 ENCOUNTER — Other Ambulatory Visit: Payer: Self-pay | Admitting: Internal Medicine

## 2020-12-25 ENCOUNTER — Other Ambulatory Visit (HOSPITAL_COMMUNITY): Payer: Self-pay

## 2020-12-25 MED ORDER — FLUOXETINE HCL 20 MG PO CAPS
ORAL_CAPSULE | ORAL | 1 refills | Status: DC
  Filled 2020-12-25: qty 180, 90d supply, fill #0
  Filled 2021-04-13: qty 180, 90d supply, fill #1

## 2020-12-25 MED ORDER — VITAMIN D (ERGOCALCIFEROL) 1.25 MG (50000 UNIT) PO CAPS
ORAL_CAPSULE | ORAL | 3 refills | Status: DC
  Filled 2020-12-25: qty 12, 84d supply, fill #0
  Filled 2021-04-13: qty 12, 84d supply, fill #1
  Filled 2021-07-18: qty 12, 84d supply, fill #2
  Filled 2021-09-30: qty 12, 84d supply, fill #3

## 2021-01-06 ENCOUNTER — Other Ambulatory Visit (HOSPITAL_COMMUNITY): Payer: Self-pay

## 2021-01-12 MED FILL — Linaclotide Cap 145 MCG: ORAL | 90 days supply | Qty: 90 | Fill #1 | Status: AC

## 2021-01-13 ENCOUNTER — Other Ambulatory Visit (HOSPITAL_COMMUNITY): Payer: Self-pay

## 2021-01-14 ENCOUNTER — Other Ambulatory Visit (HOSPITAL_COMMUNITY): Payer: Self-pay

## 2021-02-04 DIAGNOSIS — Z01419 Encounter for gynecological examination (general) (routine) without abnormal findings: Secondary | ICD-10-CM | POA: Diagnosis not present

## 2021-02-04 DIAGNOSIS — Z6828 Body mass index (BMI) 28.0-28.9, adult: Secondary | ICD-10-CM | POA: Diagnosis not present

## 2021-02-04 DIAGNOSIS — N841 Polyp of cervix uteri: Secondary | ICD-10-CM | POA: Diagnosis not present

## 2021-02-06 LAB — HM PAP SMEAR: HM Pap smear: NEGATIVE

## 2021-02-11 ENCOUNTER — Other Ambulatory Visit: Payer: Self-pay | Admitting: Nurse Practitioner

## 2021-02-11 ENCOUNTER — Other Ambulatory Visit: Payer: Self-pay | Admitting: Adult Health

## 2021-02-11 ENCOUNTER — Other Ambulatory Visit (HOSPITAL_COMMUNITY): Payer: Self-pay

## 2021-02-11 DIAGNOSIS — E782 Mixed hyperlipidemia: Secondary | ICD-10-CM

## 2021-02-11 DIAGNOSIS — L709 Acne, unspecified: Secondary | ICD-10-CM

## 2021-02-11 MED ORDER — DOXYCYCLINE HYCLATE 100 MG PO CAPS
ORAL_CAPSULE | ORAL | 0 refills | Status: DC
  Filled 2021-02-11: qty 90, 90d supply, fill #0

## 2021-02-11 MED ORDER — NEXLIZET 180-10 MG PO TABS
1.0000 | ORAL_TABLET | Freq: Every day | ORAL | 3 refills | Status: DC
  Filled 2021-02-11: qty 90, 90d supply, fill #0
  Filled 2021-06-07: qty 90, 90d supply, fill #1
  Filled 2021-10-09: qty 90, 90d supply, fill #2

## 2021-02-12 ENCOUNTER — Other Ambulatory Visit (HOSPITAL_COMMUNITY): Payer: Self-pay

## 2021-02-18 ENCOUNTER — Other Ambulatory Visit (HOSPITAL_COMMUNITY): Payer: Self-pay

## 2021-03-25 ENCOUNTER — Encounter: Payer: Self-pay | Admitting: Gastroenterology

## 2021-03-25 ENCOUNTER — Ambulatory Visit: Payer: 59 | Admitting: Gastroenterology

## 2021-03-25 VITALS — BP 120/80 | HR 66 | Ht 61.0 in | Wt 154.2 lb

## 2021-03-25 DIAGNOSIS — K2100 Gastro-esophageal reflux disease with esophagitis, without bleeding: Secondary | ICD-10-CM | POA: Diagnosis not present

## 2021-03-25 DIAGNOSIS — Z86010 Personal history of colonic polyps: Secondary | ICD-10-CM

## 2021-03-25 NOTE — Patient Instructions (Addendum)
Decrease your omeprazole to 40 mg daily. If your symptoms get worse then add over the counter famotidine 20 mg at bedtime. If no help then help in symptoms then please contact our office.   The [facility] providers would like to encourage you to use [facility] to communicate with providers for non-urgent requests or questions.  Due to long hold times on the telephone, sending your provider a message by [facility] may be a faster and more efficient way to get a response.  Please allow 48 business hours for a response.  Please remember that this is for non-urgent requests.   Thank you for choosing me and [facility].  Pricilla Riffle. Dagoberto Ligas., MD., [facility] Regal

## 2021-03-25 NOTE — Progress Notes (Signed)
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  Following her dilation her dysphagia had completely resolved.  Her reflux symptoms have been under very good control.  She has no gastrointestinal complaints.  We reviewed findings on endoscopy and pathology.  EGD 09/2020 - LA Grade B reflux esophagitis with no bleeding. - No endoscopic esophageal abnormality to explain patient's dysphagia. Esophagus dilated. - Multiple gastric polyps. Several were resected and retrieved. - Small hiatal hernia. - Normal duodenal bulb and second portion of the duodenum.  Current Medications, Allergies, Past Medical History, Past Surgical History, Family History and Social History were reviewed in [employer] record.   Physical Exam: General: Well developed, well nourished, no acute distress Head: Normocephalic and atraumatic Eyes: Sclerae anicteric, EOMI Ears: Normal auditory acuity Mouth: Not examined, mask on during Covid-19 pandemic Lungs: Clear throughout to auscultation Heart: Regular rate and rhythm; no murmurs, rubs or bruits Abdomen: Soft, non tender and non distended. No masses, hepatosplenomegaly or hernias noted. Normal Bowel sounds Rectal: Not done Musculoskeletal: Symmetrical with no gross deformities  Pulses:  Normal pulses noted Extremities: No clubbing, cyanosis, edema or deformities noted Neurological: Alert oriented x 4, grossly nonfocal Psychological:  Alert and cooperative. Normal mood and affect   Assessment and Recommendations:  GERD with LA Grade B reflux esophagitis.  Follow antireflux measures long-term.  Reduce omeprazole to 40 mg p.o. daily.  If reflux symptoms are not well controlled add famotidine 20 mg at bedtime.  If reflux symptoms or not controlled with that regimen she is advised to call for further advice.  She had several questions which I addressed to her  satisfaction. Personal history of 1 small adenomatous colon polyp.  A 7-year interval surveillance colonoscopy is recommended in September 2029.

## 2021-04-14 ENCOUNTER — Other Ambulatory Visit (HOSPITAL_COMMUNITY): Payer: Self-pay

## 2021-05-09 NOTE — Progress Notes (Signed)
Assessment and Plan: ? ? ?Mixed hyperlipidemia ?At goal on nexlizet; myalgias/brain fog resolved off of statin ?With + family history of cardiac disease;  ?-     COMPLETE METABOLIC PANEL WITH GFR ?-     Lipid panel ? ?History of Pre-diabetes ?Restart metformin per patient preference, hx of insulin resistance ?Discussed disease and risks ?Discussed diet/exercise, weight management  ?A1C at goal last visit; check CMP; defer A1C to CPE  ? ?Hypothyroidism ?continue medications the same pending lab results ?reminded to take on an empty stomach 30-45mns before food.  ?check TSH level ? ?Vitamin D deficiency ?Above goal at recent check; did reduce from 50000 twice weekly to once a week ?continue to recommend supplementation for goal of 60-100 ?Defer vitamin D level to CPE due to cost of lab;  ? ?Anxiety ?Well managed by current regimen; continue medications; rare use of benzo  ?Stress management techniques discussed, increase water, good sleep hygiene discussed, increase exercise, and increase veggies.  ? ?GERD ?Well managed on current medications; on PPI due to hiatal hernia, failed taper to H2i ?Discussed diet, avoiding triggers and other lifestyle changes ? ?Medication management ?-     CBC with Differential/Platelet ?-     COMPLETE METABOLIC PANEL WITH GFR  ?-     Magnesium  ? ?Acne/folliculitis ?She is on doxycycline 100 mg intermittently  ?Electrolysis ? ? ? ?Further disposition pending results of labs. Discussed med's effects and SE's.   ?Over 30 minutes of exam, counseling, chart review, and critical decision making was performed.  ? ?Future Appointments  ?Date Time Provider [place] ?11/20/2021 10:00 AM CLiane Comber NP [facility] None  ? ?------------------------------------------------------------------------------------------------------------------ ? ? ?HPI ?61y.o.female presents for 6 month follow up for hyperlipidemia, hypothyroid, glucose management (hx of prediabetes), vitamin D deficiency  and anxiety.  ? ?She has had some joint aching, lumbar back (hx of decompression), neck, bil thumbs, wakes up with pain and stiffness, also worse after sitting for extended period at work. Was following with Dr. RCarloyn Manneruntil recent retirement, manages with heat, tylenol, aleve intermittently.  ? ?She has cystic acne and folliculitis secondary to PCOS/hirstutism, gets electrolysis which improves significantly. Also uses doxycycline 100 mg intermittently.  ? ?she has a diagnosis of anxiety and is currently on prozac 20 mg daily, xanax 0.25 mg PRN, reports symptoms are well controlled on current regimen. She reports she uses rarely, typically when flying.  ? ?she has a diagnosis of GERD which is currently managed by omeprazole 40 mg due to GERD with hiatal hernia (esophagitis on EGD in 09/2020 and did BID for 12 weeks), has failed attempt to taper off of PPI.  ? ?BMI is Body mass index is 28.53 kg/m?., she has been working on diet, generally healthy choices, trying to increase activity by walking the longer route.  ?Wt Readings from Last 3 Encounters:  ?05/13/21 151 lb (68.5 kg)  ?03/25/21 154 lb 4 oz (70 kg)  ?11/19/20 155 lb (70.3 kg)  ? ?Her blood pressure has been controlled at home, today their BP is BP: 112/66 ? She does workout. She denies chest pain, shortness of breath, dizziness. ? ? She is on cholesterol medication, had myalgia on statin (inculding low dose low frequency rosuvastatin, vytorin) that are fully resolved on nexlizet.  Her LDL cholesterol is at goal. The cholesterol last visit was:   ?Lab Results  ?Component Value Date  ? CHOL 184 11/19/2020  ? HDL 56 11/19/2020  ? L[place]95 11/19/2020  ? TRIG 240 (  H) 11/19/2020  ? CHOLHDL 3.3 11/19/2020  ? ? She has been working on diet and exercise for history of prediabetes recently normalized A1Cs (intermittently A1Cs up to 5.9% predating 2014), and denies increased appetite, nausea, paresthesia of the feet, polydipsia, polyuria and visual disturbances. Last A1C  in the office was:  ?Lab Results  ?Component Value Date  ? HGBA1C 5.4 11/19/2020  ? ?She is on thyroid medication. Her medication was not changed last visit.  Taking 75 mcg daily except 1/2 tab Mon/Fri; takes on empty stomach 30-60 min with water.  ?Lab Results  ?Component Value Date  ? TSH 1.61 11/19/2020  ? ?Patient is on Vitamin D supplement, taking once weekly:  ?Lab Results  ?Component Value Date  ? VD25OH 76 11/19/2020  ?   ? ?She reports will be going on a boat for vacation in July, requesting scopolamine patch which she has done well with in the past.  ? ?Past Medical History:  ?Diagnosis Date  ? Acne   ? Anxiety   ? Arthritis   ? Cervical polyp   ? Chronic back pain   ? Depression   ? Endometriosis   ? GERD (gastroesophageal reflux disease)   ? History of colon polyps 2012  ? Hyperlipidemia   ? Hypothyroidism, adult   ? hypothyroidism  ? PCO (polycystic ovaries)   ? Plantar fasciitis   ? Pleural effusion, bilateral   ? Post-menopausal   ? Pre-diabetes   ?  ? ?Allergies  ?Allergen Reactions  ? Advicor [Niacin-Lovastatin Er] Swelling  ?  Facial swelling  ? Niacin-Lovastatin Er Swelling and Other (See Comments)  ?  Extreme flushing  ? Rosuvastatin Other (See Comments)  ?  Myalgias and brain fog even at low dose  ? ? ?Current Outpatient Medications on File Prior to Visit  ?Medication Sig  ? acetaminophen (TYLENOL) 500 MG tablet Take 1 tablet (500 mg total) by mouth every 6 (six) hours as needed for mild pain, fever or headache.  ? albuterol (VENTOLIN HFA) 108 (90 Base) MCG/ACT inhaler Inhale 2 puffs into the lungs every 6 (six) hours as needed for wheezing or shortness of breath.  ? ALPRAZolam (XANAX) 0.25 MG tablet Take 1 tablet (0.25 mg total) by mouth 2 (two) times daily as needed for anxiety.  ? Bempedoic Acid-Ezetimibe (NEXLIZET) 180-10 MG TABS TAKE 1 TABLET BY MOUTH DAILY.  ? Bioflavonoid Products (PERIDRIN-C) 147-82-956 MG TABS Take 2 tablets by mouth daily.   ? cetirizine (ZYRTEC) 10 MG tablet Take 10  mg by mouth daily.  ? doxycycline (VIBRAMYCIN) 100 MG capsule Take 1 capsule by mouth daily with food for acne  ? FLUoxetine (PROZAC) 20 MG capsule TAKE 1 CAPSULE BY MOUTH TWO TIMES DAILY FOR MOOD  ? levothyroxine (SYNTHROID) 75 MCG tablet TAKE 1 TABLET DAILY ON AN EMPTY STOMACH WITH ONLY WATER FOR 30 MINUTES. NO ANTACID MEDS, CALCIUM OR MAGNESIUM FOR 4 HOURS & AVOID BIOTIN  ? metFORMIN (GLUCOPHAGE XR) 500 MG 24 hr tablet Take 1 tablet (500 mg total) by mouth daily with breakfast for insulin resistance and weight.  ? mometasone (NASONEX) 50 MCG/ACT nasal spray Place 2 sprays into the nose daily.  ? omeprazole (PRILOSEC) 40 MG capsule Take 1 capsule (40 mg total) by mouth 2 (two) times daily. Best to take on an empty stomach 20 - 30 mins before food. (Patient taking differently: Take 40 mg by mouth daily. Best to take on an empty stomach 20 - 30 mins before food.)  ? Vitamin  D, Ergocalciferol, (DRISDOL) 1.25 MG (50000 UNIT) CAPS capsule Take 1 capsule by mouth once a week  ? linaclotide (LINZESS) 145 MCG CAPS capsule TAKE 1 CAPSULE BY MOUTH ONCE A DAY FOR CONSTIPATION  ? ?No current facility-administered medications on file prior to visit.  ? ? ?ROS: Review of Systems  ?Constitutional:  Negative for malaise/fatigue and weight loss.  ?HENT:  Negative for hearing loss and tinnitus.   ?Eyes:  Negative for blurred vision and double vision.  ?Respiratory:  Negative for cough, shortness of breath and wheezing.   ?Cardiovascular:  Negative for chest pain, palpitations, orthopnea, claudication, leg swelling and PND.  ?Gastrointestinal:  Negative for abdominal pain, blood in stool, constipation, diarrhea, heartburn, melena, nausea and vomiting.  ?Genitourinary: Negative.   ?Musculoskeletal:  Negative for joint pain and myalgias.  ?Skin:  Negative for rash.  ?Neurological:  Negative for dizziness, tingling, sensory change, weakness and headaches.  ?Endo/Heme/Allergies:  Negative for polydipsia.  ?Psychiatric/Behavioral:   Negative for depression, substance abuse and suicidal ideas. The patient is not nervous/anxious and does not have insomnia.   ?All other systems reviewed and are negative. ? ? ?Physical Exam: ? ?BP 112/66

## 2021-05-13 ENCOUNTER — Encounter: Payer: Self-pay | Admitting: Adult Health

## 2021-05-13 ENCOUNTER — Ambulatory Visit: Payer: 59 | Admitting: Adult Health

## 2021-05-13 ENCOUNTER — Other Ambulatory Visit (HOSPITAL_COMMUNITY): Payer: Self-pay

## 2021-05-13 ENCOUNTER — Encounter: Payer: Self-pay | Admitting: Internal Medicine

## 2021-05-13 VITALS — BP 112/66 | HR 62 | Temp 97.7°F | Wt 151.0 lb

## 2021-05-13 DIAGNOSIS — Z87898 Personal history of other specified conditions: Secondary | ICD-10-CM | POA: Diagnosis not present

## 2021-05-13 DIAGNOSIS — E782 Mixed hyperlipidemia: Secondary | ICD-10-CM | POA: Diagnosis not present

## 2021-05-13 DIAGNOSIS — E559 Vitamin D deficiency, unspecified: Secondary | ICD-10-CM | POA: Diagnosis not present

## 2021-05-13 DIAGNOSIS — Z79899 Other long term (current) drug therapy: Secondary | ICD-10-CM | POA: Diagnosis not present

## 2021-05-13 DIAGNOSIS — E663 Overweight: Secondary | ICD-10-CM

## 2021-05-13 DIAGNOSIS — E079 Disorder of thyroid, unspecified: Secondary | ICD-10-CM | POA: Diagnosis not present

## 2021-05-13 DIAGNOSIS — F419 Anxiety disorder, unspecified: Secondary | ICD-10-CM

## 2021-05-13 MED ORDER — SCOPOLAMINE 1 MG/3DAYS TD PT72
1.0000 | MEDICATED_PATCH | TRANSDERMAL | 0 refills | Status: DC
  Filled 2021-05-13: qty 4, 12d supply, fill #0

## 2021-05-14 ENCOUNTER — Other Ambulatory Visit: Payer: Self-pay | Admitting: Adult Health

## 2021-05-14 DIAGNOSIS — E039 Hypothyroidism, unspecified: Secondary | ICD-10-CM

## 2021-05-14 LAB — CBC WITH DIFFERENTIAL/PLATELET
Absolute Monocytes: 413 cells/uL (ref 200–950)
Basophils Absolute: 32 cells/uL (ref 0–200)
Basophils Relative: 0.9 %
Eosinophils Absolute: 70 cells/uL (ref 15–500)
Eosinophils Relative: 2 %
HCT: 38.2 % (ref 35.0–45.0)
Hemoglobin: 12.9 g/dL (ref 11.7–15.5)
Lymphs Abs: 1376 cells/uL (ref 850–3900)
MCH: 31.8 pg (ref 27.0–33.0)
MCHC: 33.8 g/dL (ref 32.0–36.0)
MCV: 94.1 fL (ref 80.0–100.0)
MPV: 9.8 fL (ref 7.5–12.5)
Monocytes Relative: 11.8 %
Neutro Abs: 1610 cells/uL (ref 1500–7800)
Neutrophils Relative %: 46 %
Platelets: 368 10*3/uL (ref 140–400)
RBC: 4.06 10*6/uL (ref 3.80–5.10)
RDW: 12.3 % (ref 11.0–15.0)
Total Lymphocyte: 39.3 %
WBC: 3.5 10*3/uL — ABNORMAL LOW (ref 3.8–10.8)

## 2021-05-14 LAB — COMPLETE METABOLIC PANEL WITH GFR
AG Ratio: 2 (calc) (ref 1.0–2.5)
ALT: 24 U/L (ref 6–29)
AST: 26 U/L (ref 10–35)
Albumin: 4.5 g/dL (ref 3.6–5.1)
Alkaline phosphatase (APISO): 54 U/L (ref 37–153)
BUN: 13 mg/dL (ref 7–25)
CO2: 28 mmol/L (ref 20–32)
Calcium: 10.2 mg/dL (ref 8.6–10.4)
Chloride: 99 mmol/L (ref 98–110)
Creat: 0.79 mg/dL (ref 0.50–1.05)
Globulin: 2.3 g/dL (calc) (ref 1.9–3.7)
Glucose, Bld: 86 mg/dL (ref 65–99)
Potassium: 4.8 mmol/L (ref 3.5–5.3)
Sodium: 138 mmol/L (ref 135–146)
Total Bilirubin: 0.4 mg/dL (ref 0.2–1.2)
Total Protein: 6.8 g/dL (ref 6.1–8.1)
eGFR: 85 mL/min/{1.73_m2} (ref >=60)

## 2021-05-14 LAB — LIPID PANEL
Cholesterol: 198 mg/dL (ref <200)
HDL: 59 mg/dL (ref >=50)
LDL Cholesterol (Calc): 109 mg/dL (calc) — ABNORMAL HIGH
Non-HDL Cholesterol (Calc): 139 mg/dL (calc) — ABNORMAL HIGH (ref <130)
Total CHOL/HDL Ratio: 3.4 (calc) (ref <5.0)
Triglycerides: 181 mg/dL — ABNORMAL HIGH (ref <150)

## 2021-05-14 LAB — TSH: TSH: 0.29 mIU/L — ABNORMAL LOW (ref 0.40–4.50)

## 2021-05-14 LAB — MAGNESIUM: Magnesium: 2.2 mg/dL (ref 1.5–2.5)

## 2021-05-14 MED ORDER — LEVOTHYROXINE SODIUM 75 MCG PO TABS: ORAL_TABLET | ORAL | 3 refills | Status: DC

## 2021-05-15 NOTE — Progress Notes (Signed)
Lvm to get NV scheduled

## 2021-05-20 ENCOUNTER — Other Ambulatory Visit (HOSPITAL_COMMUNITY): Payer: Self-pay

## 2021-05-20 ENCOUNTER — Other Ambulatory Visit: Payer: Self-pay | Admitting: Internal Medicine

## 2021-05-20 ENCOUNTER — Telehealth: Payer: Self-pay | Admitting: Adult Health

## 2021-05-20 DIAGNOSIS — E039 Hypothyroidism, unspecified: Secondary | ICD-10-CM

## 2021-05-20 MED ORDER — LEVOTHYROXINE SODIUM 75 MCG PO TABS
ORAL_TABLET | ORAL | 3 refills | Status: DC
  Filled 2021-05-20 (×2): qty 90, 90d supply, fill #0

## 2021-05-20 MED ORDER — LEVOTHYROXINE SODIUM 75 MCG PO TABS
ORAL_TABLET | ORAL | 3 refills | Status: DC
  Filled 2021-05-20: qty 90, 90d supply, fill #0

## 2021-05-20 NOTE — Telephone Encounter (Signed)
Ashley sent in Levothyroxine 04/19 but it says no print. Please resend ?

## 2021-05-20 NOTE — Addendum Note (Signed)
Addended by: Chancy Hurter on: 05/20/2021 11:50 AM ? ? Modules accepted: Orders ? ?

## 2021-05-25 ENCOUNTER — Other Ambulatory Visit: Payer: Self-pay | Admitting: Adult Health

## 2021-05-26 ENCOUNTER — Other Ambulatory Visit (HOSPITAL_COMMUNITY): Payer: Self-pay

## 2021-05-26 MED ORDER — METFORMIN HCL ER 500 MG PO TB24
ORAL_TABLET | ORAL | 3 refills | Status: DC
  Filled 2021-05-26: qty 90, 90d supply, fill #0
  Filled 2021-08-02: qty 90, 90d supply, fill #1
  Filled 2021-11-07: qty 90, 90d supply, fill #2
  Filled 2022-02-08: qty 90, 90d supply, fill #3

## 2021-06-09 ENCOUNTER — Other Ambulatory Visit: Payer: Self-pay | Admitting: Nurse Practitioner

## 2021-06-09 ENCOUNTER — Other Ambulatory Visit (HOSPITAL_COMMUNITY): Payer: Self-pay

## 2021-06-09 DIAGNOSIS — L709 Acne, unspecified: Secondary | ICD-10-CM

## 2021-06-09 MED ORDER — DOXYCYCLINE HYCLATE 100 MG PO CAPS
ORAL_CAPSULE | ORAL | 0 refills | Status: DC
  Filled 2021-06-09: qty 90, 90d supply, fill #0

## 2021-06-10 ENCOUNTER — Other Ambulatory Visit (HOSPITAL_COMMUNITY): Payer: Self-pay

## 2021-06-10 ENCOUNTER — Encounter: Payer: Self-pay | Admitting: Adult Health

## 2021-06-10 ENCOUNTER — Ambulatory Visit: Payer: 59 | Admitting: Adult Health

## 2021-06-10 VITALS — BP 120/62 | HR 63 | Temp 97.3°F | Wt 150.0 lb

## 2021-06-10 DIAGNOSIS — J069 Acute upper respiratory infection, unspecified: Secondary | ICD-10-CM

## 2021-06-10 LAB — POC COVID19 BINAXNOW: SARS Coronavirus 2 Ag: NEGATIVE

## 2021-06-10 MED ORDER — PROMETHAZINE-DM 6.25-15 MG/5ML PO SYRP
5.0000 mL | ORAL_SOLUTION | Freq: Four times a day (QID) | ORAL | 1 refills | Status: DC | PRN
  Filled 2021-06-10: qty 240, 12d supply, fill #0

## 2021-06-10 MED ORDER — PREDNISONE 20 MG PO TABS
ORAL_TABLET | ORAL | 0 refills | Status: DC
  Filled 2021-06-10: qty 10, 7d supply, fill #0

## 2021-06-10 NOTE — Progress Notes (Signed)
Assessment and Plan: ? ?Jaidin was seen today for acute visit. ? ?Diagnoses and all orders for this visit: ? ?Viral URI with cough ?Benign exam with history suggestive of viral URI ?Discussed typical duration and course ?Discussed the importance of avoiding unnecessary antibiotic therapy. ?Suggested symptomatic OTC remedies. ?Nasal saline spray for congestion. ?Nasal steroids, allergy pill, oral steroids offered ?Follow up as needed if prolonged sx lasting longer than 10-14 days or any rebound suggestive of secondary bacterial infection. ?-     predniSONE (DELTASONE) 20 MG tablet; Take 2 tablets by mouth daily for 3 days, then 1 tablet daily for 4 days. ?-     promethazine-dextromethorphan (PROMETHAZINE-DM) 6.25-15 MG/5ML syrup; Take 5 mLs by mouth 4 times daily as needed for cough. ?-     POC COVID-19 ? ?Discussed med's effects and SE's.   ?Over 15 minutes of exam, counseling, chart review, and critical decision making was performed.  ? ?Future Appointments  ?Date Time Provider [place]  ?06/24/2021 10:30 AM [facility] NURSE [facility] None  ?11/20/2021 10:00 AM Liane Comber, NP [facility] None  ? ? ?------------------------------------------------------------------------------------------------------------------ ? ? ?HPI ?BP 120/62   Pulse 63   Temp (!) 97.3 ?F (36.3 ?C)   Wt 150 lb (68 kg)   SpO2 99%   BMI 28.34 kg/m?  ?61 y.o.female with hx of allergic rhinitis presents for evaluation of URI with cough. She tested negative for covid 19 here today.  ? ?She reports sx began 5-6 days ago with congestion, sore throat, with ear pressure/pain, was worst day 3-5 and improving over the last few days, other than cough seems to have settled into her chest, moderately productive throughout the days. Denies fever/chills, wheezing, dyspnea. Chest wall aching with cough only.  ? ?Has been taking sudafed 1-2 times daily ?Had some albuterol and using with some mild benefit. Hx of bronchitis -  ?Taking xyzal -  works well for allergies, denies watery/itchy eyes ? ?Past Medical History:  ?Diagnosis Date  ? Acne   ? Anxiety   ? Arthritis   ? Cervical polyp   ? Chronic back pain   ? Depression   ? Endometriosis   ? GERD (gastroesophageal reflux disease)   ? History of colon polyps 2012  ? Hyperlipidemia   ? Hypothyroidism, adult   ? hypothyroidism  ? PCO (polycystic ovaries)   ? Plantar fasciitis   ? Pleural effusion, bilateral   ? Post-menopausal   ? Pre-diabetes   ?  ? ?Allergies  ?Allergen Reactions  ? Advicor [Niacin-Lovastatin Er] Swelling  ?  Facial swelling  ? Niacin-Lovastatin Er Swelling and Other (See Comments)  ?  Extreme flushing  ? Rosuvastatin Other (See Comments)  ?  Myalgias and brain fog even at low dose  ? ? ?Current Outpatient Medications on File Prior to Visit  ?Medication Sig  ? acetaminophen (TYLENOL) 500 MG tablet Take 1 tablet (500 mg total) by mouth every 6 (six) hours as needed for mild pain, fever or headache.  ? albuterol (VENTOLIN HFA) 108 (90 Base) MCG/ACT inhaler Inhale 2 puffs into the lungs every 6 (six) hours as needed for wheezing or shortness of breath.  ? ALPRAZolam (XANAX) 0.25 MG tablet Take 1 tablet (0.25 mg total) by mouth 2 (two) times daily as needed for anxiety.  ? Bempedoic Acid-Ezetimibe (NEXLIZET) 180-10 MG TABS TAKE 1 TABLET BY MOUTH DAILY.  ? Bioflavonoid Products (PERIDRIN-C) 119-41-740 MG TABS Take 2 tablets by mouth daily.   ? cetirizine (ZYRTEC) 10 MG tablet Take 10  mg by mouth daily.  ? doxycycline (VIBRAMYCIN) 100 MG capsule Take 1 capsule by mouth daily with food for acne  ? FLUoxetine (PROZAC) 20 MG capsule TAKE 1 CAPSULE BY MOUTH TWO TIMES DAILY FOR MOOD  ? levothyroxine (SYNTHROID) 75 MCG tablet TAKE 1 TABLET BT MOUTH ONCE DAILY ON AN EMPTY STOMACH WITH ONLY WATER FOR 30 MINUTES. NO ANTACID MEDS, CALCIUM OR MAGNESIUM FOR 4 HOURS & AVOID BIOTIN (Patient taking differently: 1/2 tablet on M, W, F and 1 tablet all other days)  ? metFORMIN (GLUCOPHAGE-XR) 500 MG 24 hr  tablet Take 1 tablet by mouth daily for Insulin Resistance & Weight  ? mometasone (NASONEX) 50 MCG/ACT nasal spray Place 2 sprays into the nose daily.  ? omeprazole (PRILOSEC) 40 MG capsule Take 1 capsule (40 mg total) by mouth 2 (two) times daily. Best to take on an empty stomach 20 - 30 mins before food. (Patient taking differently: Take 40 mg by mouth daily. Best to take on an empty stomach 20 - 30 mins before food.)  ? scopolamine (TRANSDERM-SCOP) 1 MG/3DAYS Place 1 patch onto the skin every 3 (three) days as needed for nausea/sea sickness.  ? Vitamin D, Ergocalciferol, (DRISDOL) 1.25 MG (50000 UNIT) CAPS capsule Take 1 capsule by mouth once a week  ? linaclotide (LINZESS) 145 MCG CAPS capsule TAKE 1 CAPSULE BY MOUTH ONCE A DAY FOR CONSTIPATION  ? ?No current facility-administered medications on file prior to visit.  ? ? ?ROS: all negative except above.  ? ?Physical Exam: ? ?BP 120/62   Pulse 63   Temp (!) 97.3 ?F (36.3 ?C)   Wt 150 lb (68 kg)   SpO2 99%   BMI 28.34 kg/m?  ? ?General Appearance: Well nourished, in no apparent distress. ?Eyes: PERRLA, EOMs, conjunctiva no swelling or erythema ?Sinuses: No Frontal/maxillary tenderness ?ENT/Mouth: Ext aud canals clear, TMs without erythema, bulging. No erythema, swelling, or exudate on post pharynx.  Tonsils not swollen or erythematous. Hearing normal.  ?Neck: Supple, thyroid normal.  ?Respiratory: Respiratory effort normal, BS equal bilaterally without rales, rhonchi, wheezing or stridor.  ?Cardio: RRR with no MRGs. Brisk peripheral pulses without edema.  ?Lymphatics: Non tender without lymphadenopathy.  ?Musculoskeletal: normal gait.  ?Skin: Warm, dry without rashes, lesions, ecchymosis.  ?Neuro: Normal muscle tone ?Psych: Awake and oriented X 3, normal affect, Insight and Judgment appropriate.  ?  ? ?Izora Ribas, NP ?11:26 AM ?[facility] ? ?

## 2021-06-24 ENCOUNTER — Ambulatory Visit: Payer: 59

## 2021-06-24 VITALS — BP 108/68 | HR 68 | Temp 97.7°F | Wt 149.0 lb

## 2021-06-24 DIAGNOSIS — E079 Disorder of thyroid, unspecified: Secondary | ICD-10-CM

## 2021-06-24 NOTE — Progress Notes (Signed)
Patient presents today for a Nurse visit to recheck TSH. Patient is taking her thyroid medication 1/2 tab on M, W, F and 1 tab the rest of the days with water and no other medications. She is not taking a biotin supplement.

## 2021-06-25 ENCOUNTER — Other Ambulatory Visit: Payer: Self-pay | Admitting: Adult Health

## 2021-06-25 DIAGNOSIS — E039 Hypothyroidism, unspecified: Secondary | ICD-10-CM

## 2021-06-25 LAB — TSH: TSH: 0.25 mIU/L — ABNORMAL LOW (ref 0.40–4.50)

## 2021-06-25 MED ORDER — LEVOTHYROXINE SODIUM 75 MCG PO TABS: ORAL_TABLET | ORAL | 3 refills | Status: DC

## 2021-06-26 ENCOUNTER — Encounter: Payer: Self-pay | Admitting: Adult Health

## 2021-06-27 ENCOUNTER — Other Ambulatory Visit: Payer: Self-pay | Admitting: Adult Health

## 2021-06-27 DIAGNOSIS — E039 Hypothyroidism, unspecified: Secondary | ICD-10-CM

## 2021-06-27 MED ORDER — LEVOTHYROXINE SODIUM 75 MCG PO TABS: ORAL_TABLET | ORAL | 3 refills | Status: DC

## 2021-07-02 ENCOUNTER — Other Ambulatory Visit (HOSPITAL_COMMUNITY): Payer: Self-pay

## 2021-07-18 ENCOUNTER — Other Ambulatory Visit (HOSPITAL_COMMUNITY): Payer: Self-pay

## 2021-08-02 ENCOUNTER — Other Ambulatory Visit (HOSPITAL_COMMUNITY): Payer: Self-pay

## 2021-08-04 ENCOUNTER — Other Ambulatory Visit: Payer: Self-pay | Admitting: Adult Health

## 2021-08-04 ENCOUNTER — Other Ambulatory Visit: Payer: Self-pay | Admitting: Nurse Practitioner

## 2021-08-04 ENCOUNTER — Other Ambulatory Visit: Payer: Self-pay | Admitting: Internal Medicine

## 2021-08-04 ENCOUNTER — Other Ambulatory Visit (HOSPITAL_COMMUNITY): Payer: Self-pay

## 2021-08-04 ENCOUNTER — Ambulatory Visit: Payer: 59

## 2021-08-04 DIAGNOSIS — E039 Hypothyroidism, unspecified: Secondary | ICD-10-CM | POA: Diagnosis not present

## 2021-08-04 DIAGNOSIS — N644 Mastodynia: Secondary | ICD-10-CM

## 2021-08-04 NOTE — Progress Notes (Signed)
Patient presents to the office for a nurse visit to have labs done to check TSH. States that she has been taking Levothyroxine, 1 tablet daily except for 1/2 tablet on M, W, F.

## 2021-08-05 ENCOUNTER — Other Ambulatory Visit: Payer: Self-pay | Admitting: Adult Health

## 2021-08-05 ENCOUNTER — Encounter: Payer: Self-pay | Admitting: Adult Health

## 2021-08-05 ENCOUNTER — Other Ambulatory Visit (HOSPITAL_COMMUNITY): Payer: Self-pay

## 2021-08-05 DIAGNOSIS — E039 Hypothyroidism, unspecified: Secondary | ICD-10-CM

## 2021-08-05 LAB — TSH: TSH: 0.27 mIU/L — ABNORMAL LOW (ref 0.40–4.50)

## 2021-08-05 MED ORDER — FLUOXETINE HCL 20 MG PO CAPS
ORAL_CAPSULE | ORAL | 1 refills | Status: DC
  Filled 2021-08-05: qty 180, 90d supply, fill #0
  Filled 2021-11-07: qty 180, 90d supply, fill #1
  Filled 2021-11-07: qty 7, 3d supply, fill #1

## 2021-08-05 MED ORDER — LEVOTHYROXINE SODIUM 75 MCG PO TABS: ORAL_TABLET | ORAL | 3 refills | Status: DC

## 2021-08-06 ENCOUNTER — Other Ambulatory Visit: Payer: Self-pay | Admitting: Adult Health

## 2021-08-06 ENCOUNTER — Other Ambulatory Visit (HOSPITAL_COMMUNITY): Payer: Self-pay

## 2021-08-06 DIAGNOSIS — E039 Hypothyroidism, unspecified: Secondary | ICD-10-CM

## 2021-08-06 MED ORDER — LEVOTHYROXINE SODIUM 75 MCG PO TABS
ORAL_TABLET | ORAL | 3 refills | Status: DC
  Filled 2021-08-06: qty 90, 90d supply, fill #0
  Filled 2021-11-09: qty 90, 90d supply, fill #1
  Filled 2022-03-30 – 2022-04-02 (×2): qty 90, 90d supply, fill #2
  Filled 2022-06-28: qty 90, 90d supply, fill #3

## 2021-08-07 ENCOUNTER — Other Ambulatory Visit (HOSPITAL_COMMUNITY): Payer: Self-pay

## 2021-08-07 ENCOUNTER — Other Ambulatory Visit: Payer: Self-pay | Admitting: Internal Medicine

## 2021-08-07 MED ORDER — LINACLOTIDE 145 MCG PO CAPS
145.0000 ug | ORAL_CAPSULE | Freq: Every day | ORAL | 3 refills | Status: DC
  Filled 2021-08-07 (×2): qty 90, 90d supply, fill #0
  Filled 2021-11-07: qty 90, 90d supply, fill #1
  Filled 2022-02-23: qty 90, 90d supply, fill #2

## 2021-09-01 ENCOUNTER — Ambulatory Visit
Admission: RE | Admit: 2021-09-01 | Discharge: 2021-09-01 | Disposition: A | Discharge status: Home / Self Care | Payer: 59 | Source: Ambulatory Visit | Attending: Nurse Practitioner | Admitting: Nurse Practitioner

## 2021-09-01 DIAGNOSIS — R928 Other abnormal and inconclusive findings on diagnostic imaging of breast: Secondary | ICD-10-CM | POA: Diagnosis not present

## 2021-09-01 DIAGNOSIS — N644 Mastodynia: Secondary | ICD-10-CM

## 2021-09-30 ENCOUNTER — Other Ambulatory Visit (HOSPITAL_COMMUNITY): Payer: Self-pay

## 2021-10-01 ENCOUNTER — Other Ambulatory Visit (HOSPITAL_COMMUNITY): Payer: Self-pay

## 2021-10-07 ENCOUNTER — Other Ambulatory Visit: Payer: Self-pay | Admitting: Nurse Practitioner

## 2021-10-07 DIAGNOSIS — L709 Acne, unspecified: Secondary | ICD-10-CM

## 2021-10-08 ENCOUNTER — Other Ambulatory Visit (HOSPITAL_COMMUNITY): Payer: Self-pay

## 2021-10-08 MED ORDER — DOXYCYCLINE HYCLATE 100 MG PO CAPS
ORAL_CAPSULE | ORAL | 0 refills | Status: DC
  Filled 2021-10-08: qty 90, 90d supply, fill #0

## 2021-10-09 ENCOUNTER — Other Ambulatory Visit (HOSPITAL_COMMUNITY): Payer: Self-pay

## 2021-10-10 ENCOUNTER — Other Ambulatory Visit (HOSPITAL_COMMUNITY): Payer: Self-pay

## 2021-10-14 ENCOUNTER — Other Ambulatory Visit (HOSPITAL_COMMUNITY): Payer: Self-pay

## 2021-10-14 ENCOUNTER — Telehealth: Payer: Self-pay | Admitting: Nurse Practitioner

## 2021-10-14 NOTE — Telephone Encounter (Signed)
Pt is wanting an update on PA for Nexlizet

## 2021-10-15 ENCOUNTER — Other Ambulatory Visit: Payer: Self-pay

## 2021-10-15 ENCOUNTER — Other Ambulatory Visit (HOSPITAL_COMMUNITY): Payer: Self-pay

## 2021-10-15 DIAGNOSIS — E782 Mixed hyperlipidemia: Secondary | ICD-10-CM

## 2021-10-15 MED ORDER — NEXLIZET 180-10 MG PO TABS
1.0000 | ORAL_TABLET | Freq: Every day | ORAL | 3 refills | Status: DC
  Filled 2021-10-15: qty 90, 90d supply, fill #0
  Filled 2022-01-13 (×2): qty 90, 90d supply, fill #1
  Filled 2022-04-14: qty 90, 90d supply, fill #2
  Filled 2022-07-13: qty 90, 90d supply, fill #3

## 2021-10-16 ENCOUNTER — Telehealth: Payer: Self-pay

## 2021-10-16 NOTE — Telephone Encounter (Signed)
Prior Authorization Approval for Nexlizet, 10/15/21-10/15/22

## 2021-10-20 ENCOUNTER — Other Ambulatory Visit (HOSPITAL_COMMUNITY): Payer: Self-pay

## 2021-10-21 ENCOUNTER — Other Ambulatory Visit (HOSPITAL_COMMUNITY): Payer: Self-pay

## 2021-10-22 ENCOUNTER — Telehealth: Payer: Self-pay | Admitting: Nurse Practitioner

## 2021-10-22 NOTE — Telephone Encounter (Signed)
Pt was supposed to get her TSH rechecked 4-6 from 07/10 visit. Has been 2 months and never called back to get that done, her CPE is 10/31 with Tonya. Told her it may not be worth doing now because it will change when she has her physical. But leaving it up to [facility], please advise

## 2021-11-07 ENCOUNTER — Other Ambulatory Visit (HOSPITAL_COMMUNITY): Payer: Self-pay

## 2021-11-08 ENCOUNTER — Other Ambulatory Visit (HOSPITAL_COMMUNITY): Payer: Self-pay

## 2021-11-10 ENCOUNTER — Other Ambulatory Visit: Payer: Self-pay

## 2021-11-10 ENCOUNTER — Emergency Department (HOSPITAL_BASED_OUTPATIENT_CLINIC_OR_DEPARTMENT_OTHER)
Admission: EM | Admit: 2021-11-10 | Discharge: 2021-11-11 | Disposition: A | Discharge status: Home / Self Care | Payer: 59 | Attending: Emergency Medicine | Admitting: Emergency Medicine

## 2021-11-10 ENCOUNTER — Encounter (HOSPITAL_BASED_OUTPATIENT_CLINIC_OR_DEPARTMENT_OTHER): Payer: Self-pay | Admitting: Emergency Medicine

## 2021-11-10 DIAGNOSIS — S0083XA Contusion of other part of head, initial encounter: Secondary | ICD-10-CM | POA: Insufficient documentation

## 2021-11-10 DIAGNOSIS — W010XXA Fall on same level from slipping, tripping and stumbling without subsequent striking against object, initial encounter: Secondary | ICD-10-CM | POA: Diagnosis not present

## 2021-11-10 DIAGNOSIS — Y9373 Activity, racquet and hand sports: Secondary | ICD-10-CM | POA: Insufficient documentation

## 2021-11-10 DIAGNOSIS — S0232XA Fracture of orbital floor, left side, initial encounter for closed fracture: Secondary | ICD-10-CM | POA: Insufficient documentation

## 2021-11-10 DIAGNOSIS — W19XXXA Unspecified fall, initial encounter: Secondary | ICD-10-CM

## 2021-11-10 DIAGNOSIS — Z7984 Long term (current) use of oral hypoglycemic drugs: Secondary | ICD-10-CM | POA: Insufficient documentation

## 2021-11-10 DIAGNOSIS — S60511A Abrasion of right hand, initial encounter: Secondary | ICD-10-CM | POA: Diagnosis not present

## 2021-11-10 DIAGNOSIS — S0592XA Unspecified injury of left eye and orbit, initial encounter: Secondary | ICD-10-CM | POA: Diagnosis present

## 2021-11-10 DIAGNOSIS — S60512A Abrasion of left hand, initial encounter: Secondary | ICD-10-CM | POA: Diagnosis not present

## 2021-11-10 NOTE — ED Triage Notes (Signed)
Fall playing pickleball  Fall onto left side face -abrasion, bruises and swelling noted Abrasion to left hand and knee, neck pain No LOC. Happened around 6:15pm

## 2021-11-11 ENCOUNTER — Other Ambulatory Visit (HOSPITAL_COMMUNITY): Payer: Self-pay

## 2021-11-11 ENCOUNTER — Emergency Department (HOSPITAL_BASED_OUTPATIENT_CLINIC_OR_DEPARTMENT_OTHER): Payer: 59

## 2021-11-11 DIAGNOSIS — S0990XA Unspecified injury of head, initial encounter: Secondary | ICD-10-CM | POA: Diagnosis not present

## 2021-11-11 DIAGNOSIS — S0083XA Contusion of other part of head, initial encounter: Secondary | ICD-10-CM | POA: Diagnosis not present

## 2021-11-11 DIAGNOSIS — S60512A Abrasion of left hand, initial encounter: Secondary | ICD-10-CM | POA: Diagnosis not present

## 2021-11-11 DIAGNOSIS — S60511A Abrasion of right hand, initial encounter: Secondary | ICD-10-CM | POA: Diagnosis not present

## 2021-11-11 DIAGNOSIS — R220 Localized swelling, mass and lump, head: Secondary | ICD-10-CM | POA: Diagnosis not present

## 2021-11-11 DIAGNOSIS — Z7984 Long term (current) use of oral hypoglycemic drugs: Secondary | ICD-10-CM | POA: Diagnosis not present

## 2021-11-11 DIAGNOSIS — S0993XA Unspecified injury of face, initial encounter: Secondary | ICD-10-CM | POA: Diagnosis not present

## 2021-11-11 DIAGNOSIS — S0232XA Fracture of orbital floor, left side, initial encounter for closed fracture: Secondary | ICD-10-CM | POA: Diagnosis not present

## 2021-11-11 NOTE — ED Notes (Signed)
Pt verbalizes understanding of discharge instructions. Opportunity for questioning and answers were provided. Pt discharged from ED to home with husband.    

## 2021-11-11 NOTE — Discharge Instructions (Signed)
Ice for 20 minutes every 2 hours while awake for the next 2 days.  Follow-up with ophthalmology in the next 2 to 3 days.  The contact information for Dr. Hollice Espy has been provided in this discharge summary for you to call and make these arrangements.

## 2021-11-11 NOTE — ED Provider Notes (Signed)
[place] EMERGENCY DEPT Provider Note   CSN: [id number] Arrival date & time: 11/10/21  1935     History  Chief Complaint  Patient presents with   Alison Alvarez is a 61 y.o. female.  Patient is a 61 year old female with history of hyperlipidemia, GERD.  Patient presenting today for evaluation of a fall.  She was playing pickle ball this evening when she lost her balance and fell.  She landed on the left side of her face.  She has swelling to the chin and soft tissues around her left eye.  She denies any loss of consciousness.  She does report headache, but denies any visual disturbances, weakness, or numbness.  She denies neck pain.  She also has several abrasions to both arms and hands.  The history is provided by the patient.       Home Medications Prior to Admission medications   Medication Sig Start Date End Date Taking? Authorizing Provider  acetaminophen (TYLENOL) 500 MG tablet Take 1 tablet (500 mg total) by mouth every 6 (six) hours as needed for mild pain, fever or headache. 12/17/17   Lavina Hamman, MD  albuterol (VENTOLIN HFA) 108 (90 Base) MCG/ACT inhaler Inhale 2 puffs into the lungs every 6 (six) hours as needed for wheezing or shortness of breath. 11/19/20   Liane Comber, NP  ALPRAZolam Duanne Moron) 0.25 MG tablet Take 1 tablet (0.25 mg total) by mouth 2 (two) times daily as needed for anxiety. 05/16/20   Liane Comber, NP  Bempedoic Acid-Ezetimibe (NEXLIZET) 180-10 MG TABS TAKE 1 TABLET BY MOUTH DAILY. 10/15/21 10/15/22  Darrol Jump, NP  Bioflavonoid Products (PERIDRIN-C) 200-50-150 MG TABS Take 2 tablets by mouth daily.     [provider]  cetirizine (ZYRTEC) 10 MG tablet Take 10 mg by mouth daily.    [provider]  doxycycline (VIBRAMYCIN) 100 MG capsule Take 1 capsule by mouth daily with food for acne 10/08/21   Alycia Rossetti, NP  FLUoxetine (PROZAC) 20 MG capsule TAKE 1 CAPSULE BY MOUTH TWO TIMES DAILY  FOR MOOD 08/05/21 08/05/22  Liane Comber, NP  levothyroxine (SYNTHROID) 75 MCG tablet Take 1/2 tab daily except whole tab Mon/Thurs. Take on an empty stomach with water only. 08/05/21   Liane Comber, NP  levothyroxine (SYNTHROID) 75 MCG tablet TAKE 1 TABLET BT MOUTH ONCE DAILY ON AN EMPTY STOMACH WITH ONLY WATER FOR 30 MINUTES. NO ANTACID MEDS, CALCIUM OR MAGNESIUM FOR 4 HOURS & AVOID BIOTIN 08/06/21 08/06/22  Alycia Rossetti, NP  linaclotide [facility]) 145 MCG CAPS capsule Take 1 capsule (145 mcg total) by mouth daily for constipation. 08/07/21   Liane Comber, NP  metFORMIN (GLUCOPHAGE-XR) 500 MG 24 hr tablet Take 1 tablet by mouth daily for Insulin Resistance & Weight 05/26/21   Unk Pinto, MD  mometasone (NASONEX) 50 MCG/ACT nasal spray Place 2 sprays into the nose daily.    [provider]  omeprazole (PRILOSEC) 40 MG capsule Take 1 capsule (40 mg total) by mouth 2 (two) times daily. Best to take on an empty stomach 20 - 30 mins before food. Patient taking differently: Take 40 mg by mouth daily. Best to take on an empty stomach 20 - 30 mins before food. 10/15/20   Ladene Artist, MD  predniSONE (DELTASONE) 20 MG tablet Take 2 tablets by mouth daily for 3 days, then 1 tablet daily for 4 days. 06/10/21   Liane Comber, NP  promethazine-dextromethorphan (PROMETHAZINE-DM) 6.25-15 MG/5ML syrup Take  5 mLs by mouth 4 times daily as needed for cough. 06/10/21   Liane Comber, NP  scopolamine (TRANSDERM-SCOP) 1 MG/3DAYS Place 1 patch onto the skin every 3 (three) days as needed for nausea/sea sickness. 05/13/21   Liane Comber, NP  Vitamin D, Ergocalciferol, (DRISDOL) 1.25 MG (50000 UNIT) CAPS capsule Take 1 capsule by mouth once a week 12/25/20   Liane Comber, NP      Allergies    Advicor [niacin-lovastatin er], Niacin-lovastatin er, and Rosuvastatin    Review of Systems   Review of Systems  All other systems reviewed and are negative.   Physical Exam Updated Vital  Signs BP 119/69   Pulse 63   Temp 98.2 F (36.8 C)   Resp 16   SpO2 99%  Physical Exam Vitals and nursing note reviewed.  Constitutional:      General: She is not in acute distress.    Appearance: She is well-developed. She is not diaphoretic.  HENT:     Head: Normocephalic.     Comments: There is swelling and ecchymosis noted to the left periorbital soft tissues and forehead.    There is also swelling noted to the chin.  She is able to open and close her mouth with no crepitus.  There is no malocclusion. Eyes:     Extraocular Movements: Extraocular movements intact.     Pupils: Pupils are equal, round, and reactive to light.  Cardiovascular:     Rate and Rhythm: Normal rate and regular rhythm.     Heart sounds: No murmur heard.    No friction rub. No gallop.  Pulmonary:     Effort: Pulmonary effort is normal. No respiratory distress.     Breath sounds: Normal breath sounds. No wheezing.  Abdominal:     General: Bowel sounds are normal. There is no distension.     Palpations: Abdomen is soft.     Tenderness: There is no abdominal tenderness.  Musculoskeletal:        General: Normal range of motion.     Cervical back: Normal range of motion and neck supple.  Skin:    General: Skin is warm and dry.  Neurological:     General: No focal deficit present.     Mental Status: She is alert and oriented to person, place, and time.     Cranial Nerves: No cranial nerve deficit.     Motor: No weakness.     Coordination: Coordination normal.     ED Results / Procedures / Treatments   Labs (all labs ordered are listed, but only abnormal results are displayed) Labs Reviewed - No data to display  EKG None  Radiology No results found.  Procedures Procedures    Medications Ordered in ED Medications - No data to display  ED Course/ Medical Decision Making/ A&P  Patient is a 61 year old female presenting with complaints of facial injuries from a fall while playing pickle  ball.  She is neurologically intact and CT scan of the head is unremarkable.  Maxillofacial CT does show a slightly depressed fracture through the floor of the left orbit, but no evidence for muscular entrapment on the scan or with physical exam.  Patient to be discharged with ice, rest, and follow-up with ophthalmology regarding the orbital fracture.  Final Clinical Impression(s) / ED Diagnoses Final diagnoses:  None    Rx / DC Orders ED Discharge Orders     None         Veryl Speak, MD 11/11/21  0120  

## 2021-11-20 ENCOUNTER — Encounter: Payer: 59 | Admitting: Nurse Practitioner

## 2021-11-25 ENCOUNTER — Ambulatory Visit: Payer: 59 | Admitting: Nurse Practitioner

## 2021-11-25 ENCOUNTER — Other Ambulatory Visit (HOSPITAL_COMMUNITY): Payer: Self-pay

## 2021-11-25 ENCOUNTER — Encounter: Payer: 59 | Admitting: Adult Health

## 2021-11-25 VITALS — BP 98/70 | HR 71 | Temp 97.5°F | Ht 61.0 in | Wt 154.4 lb

## 2021-11-25 DIAGNOSIS — M545 Low back pain, unspecified: Secondary | ICD-10-CM

## 2021-11-25 DIAGNOSIS — Z87898 Personal history of other specified conditions: Secondary | ICD-10-CM | POA: Diagnosis not present

## 2021-11-25 DIAGNOSIS — E079 Disorder of thyroid, unspecified: Secondary | ICD-10-CM

## 2021-11-25 DIAGNOSIS — K219 Gastro-esophageal reflux disease without esophagitis: Secondary | ICD-10-CM

## 2021-11-25 DIAGNOSIS — K317 Polyp of stomach and duodenum: Secondary | ICD-10-CM

## 2021-11-25 DIAGNOSIS — Z8249 Family history of ischemic heart disease and other diseases of the circulatory system: Secondary | ICD-10-CM | POA: Diagnosis not present

## 2021-11-25 DIAGNOSIS — E663 Overweight: Secondary | ICD-10-CM

## 2021-11-25 DIAGNOSIS — I10 Essential (primary) hypertension: Secondary | ICD-10-CM

## 2021-11-25 DIAGNOSIS — K449 Diaphragmatic hernia without obstruction or gangrene: Secondary | ICD-10-CM

## 2021-11-25 DIAGNOSIS — G8929 Other chronic pain: Secondary | ICD-10-CM

## 2021-11-25 DIAGNOSIS — Z0001 Encounter for general adult medical examination with abnormal findings: Secondary | ICD-10-CM

## 2021-11-25 DIAGNOSIS — Z0000 Encounter for general adult medical examination without abnormal findings: Secondary | ICD-10-CM

## 2021-11-25 DIAGNOSIS — E782 Mixed hyperlipidemia: Secondary | ICD-10-CM | POA: Diagnosis not present

## 2021-11-25 DIAGNOSIS — Z79899 Other long term (current) drug therapy: Secondary | ICD-10-CM

## 2021-11-25 DIAGNOSIS — K760 Fatty (change of) liver, not elsewhere classified: Secondary | ICD-10-CM | POA: Diagnosis not present

## 2021-11-25 DIAGNOSIS — Z136 Encounter for screening for cardiovascular disorders: Secondary | ICD-10-CM | POA: Diagnosis not present

## 2021-11-25 DIAGNOSIS — Z1389 Encounter for screening for other disorder: Secondary | ICD-10-CM | POA: Diagnosis not present

## 2021-11-25 DIAGNOSIS — L709 Acne, unspecified: Secondary | ICD-10-CM

## 2021-11-25 DIAGNOSIS — Z860101 Personal history of adenomatous and serrated colon polyps: Secondary | ICD-10-CM

## 2021-11-25 DIAGNOSIS — Z9109 Other allergy status, other than to drugs and biological substances: Secondary | ICD-10-CM

## 2021-11-25 DIAGNOSIS — F419 Anxiety disorder, unspecified: Secondary | ICD-10-CM

## 2021-11-25 DIAGNOSIS — E559 Vitamin D deficiency, unspecified: Secondary | ICD-10-CM

## 2021-11-25 DIAGNOSIS — Z86010 Personal history of colonic polyps: Secondary | ICD-10-CM

## 2021-11-25 MED ORDER — OMEPRAZOLE 40 MG PO CPDR
40.0000 mg | DELAYED_RELEASE_CAPSULE | Freq: Every day | ORAL | 3 refills | Status: DC
  Filled 2021-11-25: qty 90, 90d supply, fill #0
  Filled 2022-02-23: qty 90, 90d supply, fill #1
  Filled 2022-05-17: qty 90, 90d supply, fill #2
  Filled 2022-08-06 (×2): qty 90, 90d supply, fill #3

## 2021-11-25 NOTE — Patient Instructions (Signed)

## 2021-11-25 NOTE — Progress Notes (Addendum)
Complete Physical  Assessment and Plan:  Leo was seen today for annual exam.  Diagnoses and all orders for this visit:  Encounter for Annual Physical Exam with abnormal findings Due annually  Health Maintenance reviewed Healthy lifestyle reviewed and goals set  Gastroesophageal reflux disease, esophagitis presence not specified Hiatal hernia Continue Omeprazole No suspected reflux complications (Barret/stricture). Lifestyle modification:  wt loss, avoid meals 2-3h before bedtime. Consider eliminating food triggers:  chocolate, caffeine, EtOH, acid/spicy food.   Fatty liver Improved/Resolved Continue to monitor  Thyroid disease Continue Levothyroxine. Dose to be changed based off of most current result Reminded to take on an empty stomach 30-30mns before food.  Stop any Biotin Supplement 48-72 hours before next TSH level to reduce the risk of falsely low TSH levels. Continue to monitor.     History of prediabetes 10/2020 - 5.4% Education: Reviewed 'ABCs' of diabetes management  Discussed goals to be met and/or maintained include A1C (<7) Blood pressure (<130/80) Cholesterol (LDL <70) Continue Eye Exam yearly  Continue Dental Exam Q6 mo Discussed dietary recommendations Discussed Physical Activity recommendations Check A1C   Acne, unspecified acne type Improved with doxycycline 100 mg daily Hygiene reviewed  Anxiety Well managed by current regimen; continue medications Stress management techniques discussed, increase water, good sleep hygiene discussed, increase exercise, and increase veggies.   Mixed hyperlipidemia/ statin myopathy  Discussed lifestyle modifications. Recommended diet heavy in fruits and veggies, omega 3's. Decrease consumption of animal meats, cheeses, and dairy products. Remain active and exercise as tolerated. Continue to monitor. Check lipids/TSH   Chronic low back pain without sciatica, unspecified back pain laterality Monitor,  managing with OTC analgesics 2010 - PT Completed - Has reasonable accomodation.  Vitamin D deficiency Continue supplement Monitor levels  Environmental allergies Continue OTC antihistamine Avoid triggers  Overweight (BMI 25.0-29.9) Discussed appropriate BMI Diet modification. Physical activity. Encouraged/praised to build confidence.   History of adenomatous colon polyps Next due 2029; 7 year recall per Dr. SFuller PlanReduce red meat, processed; increase fiber   Family history of heart disease  Check and monitor EKG  Orders Placed This Encounter  Procedures   CBC with Differential/Platelet   COMPLETE METABOLIC PANEL WITH GFR   Magnesium   Lipid panel   TSH   Hemoglobin A1c   Insulin, random   VITAMIN D 25 Hydroxy (Vit-D Deficiency, Fractures)   Urinalysis, Routine w reflex microscopic   Microalbumin / creatinine urine ratio   EKG 12-Lead   Meds ordered this encounter  Medications   omeprazole (PRILOSEC) 40 MG capsule    Sig: Take 1 capsule (40 mg total) by mouth daily. Best to take on an empty stomach 20 - 30 mins before food.    Dispense:  90 capsule    Refill:  3    Notify office for further evaluation and treatment, questions or concerns if any reported s/s fail to improve.   The patient was advised to call back or seek an in-person evaluation if any symptoms worsen or if the condition fails to improve as anticipated.   Further disposition pending results of labs. Discussed med's effects and SE's.    I discussed the assessment and treatment plan with the patient. The patient was provided an opportunity to ask questions and all were answered. The patient agreed with the plan and demonstrated an understanding of the instructions.  Discussed med's effects and SE's. Screening labs and tests as requested with regular follow-up as recommended.  I provided 35 minutes of face-to-face time during this  encounter including counseling, chart review, and critical decision  making was preformed.   Future Appointments  Date Time Provider [place]  11/26/2022  2:00 PM Darrol Jump, NP [facility] None      HPI  61 y.o. female  presents for a complete physical and follow up for has Anxiety; Hyperlipidemia; Thyroid disease; History of prediabetes; Chronic back pain; Acne; Vitamin D deficiency; Environmental allergies; GERD (gastroesophageal reflux disease); Overweight (BMI 25.0-29.9); Statin myopathy; Fatty liver; Hiatal hernia; and History of adenomatous polyp of colon on their problem list.    Single, no kids. She works as Chief Executive Officer at [employer], has reduced hours and previous stressful boss has changed jobs.   She had a fall on 11/10/21 after a pickle ball accident.  Went to [facility] ER and was noted to have a left side of facial depressed fracture through the floor of the left orbit, She has a f/u with Opth on 12/09/21.  Follows Dr. Gaetano Net [facility] annually.  Had mammogram 08/2021 with clinical concern for pain. Mammogram was negative for malignancy.  She reports that pain has now resolved.  She gets regular massage therapy for chronic lumbar pain/synovial cyst with benefit, has seen neurosurgery Dr. Carloyn Manner and had lumbar surgery with resection. Managing with OTC analgesics.   She has cystic acne and folliculitis secondary to PCOS/hirstutism, gets electrolysis which improves significantly. Also uses doxycycline 100 mg PRN.  she has a diagnosis of anxiety and is currently on prozac 20 mg daily, xanax 0.25 mg PRN, reports symptoms are well controlled on current regimen. She reports she uses benzo rarely, typically when flying.   She has a diagnosis of GERD which is currently managed by omeprazole 20 mg due to GERD with hiatal hernia, has failed attempt to taper off of PPI.   BMI is Body mass index is 29.17 kg/m., she has not been working on diet and exercise. Wt Readings from Last 3 Encounters:  11/25/21 154 lb 6.4 oz  (70 kg)  08/04/21 148 lb 6.4 oz (67.3 kg)  06/24/21 149 lb (67.6 kg)   Her blood pressure has been controlled at home, today their BP is BP: 98/70 She does workout. She denies chest pain, shortness of breath, dizziness.   She is on cholesterol medication (nexlizet - myalgias/brain fog with even low dose statin) and denies myalgias. Her cholesterol is at goal. The cholesterol last visit was:   Lab Results  Component Value Date   CHOL 198 05/13/2021   HDL 59 05/13/2021   LDLCALC 109 (H) 05/13/2021   TRIG 181 (H) 05/13/2021   CHOLHDL 3.4 05/13/2021   She takes metformin 500 mg for hx of insulin resistance and for weight. Last A1C in the office was:  Lab Results  Component Value Date   HGBA1C 5.4 11/19/2020   Last GFR: Lab Results  Component Value Date   GFRNONAA 87 05/16/2020   Patient is on Vitamin D supplement, has reduced to once weekly.  Lab Results  Component Value Date   VD25OH 76 11/19/2020     She is on thyroid medication. Her medication was not changed last visit.  She is taking 75 mcg, 1/2 tab M/F and whole tab all other days.  Lab Results  Component Value Date   TSH 0.27 (L) 08/04/2021     Current Medications:  Current Outpatient Medications on File Prior to Visit  Medication Sig Dispense Refill   acetaminophen (TYLENOL) 500 MG tablet Take 1 tablet (500 mg total) by mouth every  6 (six) hours as needed for mild pain, fever or headache. 30 tablet    albuterol (VENTOLIN HFA) 108 (90 Base) MCG/ACT inhaler Inhale 2 puffs into the lungs every 6 (six) hours as needed for wheezing or shortness of breath. 18 g 2   ALPRAZolam (XANAX) 0.25 MG tablet Take 1 tablet (0.25 mg total) by mouth 2 (two) times daily as needed for anxiety. 60 tablet 0   Bempedoic Acid-Ezetimibe (NEXLIZET) 180-10 MG TABS TAKE 1 TABLET BY MOUTH DAILY. 90 tablet 3   Bioflavonoid Products (PERIDRIN-C) 200-50-150 MG TABS Take 2 tablets by mouth daily.      cetirizine (ZYRTEC) 10 MG tablet Take 10 mg by  mouth daily.     doxycycline (VIBRAMYCIN) 100 MG capsule Take 1 capsule by mouth daily with food for acne 90 capsule 0   FLUoxetine (PROZAC) 20 MG capsule TAKE 1 CAPSULE BY MOUTH TWO TIMES DAILY FOR MOOD 180 capsule 1   levothyroxine (SYNTHROID) 75 MCG tablet Take 1/2 tab daily except whole tab Mon/Thurs. Take on an empty stomach with water only. 90 tablet 3   linaclotide (LINZESS) 145 MCG CAPS capsule Take 1 capsule (145 mcg total) by mouth daily for constipation. 90 capsule 3   metFORMIN (GLUCOPHAGE-XR) 500 MG 24 hr tablet Take 1 tablet by mouth daily for Insulin Resistance & Weight 90 tablet 3   mometasone (NASONEX) 50 MCG/ACT nasal spray Place 2 sprays into the nose daily.     omeprazole (PRILOSEC) 40 MG capsule Take 1 capsule (40 mg total) by mouth 2 (two) times daily. Best to take on an empty stomach 20 - 30 mins before food. (Patient taking differently: Take 40 mg by mouth daily. Best to take on an empty stomach 20 - 30 mins before food.) 60 capsule 11   promethazine-dextromethorphan (PROMETHAZINE-DM) 6.25-15 MG/5ML syrup Take 5 mLs by mouth 4 times daily as needed for cough. 240 mL 1   scopolamine (TRANSDERM-SCOP) 1 MG/3DAYS Place 1 patch onto the skin every 3 (three) days as needed for nausea/sea sickness. 4 patch 0   Vitamin D, Ergocalciferol, (DRISDOL) 1.25 MG (50000 UNIT) CAPS capsule Take 1 capsule by mouth once a week 12 capsule 3   levothyroxine (SYNTHROID) 75 MCG tablet TAKE 1 TABLET BT MOUTH ONCE DAILY ON AN EMPTY STOMACH WITH ONLY WATER FOR 30 MINUTES. NO ANTACID MEDS, CALCIUM OR MAGNESIUM FOR 4 HOURS & AVOID BIOTIN 90 tablet 3   predniSONE (DELTASONE) 20 MG tablet Take 2 tablets by mouth daily for 3 days, then 1 tablet daily for 4 days. 10 tablet 0   No current facility-administered medications on file prior to visit.   Allergies:  Allergies  Allergen Reactions   Advicor [Niacin-Lovastatin Er] Swelling    Facial swelling   Niacin-Lovastatin Er Swelling and Other (See Comments)     Extreme flushing   Rosuvastatin Other (See Comments)    Myalgias and brain fog even at low dose   Medical History:  She has Anxiety; Hyperlipidemia; Thyroid disease; History of prediabetes; Chronic back pain; Acne; Vitamin D deficiency; Environmental allergies; GERD (gastroesophageal reflux disease); Overweight (BMI 25.0-29.9); Statin myopathy; Fatty liver; Hiatal hernia; and History of adenomatous polyp of colon on their problem list.   Health Maintenance:   Immunization History  Administered Date(s) Administered   DTaP 01/27/2003   Influenza Inj Mdck Quad With Preservative 11/02/2019   Influenza-Unspecified 10/26/2012, 10/27/2018, 11/11/2020, 11/17/2021   PFIZER(Purple Top)SARS-COV-2 Vaccination 01/24/2019, 02/14/2019, 11/25/2019   Tdap 07/26/2013   Tetanus: 2015 Pneumovax: Prevnar 13:  Flu vaccine: 10/2021 at work Shingrix: declines for now Covid 19: 2/2, 2021, pfizer  LMP: No LMP recorded. Patient is postmenopausal. Pap: 01/2021 Negative Dr. Gertie Fey MGM: 08/2021   Colonoscopy: 09/2020, Dr. Fuller Plan, adenomatous polyps, 7 year recall  EGD: 09/2020, esophagitis  Last vision exam: wears glasses, Due 11/2021 Last dental exam: goes q24m 2023   Patient Care Team: MUnk Pinto MD as PCP - General (Internal Medicine) SLadene Artist MD as Consulting Physician (Gastroenterology) TEverlene Farrier MD as Consulting Physician (Obstetrics and Gynecology) REmelia Loron MD as Referring Physician (Neurosurgery)  Surgical History:  She has a past surgical history that includes Synovial cyst excision (2010); Brain meningioma excision (1965); Tonsillectomy; wisom teeth (1984); Diagnostic laparoscopy; Breast excisional biopsy (Left); and Breast lumpectomy (Left). Family History:  Herfamily history includes Breast cancer in her paternal aunt; Cancer in her maternal grandfather and paternal aunt; Dementia in her mother; Diabetes in her father; Endometrial cancer in her sister; Heart disease  in her father; Hyperlipidemia in her brother and father; Hypertension in her brother and father; Osteoporosis in her mother; Parkinson's disease in her father; Stroke in her father. Social History:  She reports that she has never smoked. She has never used smokeless tobacco. She reports current alcohol use. She reports that she does not use drugs.  Review of Systems: Review of Systems  Constitutional:  Negative for malaise/fatigue and weight loss.  HENT:  Negative for hearing loss and tinnitus.   Eyes:  Negative for blurred vision and double vision.  Respiratory:  Negative for cough, sputum production, shortness of breath and wheezing.   Cardiovascular:  Negative for chest pain, palpitations, orthopnea, claudication, leg swelling and PND.  Gastrointestinal:  Negative for abdominal pain, blood in stool, constipation, diarrhea, heartburn, melena, nausea and vomiting.  Genitourinary: Negative.   Musculoskeletal:  Positive for back pain (lumbar, intermittent, worse after work). Negative for falls, joint pain and myalgias.  Skin:  Negative for rash.  Neurological:  Negative for dizziness, tingling, sensory change, weakness and headaches.  Endo/Heme/Allergies:  Negative for polydipsia.  Psychiatric/Behavioral: Negative.  Negative for depression, memory loss, substance abuse and suicidal ideas. The patient is not nervous/anxious and does not have insomnia.   All other systems reviewed and are negative.   Physical Exam: Estimated body mass index is 29.17 kg/m as calculated from the following:   Height as of this encounter: _0  (1.549 m).   Weight as of this encounter: 154 lb 6.4 oz (70 kg). BP 98/70   Pulse 71   Temp (!) 97.5 F (36.4 C)   Ht _1  (1.549 m)   Wt 154 lb 6.4 oz (70 kg)   SpO2 99%   BMI 29.17 kg/m  General Appearance: Well nourished, in no apparent distress.  Eyes: PERRLA, EOMs, conjunctiva no swelling or erythema Sinuses: No Frontal/maxillary tenderness  ENT/Mouth: Ext  aud canals clear, normal light reflex with TMs without erythema, bulging. Good dentition. No erythema, swelling, or exudate on post pharynx. Tonsils not swollen or erythematous. Hearing normal.  Neck: Supple, thyroid normal. No bruits  Respiratory: Respiratory effort normal, BS equal bilaterally without rales, rhonchi, wheezing or stridor.  Cardio: RRR without murmurs, rubs or gallops. Brisk peripheral pulses without edema.  Chest: symmetric, with normal excursions and percussion.  Breasts: Defer to GYN Abdomen: Soft, nontender, no guarding, rebound, hernias, masses, or organomegaly.  Lymphatics: Non tender without lymphadenopathy.  Genitourinary: Defer to GYN Musculoskeletal: Full ROM all peripheral extremities,5/5 strength, and normal gait.  Skin: Warm, dry without  rashes, lesions, ecchymosis. Neuro: Cranial nerves intact, reflexes equal bilaterally. Normal muscle tone, no cerebellar symptoms. Sensation intact.  Psych: Awake and oriented X 3, normal affect, Insight and Judgment appropriate.   EKG: NSR no ST changes.  Bayani Renteria 2:31 PM [facility]

## 2021-11-27 ENCOUNTER — Encounter: Payer: Self-pay | Admitting: Nurse Practitioner

## 2021-11-27 LAB — MICROALBUMIN / CREATININE URINE RATIO
Creatinine, Urine: 21 mg/dL (ref 20–275)
Microalb, Ur: <0.2 mg/dL

## 2021-11-27 LAB — CBC WITH DIFFERENTIAL/PLATELET
Absolute Monocytes: 292 cells/uL (ref 200–950)
Basophils Absolute: 31 cells/uL (ref 0–200)
Basophils Relative: 0.9 %
Eosinophils Absolute: 71 cells/uL (ref 15–500)
Eosinophils Relative: 2.1 %
HCT: 36.1 % (ref 35.0–45.0)
Hemoglobin: 12.6 g/dL (ref 11.7–15.5)
Lymphs Abs: 1326 cells/uL (ref 850–3900)
MCH: 32.3 pg (ref 27.0–33.0)
MCHC: 34.9 g/dL (ref 32.0–36.0)
MCV: 92.6 fL (ref 80.0–100.0)
MPV: 9.7 fL (ref 7.5–12.5)
Monocytes Relative: 8.6 %
Neutro Abs: 1680 cells/uL (ref 1500–7800)
Neutrophils Relative %: 49.4 %
Platelets: 342 10*3/uL (ref 140–400)
RBC: 3.9 10*6/uL (ref 3.80–5.10)
RDW: 11.8 % (ref 11.0–15.0)
Total Lymphocyte: 39 %
WBC: 3.4 10*3/uL — ABNORMAL LOW (ref 3.8–10.8)

## 2021-11-27 LAB — LIPID PANEL
Cholesterol: 203 mg/dL — ABNORMAL HIGH (ref <200)
HDL: 52 mg/dL (ref >=50)
LDL Cholesterol (Calc): 115 mg/dL (calc) — ABNORMAL HIGH
Non-HDL Cholesterol (Calc): 151 mg/dL (calc) — ABNORMAL HIGH (ref <130)
Total CHOL/HDL Ratio: 3.9 (calc) (ref <5.0)
Triglycerides: 238 mg/dL — ABNORMAL HIGH (ref <150)

## 2021-11-27 LAB — COMPLETE METABOLIC PANEL WITH GFR
AG Ratio: 2.2 (calc) (ref 1.0–2.5)
ALT: 21 U/L (ref 6–29)
AST: 29 U/L (ref 10–35)
Albumin: 4.8 g/dL (ref 3.6–5.1)
Alkaline phosphatase (APISO): 62 U/L (ref 37–153)
BUN: 10 mg/dL (ref 7–25)
CO2: 30 mmol/L (ref 20–32)
Calcium: 9.7 mg/dL (ref 8.6–10.4)
Chloride: 98 mmol/L (ref 98–110)
Creat: 0.88 mg/dL (ref 0.50–1.05)
Globulin: 2.2 g/dL (calc) (ref 1.9–3.7)
Glucose, Bld: 79 mg/dL (ref 65–99)
Potassium: 4.2 mmol/L (ref 3.5–5.3)
Sodium: 136 mmol/L (ref 135–146)
Total Bilirubin: 0.4 mg/dL (ref 0.2–1.2)
Total Protein: 7 g/dL (ref 6.1–8.1)
eGFR: 75 mL/min/{1.73_m2} (ref >=60)

## 2021-11-27 LAB — URINALYSIS, ROUTINE W REFLEX MICROSCOPIC
Bilirubin Urine: NEGATIVE
Glucose, UA: NEGATIVE
Hgb urine dipstick: NEGATIVE
Ketones, ur: NEGATIVE
Leukocytes,Ua: NEGATIVE
Nitrite: NEGATIVE
Protein, ur: NEGATIVE
Specific Gravity, Urine: 1.005 (ref 1.001–1.035)
pH: 6 (ref 5.0–8.0)

## 2021-11-27 LAB — HEMOGLOBIN A1C
Hgb A1c MFr Bld: 5.7 % of total Hgb — ABNORMAL HIGH (ref <5.7)
Mean Plasma Glucose: 117 mg/dL
eAG (mmol/L): 6.5 mmol/L

## 2021-11-27 LAB — MAGNESIUM: Magnesium: 2 mg/dL (ref 1.5–2.5)

## 2021-11-27 LAB — TSH: TSH: 2.79 mIU/L (ref 0.40–4.50)

## 2021-11-27 LAB — VITAMIN D 25 HYDROXY (VIT D DEFICIENCY, FRACTURES): Vit D, 25-Hydroxy: 63 ng/mL (ref 30–100)

## 2021-11-27 LAB — INSULIN, RANDOM: Insulin: 11.1 u[IU]/mL

## 2021-12-09 DIAGNOSIS — S0285XA Fracture of orbit, unspecified, initial encounter for closed fracture: Secondary | ICD-10-CM | POA: Diagnosis not present

## 2021-12-09 DIAGNOSIS — D3132 Benign neoplasm of left choroid: Secondary | ICD-10-CM | POA: Diagnosis not present

## 2022-01-13 ENCOUNTER — Other Ambulatory Visit: Payer: Self-pay

## 2022-01-13 ENCOUNTER — Other Ambulatory Visit (HOSPITAL_COMMUNITY): Payer: Self-pay

## 2022-01-14 DIAGNOSIS — D3132 Benign neoplasm of left choroid: Secondary | ICD-10-CM | POA: Diagnosis not present

## 2022-01-22 ENCOUNTER — Other Ambulatory Visit (HOSPITAL_COMMUNITY): Payer: Self-pay

## 2022-02-03 ENCOUNTER — Other Ambulatory Visit: Payer: Self-pay

## 2022-02-06 ENCOUNTER — Other Ambulatory Visit (HOSPITAL_COMMUNITY): Payer: Self-pay

## 2022-02-06 ENCOUNTER — Other Ambulatory Visit: Payer: Self-pay | Admitting: Internal Medicine

## 2022-02-09 ENCOUNTER — Other Ambulatory Visit (HOSPITAL_COMMUNITY): Payer: Self-pay

## 2022-02-09 MED ORDER — VITAMIN D (ERGOCALCIFEROL) 1.25 MG (50000 UNIT) PO CAPS
ORAL_CAPSULE | ORAL | 3 refills | Status: DC
Start: 2022-02-09 — End: 2023-01-21
  Filled 2022-02-09: qty 12, 84d supply, fill #0
  Filled 2022-05-03: qty 12, 84d supply, fill #1
  Filled 2022-08-06 (×2): qty 12, 84d supply, fill #2
  Filled 2022-10-30: qty 12, 84d supply, fill #3

## 2022-02-10 ENCOUNTER — Other Ambulatory Visit: Payer: Self-pay

## 2022-02-23 ENCOUNTER — Telehealth: Payer: Self-pay | Admitting: Nurse Practitioner

## 2022-02-23 ENCOUNTER — Other Ambulatory Visit (HOSPITAL_COMMUNITY): Payer: Self-pay

## 2022-02-23 DIAGNOSIS — L709 Acne, unspecified: Secondary | ICD-10-CM

## 2022-02-23 MED ORDER — FLUOXETINE HCL 20 MG PO CAPS
ORAL_CAPSULE | ORAL | 1 refills | Status: DC
  Filled 2022-02-23: qty 180, 90d supply, fill #0
  Filled 2022-10-02: qty 180, 90d supply, fill #1

## 2022-02-23 MED ORDER — DOXYCYCLINE HYCLATE 100 MG PO CAPS
ORAL_CAPSULE | ORAL | 0 refills | Status: DC
  Filled 2022-02-23: qty 90, 90d supply, fill #0

## 2022-02-23 NOTE — Addendum Note (Signed)
Addended by: Chancy Hurter on: 02/23/2022 04:38 PM   Modules accepted: Orders

## 2022-02-23 NOTE — Telephone Encounter (Signed)
Patient is requesting refills on Doxycycline and Fluoxetine to [facility].

## 2022-04-01 ENCOUNTER — Other Ambulatory Visit (HOSPITAL_COMMUNITY): Payer: Self-pay

## 2022-04-02 ENCOUNTER — Other Ambulatory Visit (HOSPITAL_COMMUNITY): Payer: Self-pay

## 2022-04-17 ENCOUNTER — Other Ambulatory Visit (HOSPITAL_COMMUNITY): Payer: Self-pay

## 2022-04-18 ENCOUNTER — Other Ambulatory Visit (HOSPITAL_COMMUNITY): Payer: Self-pay

## 2022-05-05 ENCOUNTER — Other Ambulatory Visit: Payer: Self-pay | Admitting: Internal Medicine

## 2022-05-05 ENCOUNTER — Other Ambulatory Visit (HOSPITAL_COMMUNITY): Payer: Self-pay

## 2022-05-05 MED ORDER — METFORMIN HCL ER 500 MG PO TB24
ORAL_TABLET | ORAL | 3 refills | Status: DC
Start: 2022-05-05 — End: 2023-04-09
  Filled 2022-05-05: qty 90, 90d supply, fill #0
  Filled 2022-08-06 (×2): qty 90, 90d supply, fill #1
  Filled 2022-10-30: qty 90, 90d supply, fill #2
  Filled 2023-01-21: qty 90, 90d supply, fill #3

## 2022-05-20 ENCOUNTER — Other Ambulatory Visit (HOSPITAL_COMMUNITY): Payer: Self-pay

## 2022-06-29 ENCOUNTER — Other Ambulatory Visit: Payer: Self-pay

## 2022-08-06 ENCOUNTER — Other Ambulatory Visit (HOSPITAL_COMMUNITY): Payer: Self-pay

## 2022-08-15 ENCOUNTER — Other Ambulatory Visit: Payer: Self-pay | Admitting: Nurse Practitioner

## 2022-08-15 DIAGNOSIS — L709 Acne, unspecified: Secondary | ICD-10-CM

## 2022-08-16 ENCOUNTER — Other Ambulatory Visit: Payer: Self-pay

## 2022-08-17 DIAGNOSIS — Z6828 Body mass index (BMI) 28.0-28.9, adult: Secondary | ICD-10-CM | POA: Diagnosis not present

## 2022-08-17 DIAGNOSIS — N841 Polyp of cervix uteri: Secondary | ICD-10-CM | POA: Diagnosis not present

## 2022-08-17 DIAGNOSIS — Z01419 Encounter for gynecological examination (general) (routine) without abnormal findings: Secondary | ICD-10-CM | POA: Diagnosis not present

## 2022-08-17 DIAGNOSIS — N952 Postmenopausal atrophic vaginitis: Secondary | ICD-10-CM | POA: Diagnosis not present

## 2022-08-18 LAB — RESULTS CONSOLE HPV: CHL HPV: NEGATIVE

## 2022-08-18 LAB — HM PAP SMEAR: HM Pap smear: NEGATIVE

## 2022-08-19 DIAGNOSIS — D3132 Benign neoplasm of left choroid: Secondary | ICD-10-CM | POA: Diagnosis not present

## 2022-08-20 ENCOUNTER — Other Ambulatory Visit (HOSPITAL_COMMUNITY): Payer: Self-pay

## 2022-08-20 ENCOUNTER — Other Ambulatory Visit: Payer: Self-pay | Admitting: Adult Health

## 2022-08-21 ENCOUNTER — Other Ambulatory Visit (HOSPITAL_COMMUNITY): Payer: Self-pay

## 2022-08-21 ENCOUNTER — Other Ambulatory Visit: Payer: Self-pay | Admitting: Nurse Practitioner

## 2022-08-21 ENCOUNTER — Telehealth: Payer: Self-pay | Admitting: Nurse Practitioner

## 2022-08-21 ENCOUNTER — Other Ambulatory Visit: Payer: Self-pay

## 2022-08-21 DIAGNOSIS — K5909 Other constipation: Secondary | ICD-10-CM

## 2022-08-21 DIAGNOSIS — L709 Acne, unspecified: Secondary | ICD-10-CM

## 2022-08-21 MED ORDER — LINACLOTIDE 145 MCG PO CAPS
145.0000 ug | ORAL_CAPSULE | Freq: Every day | ORAL | 3 refills | Status: DC
  Filled 2022-08-21: qty 90, 90d supply, fill #0

## 2022-08-21 MED ORDER — LINACLOTIDE 145 MCG PO CAPS
145.0000 ug | ORAL_CAPSULE | Freq: Every day | ORAL | 3 refills | Status: DC
  Filled 2022-08-21: qty 90, 90d supply, fill #0
  Filled 2022-11-15: qty 90, 90d supply, fill #1
  Filled 2023-02-26: qty 90, 90d supply, fill #2
  Filled 2023-06-01: qty 90, 90d supply, fill #3

## 2022-08-21 MED ORDER — DOXYCYCLINE HYCLATE 100 MG PO CAPS
ORAL_CAPSULE | ORAL | 0 refills | Status: DC
  Filled 2022-08-21: qty 90, 90d supply, fill #0

## 2022-08-21 NOTE — Telephone Encounter (Signed)
Patient is requesting refills on Linzess and Doxycycline (she takes periodically for Acne) to [facility] long outpatient pharmacy

## 2022-09-07 ENCOUNTER — Encounter: Payer: Self-pay | Admitting: Nurse Practitioner

## 2022-09-07 ENCOUNTER — Ambulatory Visit: Payer: Commercial Managed Care - PPO | Admitting: Nurse Practitioner

## 2022-09-07 VITALS — BP 102/66 | HR 61 | Temp 97.6°F | Ht 61.0 in | Wt 150.0 lb

## 2022-09-07 DIAGNOSIS — Z79899 Other long term (current) drug therapy: Secondary | ICD-10-CM

## 2022-09-07 DIAGNOSIS — E559 Vitamin D deficiency, unspecified: Secondary | ICD-10-CM | POA: Diagnosis not present

## 2022-09-07 DIAGNOSIS — M545 Low back pain, unspecified: Secondary | ICD-10-CM | POA: Diagnosis not present

## 2022-09-07 DIAGNOSIS — Z87898 Personal history of other specified conditions: Secondary | ICD-10-CM

## 2022-09-07 DIAGNOSIS — E079 Disorder of thyroid, unspecified: Secondary | ICD-10-CM

## 2022-09-07 DIAGNOSIS — E782 Mixed hyperlipidemia: Secondary | ICD-10-CM | POA: Diagnosis not present

## 2022-09-07 DIAGNOSIS — L709 Acne, unspecified: Secondary | ICD-10-CM | POA: Diagnosis not present

## 2022-09-07 DIAGNOSIS — G720 Drug-induced myopathy: Secondary | ICD-10-CM | POA: Diagnosis not present

## 2022-09-07 DIAGNOSIS — G8929 Other chronic pain: Secondary | ICD-10-CM

## 2022-09-07 DIAGNOSIS — K219 Gastro-esophageal reflux disease without esophagitis: Secondary | ICD-10-CM

## 2022-09-07 DIAGNOSIS — D708 Other neutropenia: Secondary | ICD-10-CM

## 2022-09-07 DIAGNOSIS — E663 Overweight: Secondary | ICD-10-CM

## 2022-09-07 DIAGNOSIS — F419 Anxiety disorder, unspecified: Secondary | ICD-10-CM

## 2022-09-07 DIAGNOSIS — K760 Fatty (change of) liver, not elsewhere classified: Secondary | ICD-10-CM

## 2022-09-07 DIAGNOSIS — T466X5A Adverse effect of antihyperlipidemic and antiarteriosclerotic drugs, initial encounter: Secondary | ICD-10-CM

## 2022-09-07 DIAGNOSIS — Z9109 Other allergy status, other than to drugs and biological substances: Secondary | ICD-10-CM

## 2022-09-07 NOTE — Patient Instructions (Signed)

## 2022-09-07 NOTE — Progress Notes (Signed)
Follow Up  Assessment and Plan:  Rittie was seen today for a follow up  Diagnoses and all orders for this visit:  Gastroesophageal reflux disease, esophagitis presence not specified Hiatal hernia Continue Omeprazole No suspected reflux complications (Barret/stricture). Lifestyle modification:  wt loss, avoid meals 2-3h before bedtime. Consider eliminating food triggers:  chocolate, caffeine, EtOH, acid/spicy food.   Fatty liver Improved/Resolved Continue to monitor  Thyroid disease Continue Levothyroxine. Dose to be changed based off of most current result Reminded to take on an empty stomach 30-15mins before food.  Stop any Biotin Supplement 48-72 hours before next TSH level to reduce the risk of falsely low TSH levels. Continue to monitor.     History of prediabetes Education: Reviewed 'ABCs' of diabetes management  Discussed goals to be met and/or maintained include A1C (<7) Blood pressure (<130/80) Cholesterol (LDL <70) Continue Eye Exam yearly  Continue Dental Exam Q6 mo Discussed dietary recommendations Discussed Physical Activity recommendations Check A1C   Acne, unspecified acne type Improved with doxycycline 100 mg daily Hygiene reviewed  Anxiety Well managed by current regimen; continue medications Stress management techniques discussed, increase water, good sleep hygiene discussed, increase exercise, and increase veggies.   Mixed hyperlipidemia/ statin myopathy  Discussed lifestyle modifications. Recommended diet heavy in fruits and veggies, omega 3's. Decrease consumption of animal meats, cheeses, and dairy products. Remain active and exercise as tolerated. Continue to monitor. Check lipids/TSH   Chronic low back pain without sciatica, unspecified back pain laterality Monitor, managing with OTC analgesics 2010 - PT Completed - Has reasonable accomodation.  Vitamin D deficiency Continue supplement Monitor levels  Environmental  allergies Continue OTC antihistamine Avoid triggers  Overweight (BMI 25.0-29.9) Discussed appropriate BMI Diet modification. Physical activity. Encouraged/praised to build confidence.   Orders Placed This Encounter  Procedures   CBC with Differential/Platelet   COMPLETE METABOLIC PANEL WITH GFR   Lipid panel   Hemoglobin A1c   TSH   Notify office for further evaluation and treatment, questions or concerns if any reported s/s fail to improve.   The patient was advised to call back or seek an in-person evaluation if any symptoms worsen or if the condition fails to improve as anticipated.   Further disposition pending results of labs. Discussed med's effects and SE's.    I discussed the assessment and treatment plan with the patient. The patient was provided an opportunity to ask questions and all were answered. The patient agreed with the plan and demonstrated an understanding of the instructions.  Discussed med's effects and SE's. Screening labs and tests as requested with regular follow-up as recommended.  I provided 25 minutes of face-to-face time during this encounter including counseling, chart review, and critical decision making was preformed.   Future Appointments  Date Time Provider Department Center  11/23/2022  2:00 PM Adela Glimpse, NP [facility] None      HPI  62 y.o. female  presents for a general follow up for has Anxiety; Hyperlipidemia; Thyroid disease; History of prediabetes; Chronic back pain; Acne; Vitamin D deficiency; Environmental allergies; GERD (gastroesophageal reflux disease); Overweight (BMI 25.0-29.9); Statin myopathy; Fatty liver; Hiatal hernia; and History of adenomatous polyp of colon on their problem list.    Single, no kids. 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   She had a fall on 11/10/21 after a pickle ball accident.  Went to [facility] ER  and was noted to have a left side of facial  depressed fracture through the floor of the left orbit, She has a f/u with Opth on 12/09/21.  Healing well.   No concerns today.  Follows Dr. Henderson Cloud GYN annually.  Had mammogram 08/2021 with clinical concern for pain. Mammogram was negative for malignancy.  She reports that pain has now resolved.  She gets regular massage therapy for chronic lumbar pain/synovial cyst with benefit, has seen neurosurgery Dr. Channing Mutters and had lumbar surgery with resection. Managing with OTC analgesics.   She has cystic acne and folliculitis secondary to PCOS/hirstutism, gets electrolysis which improves significantly. Also uses doxycycline 100 mg PRN.  She has a diagnosis of anxiety and is currently on prozac 20 mg daily, xanax 0.25 mg PRN, reports symptoms are well controlled on current regimen. She reports she uses benzo rarely, typically when flying.   She has a diagnosis of GERD which is currently managed by omeprazole 20 mg due to GERD with hiatal hernia, has failed attempt to taper off of PPI.   BMI is Body mass index is 28.34 kg/m., she has not been working on diet and exercise. Wt Readings from Last 3 Encounters:  09/07/22 150 lb (68 kg)  11/25/21 154 lb 6.4 oz (70 kg)  08/04/21 148 lb 6.4 oz (67.3 kg)   Her blood pressure has been controlled at home, today their BP is BP: 102/66 She does workout. She denies chest pain, shortness of breath, dizziness.   She is on cholesterol medication (nexlizet - myalgias/brain fog with even low dose statin) and denies myalgias. Her cholesterol is at goal. The cholesterol last visit was:   Lab Results  Component Value Date   CHOL 203 (H) 11/25/2021   HDL 52 11/25/2021   LDLCALC 115 (H) 11/25/2021   TRIG 238 (H) 11/25/2021   CHOLHDL 3.9 11/25/2021   She takes metformin 500 mg for hx of insulin resistance and for weight. Last A1C in the office was:  Lab Results  Component Value Date   HGBA1C 5.7 (H) 11/25/2021   Last  GFR: Lab Results  Component Value Date   GFRNONAA 87 05/16/2020   Patient is on Vitamin D supplement, has reduced to once weekly.  Lab Results  Component Value Date   VD25OH 13 11/25/2021     She is on thyroid medication. Her medication was not changed last visit.  She is taking 75 mcg, 1/2 tab M/F and whole tab all other days.  Lab Results  Component Value Date   TSH 2.79 11/25/2021   Current Medications:  Current Outpatient Medications on File Prior to Visit  Medication Sig Dispense Refill   acetaminophen (TYLENOL) 500 MG tablet Take 1 tablet (500 mg total) by mouth every 6 (six) hours as needed for mild pain, fever or headache. 30 tablet    ALPRAZolam (XANAX) 0.25 MG tablet Take 1 tablet (0.25 mg total) by mouth 2 (two) times daily as needed for anxiety. 60 tablet 0   Bempedoic Acid-Ezetimibe (NEXLIZET) 180-10 MG TABS Take 1 tablet by mouth daily. 90 tablet 3   Bioflavonoid Products (PERIDRIN-C) 200-50-150 MG TABS Take 2 tablets by mouth daily.      cetirizine (ZYRTEC) 10 MG tablet Take 10 mg by mouth daily.     doxycycline (VIBRAMYCIN) 100 MG capsule Take 1 capsule by mouth with food once daily for acne 90 capsule 0   FLUoxetine (PROZAC) 20 MG capsule TAKE 1 CAPSULE BY MOUTH 2 TIMES DAILY FOR MOOD 180 capsule 1   linaclotide (LINZESS) 145 MCG CAPS capsule Take 1  capsule (145 mcg total) by mouth daily for constipation. 90 capsule 3   metFORMIN (GLUCOPHAGE-XR) 500 MG 24 hr tablet Take 1 tablet by mouth daily for Insulin Resistance & Weight 90 tablet 3   mometasone (NASONEX) 50 MCG/ACT nasal spray Place 2 sprays into the nose daily.     omeprazole (PRILOSEC) 40 MG capsule Take 1 capsule (40 mg total) by mouth daily. Best to take on an empty stomach 20 - 30 mins before food. 90 capsule 3   Vitamin D, Ergocalciferol, (DRISDOL) 1.25 MG (50000 UNIT) CAPS capsule Take 1 capsule by mouth once a week 12 capsule 3   albuterol (VENTOLIN HFA) 108 (90 Base) MCG/ACT inhaler Inhale 2 puffs into the  lungs every 6 (six) hours as needed for wheezing or shortness of breath. (Patient not taking: Reported on 09/07/2022) 18 g 2   levothyroxine (SYNTHROID) 75 MCG tablet Take 1/2 tab daily except whole tab Mon/Thurs. Take on an empty stomach with water only. (Patient taking differently: Taking a whole tablet three days a week(M,Th,Sat) and 1/2 tablet all other days) 90 tablet 3   levothyroxine (SYNTHROID) 75 MCG tablet TAKE 1 TABLET BT MOUTH ONCE DAILY ON AN EMPTY STOMACH WITH ONLY WATER FOR 30 MINUTES. NO ANTACID MEDS, CALCIUM OR MAGNESIUM FOR 4 HOURS & AVOID BIOTIN 90 tablet 3   predniSONE (DELTASONE) 20 MG tablet Take 2 tablets by mouth daily for 3 days, then 1 tablet daily for 4 days. 10 tablet 0   promethazine-dextromethorphan (PROMETHAZINE-DM) 6.25-15 MG/5ML syrup Take 5 mLs by mouth 4 times daily as needed for cough. 240 mL 1   scopolamine (TRANSDERM-SCOP) 1 MG/3DAYS Place 1 patch onto the skin every 3 (three) days as needed for nausea/sea sickness. 4 patch 0   No current facility-administered medications on file prior to visit.   Allergies:  Allergies  Allergen Reactions   Advicor [Niacin-Lovastatin Er] Swelling    Facial swelling   Niacin-Lovastatin Er Swelling and Other (See Comments)    Extreme flushing   Rosuvastatin Other (See Comments)    Myalgias and brain fog even at low dose   Medical History:  She has Anxiety; Hyperlipidemia; Thyroid disease; History of prediabetes; Chronic back pain; Acne; Vitamin D deficiency; Environmental allergies; GERD (gastroesophageal reflux disease); Overweight (BMI 25.0-29.9); Statin myopathy; Fatty liver; Hiatal hernia; and History of adenomatous polyp of colon on their problem list.   Health Maintenance:   Immunization History  Administered Date(s) Administered   DTaP 01/27/2003   Influenza Inj Mdck Quad With Preservative 11/02/2019   Influenza-Unspecified 10/26/2012, 10/27/2018, 11/11/2020, 11/17/2021   PFIZER(Purple Top)SARS-COV-2 Vaccination  01/24/2019, 02/14/2019, 11/25/2019   Tdap 07/26/2013   Patient Care Team: Lucky Cowboy, MD as PCP - General (Internal Medicine) Meryl Dare, MD as Consulting Physician (Gastroenterology) Harold Hedge, MD as Consulting Physician (Obstetrics and Gynecology) Emeline General, MD as Referring Physician (Neurosurgery)  Surgical History:  She has a past surgical history that includes Synovial cyst excision (2010); Brain meningioma excision (1965); Tonsillectomy; wisom teeth (1984); Diagnostic laparoscopy; Breast excisional biopsy (Left); and Breast lumpectomy (Left). Family History:  Herfamily history includes Breast cancer in her paternal aunt; Cancer in her maternal grandfather and paternal aunt; Dementia in her mother; Diabetes in her father; Endometrial cancer in her sister; Heart disease in her father; Hyperlipidemia in her brother and father; Hypertension in her brother and father; Osteoporosis in her mother; Parkinson's disease in her father; Stroke in her father. Social History:  She reports that she has never smoked. She has never  used smokeless tobacco. She reports current alcohol use. She reports that she does not use drugs.  Review of Systems: Review of Systems  Constitutional:  Negative for malaise/fatigue and weight loss.  HENT:  Negative for hearing loss and tinnitus.   Eyes:  Negative for blurred vision and double vision.  Respiratory:  Negative for cough, sputum production, shortness of breath and wheezing.   Cardiovascular:  Negative for chest pain, palpitations, orthopnea, claudication, leg swelling and PND.  Gastrointestinal:  Negative for abdominal pain, blood in stool, constipation, diarrhea, heartburn, melena, nausea and vomiting.  Genitourinary: Negative.   Musculoskeletal:  Positive for back pain (lumbar, intermittent, worse after work). Negative for falls, joint pain and myalgias.  Skin:  Negative for rash.  Neurological:  Negative for dizziness, tingling, sensory  change, weakness and headaches.  Endo/Heme/Allergies:  Negative for polydipsia.  Psychiatric/Behavioral: Negative.  Negative for depression, memory loss, substance abuse and suicidal ideas. The patient is not nervous/anxious and does not have insomnia.   All other systems reviewed and are negative.   Physical Exam: Estimated body mass index is 28.34 kg/m as calculated from the following:   Height as of this encounter: 5\' 1"  (1.549 m).   Weight as of this encounter: 150 lb (68 kg). BP 102/66   Pulse 61   Temp 97.6 F (36.4 C)   Ht 5\' 1"  (1.549 m)   Wt 150 lb (68 kg)   SpO2 99%   BMI 28.34 kg/m  General Appearance: Well nourished, in no apparent distress.  Eyes: PERRLA, EOMs, conjunctiva no swelling or erythema Sinuses: No Frontal/maxillary tenderness  ENT/Mouth: Ext aud canals clear, normal light reflex with TMs without erythema, bulging. Good dentition. No erythema, swelling, or exudate on post pharynx. Tonsils not swollen or erythematous. Hearing normal.  Neck: Supple, thyroid normal. No bruits  Respiratory: Respiratory effort normal, BS equal bilaterally without rales, rhonchi, wheezing or stridor.  Cardio: RRR without murmurs, rubs or gallops. Brisk peripheral pulses without edema.  Chest: symmetric, with normal excursions and percussion.  Breasts: Defer to GYN Abdomen: Soft, nontender, no guarding, rebound, hernias, masses, or organomegaly.  Lymphatics: Non tender without lymphadenopathy.  Genitourinary: Defer to GYN Musculoskeletal: Full ROM all peripheral extremities,5/5 strength, and normal gait.  Skin: Warm, dry without rashes, lesions, ecchymosis. Neuro: Cranial nerves intact, reflexes equal bilaterally. Normal muscle tone, no cerebellar symptoms. Sensation intact.  Psych: Awake and oriented X 3, normal affect, Insight and Judgment appropriate.   Keiasia Christianson 4:19 PM [facility]

## 2022-10-02 ENCOUNTER — Other Ambulatory Visit: Payer: Self-pay | Admitting: Nurse Practitioner

## 2022-10-02 ENCOUNTER — Other Ambulatory Visit: Payer: Self-pay

## 2022-10-02 DIAGNOSIS — E782 Mixed hyperlipidemia: Secondary | ICD-10-CM

## 2022-10-05 ENCOUNTER — Telehealth: Payer: Self-pay | Admitting: Nurse Practitioner

## 2022-10-05 ENCOUNTER — Other Ambulatory Visit: Payer: Self-pay

## 2022-10-05 ENCOUNTER — Other Ambulatory Visit (HOSPITAL_COMMUNITY): Payer: Self-pay

## 2022-10-05 ENCOUNTER — Telehealth: Payer: Self-pay

## 2022-10-05 DIAGNOSIS — E782 Mixed hyperlipidemia: Secondary | ICD-10-CM

## 2022-10-05 MED ORDER — NEXLIZET 180-10 MG PO TABS
1.0000 | ORAL_TABLET | Freq: Every day | ORAL | 3 refills | Status: AC
  Filled 2022-10-05: qty 90, 90d supply, fill #0
  Filled 2023-01-01: qty 90, 90d supply, fill #1
  Filled 2023-04-09: qty 90, 90d supply, fill #2
  Filled 2023-07-06: qty 90, 90d supply, fill #3

## 2022-10-05 NOTE — Telephone Encounter (Signed)
Requesting refill on Nexlizet. Pls send to [place] pharm on file.

## 2022-10-05 NOTE — Telephone Encounter (Signed)
PA for Nexlizet completed.

## 2022-10-05 NOTE — Addendum Note (Signed)
Addended by: Dionicio Stall on: 10/05/2022 12:33 PM   Modules accepted: Orders

## 2022-10-06 ENCOUNTER — Other Ambulatory Visit (HOSPITAL_COMMUNITY): Payer: Self-pay

## 2022-10-08 ENCOUNTER — Other Ambulatory Visit (HOSPITAL_COMMUNITY): Payer: Self-pay

## 2022-10-20 ENCOUNTER — Other Ambulatory Visit: Payer: Self-pay | Admitting: Internal Medicine

## 2022-10-20 DIAGNOSIS — Z1231 Encounter for screening mammogram for malignant neoplasm of breast: Secondary | ICD-10-CM

## 2022-10-30 ENCOUNTER — Other Ambulatory Visit (HOSPITAL_COMMUNITY): Payer: Self-pay

## 2022-10-30 ENCOUNTER — Ambulatory Visit
Admission: RE | Admit: 2022-10-30 | Discharge: 2022-10-30 | Disposition: A | Discharge status: Home / Self Care | Payer: Commercial Managed Care - PPO | Source: Ambulatory Visit | Attending: Internal Medicine | Admitting: Internal Medicine

## 2022-10-30 ENCOUNTER — Other Ambulatory Visit: Payer: Self-pay | Admitting: Nurse Practitioner

## 2022-10-30 DIAGNOSIS — Z1231 Encounter for screening mammogram for malignant neoplasm of breast: Secondary | ICD-10-CM | POA: Diagnosis not present

## 2022-10-30 DIAGNOSIS — K317 Polyp of stomach and duodenum: Secondary | ICD-10-CM

## 2022-10-30 DIAGNOSIS — K219 Gastro-esophageal reflux disease without esophagitis: Secondary | ICD-10-CM

## 2022-10-30 DIAGNOSIS — Z79899 Other long term (current) drug therapy: Secondary | ICD-10-CM

## 2022-10-30 MED ORDER — OMEPRAZOLE 40 MG PO CPDR
40.0000 mg | DELAYED_RELEASE_CAPSULE | Freq: Every day | ORAL | 3 refills | Status: DC
  Filled 2022-10-30: qty 90, 90d supply, fill #0
  Filled 2023-01-21 – 2023-01-23 (×2): qty 90, 90d supply, fill #1
  Filled 2023-04-27: qty 90, 90d supply, fill #2
  Filled 2023-07-27: qty 90, 90d supply, fill #3

## 2022-11-15 ENCOUNTER — Other Ambulatory Visit: Payer: Self-pay | Admitting: Nurse Practitioner

## 2022-11-15 DIAGNOSIS — L709 Acne, unspecified: Secondary | ICD-10-CM

## 2022-11-16 ENCOUNTER — Other Ambulatory Visit (HOSPITAL_COMMUNITY): Payer: Self-pay

## 2022-11-16 ENCOUNTER — Other Ambulatory Visit: Payer: Self-pay

## 2022-11-16 ENCOUNTER — Other Ambulatory Visit: Payer: Self-pay | Admitting: Nurse Practitioner

## 2022-11-16 ENCOUNTER — Telehealth: Payer: Self-pay | Admitting: Nurse Practitioner

## 2022-11-16 DIAGNOSIS — L709 Acne, unspecified: Secondary | ICD-10-CM

## 2022-11-16 MED ORDER — DOXYCYCLINE HYCLATE 100 MG PO CAPS
100.0000 mg | ORAL_CAPSULE | Freq: Every day | ORAL | 0 refills | Status: DC
  Filled 2022-11-16 (×2): qty 90, 90d supply, fill #0

## 2022-11-16 NOTE — Telephone Encounter (Signed)
Medication refill sent to pharmacy  

## 2022-11-16 NOTE — Telephone Encounter (Signed)
Pt called to tell us doxycycline was denied. Not sure if she means the PA.

## 2022-11-17 ENCOUNTER — Other Ambulatory Visit (HOSPITAL_COMMUNITY): Payer: Self-pay

## 2022-11-23 ENCOUNTER — Encounter: Payer: Self-pay | Admitting: Nurse Practitioner

## 2022-11-23 ENCOUNTER — Ambulatory Visit (INDEPENDENT_AMBULATORY_CARE_PROVIDER_SITE_OTHER): Payer: Commercial Managed Care - PPO | Admitting: Nurse Practitioner

## 2022-11-23 VITALS — BP 132/78 | HR 67 | Temp 97.5°F | Ht 61.0 in | Wt 149.8 lb

## 2022-11-23 DIAGNOSIS — E079 Disorder of thyroid, unspecified: Secondary | ICD-10-CM

## 2022-11-23 DIAGNOSIS — F419 Anxiety disorder, unspecified: Secondary | ICD-10-CM

## 2022-11-23 DIAGNOSIS — I10 Essential (primary) hypertension: Secondary | ICD-10-CM | POA: Diagnosis not present

## 2022-11-23 DIAGNOSIS — Z9109 Other allergy status, other than to drugs and biological substances: Secondary | ICD-10-CM

## 2022-11-23 DIAGNOSIS — E559 Vitamin D deficiency, unspecified: Secondary | ICD-10-CM

## 2022-11-23 DIAGNOSIS — Z136 Encounter for screening for cardiovascular disorders: Secondary | ICD-10-CM

## 2022-11-23 DIAGNOSIS — G720 Drug-induced myopathy: Secondary | ICD-10-CM

## 2022-11-23 DIAGNOSIS — Z1389 Encounter for screening for other disorder: Secondary | ICD-10-CM

## 2022-11-23 DIAGNOSIS — K219 Gastro-esophageal reflux disease without esophagitis: Secondary | ICD-10-CM

## 2022-11-23 DIAGNOSIS — Z0001 Encounter for general adult medical examination with abnormal findings: Secondary | ICD-10-CM

## 2022-11-23 DIAGNOSIS — Z0000 Encounter for general adult medical examination without abnormal findings: Secondary | ICD-10-CM

## 2022-11-23 DIAGNOSIS — L709 Acne, unspecified: Secondary | ICD-10-CM

## 2022-11-23 DIAGNOSIS — Z79899 Other long term (current) drug therapy: Secondary | ICD-10-CM

## 2022-11-23 DIAGNOSIS — E663 Overweight: Secondary | ICD-10-CM

## 2022-11-23 DIAGNOSIS — Z87898 Personal history of other specified conditions: Secondary | ICD-10-CM

## 2022-11-23 DIAGNOSIS — K449 Diaphragmatic hernia without obstruction or gangrene: Secondary | ICD-10-CM

## 2022-11-23 DIAGNOSIS — G8929 Other chronic pain: Secondary | ICD-10-CM

## 2022-11-23 DIAGNOSIS — Z860101 Personal history of adenomatous and serrated colon polyps: Secondary | ICD-10-CM

## 2022-11-23 DIAGNOSIS — E782 Mixed hyperlipidemia: Secondary | ICD-10-CM

## 2022-11-23 DIAGNOSIS — Z8249 Family history of ischemic heart disease and other diseases of the circulatory system: Secondary | ICD-10-CM

## 2022-11-23 NOTE — Progress Notes (Signed)
Complete Physical  Assessment and Plan:  Alison Alvarez was seen today for annual exam.  Diagnoses and all orders for this visit:  Encounter for Annual Physical Exam with abnormal findings Due annually  Health Maintenance reviewed Healthy lifestyle reviewed and goals set  Gastroesophageal reflux disease, esophagitis presence not specified/Hiatal hernia Continue Omprezole No suspected reflux complications (Barret/stricture). Lifestyle modification:  wt loss, avoid meals 2-3h before bedtime. Consider eliminating food triggers:  chocolate, caffeine, EtOH, acid/spicy food.  Thyroid disease Continue Levothyroxine. Dose to be changed based off of most current result Reminded to take on an empty stomach 30-97mins before food.  Stop any Biotin Supplement 48-72 hours before next TSH level to reduce the risk of falsely low TSH levels. Continue to monitor.    History of prediabetes 10/2020 - 5.4% Education: Reviewed 'ABCs' of diabetes management  Discussed goals to be met and/or maintained include A1C (<7) Blood pressure (<130/80) Cholesterol (LDL <70) Continue Eye Exam yearly  Continue Dental Exam Q6 mo Discussed dietary recommendations Discussed Physical Activity recommendations Check A1C  Acne, unspecified acne type Improved with doxycycline 100 mg daily Hygiene reviewed  Anxiety Well managed by current regimen; continue medications Stress management techniques discussed, increase water, good sleep hygiene discussed, increase exercise, and increase veggies.   Mixed hyperlipidemia/ statin myopathy  Discussed lifestyle modifications. Recommended diet heavy in fruits and veggies, omega 3's. Decrease consumption of animal meats, cheeses, and dairy products. Remain active and exercise as tolerated. Continue to monitor. Check lipids/TSH  Chronic low back pain without sciatica, unspecified back pain laterality Monitor, managing with OTC analgesics 2010 - PT Completed - Has reasonable  accomodation.  Vitamin D deficiency Continue supplement Monitor levels  Environmental allergies Continue OTC antihistamine Avoid triggers  Overweight (BMI 25.0-29.9) Discussed appropriate BMI Diet modification. Physical activity. Encouraged/praised to build confidence.  History of adenomatous colon polyps Next due 2029; 7 year recall per Dr. Russella Dar Reduce red meat, processed; increase fiber   Family history of heart disease Check and monitor EKG  Screening for blood or protein in urine Check and monitor Urinalysis/Microalbumin Routine w reflex microscopic  Medication management All medications discussed and reviewed in full. All questions and concerns regarding medications addressed.    Orders Placed This Encounter  Procedures   TSH   Urinalysis, Routine w reflex microscopic   Microalbumin / creatinine urine ratio   Notify office for further evaluation and treatment, questions or concerns if any reported s/s fail to improve.   The patient was advised to call back or seek an in-person evaluation if any symptoms worsen or if the condition fails to improve as anticipated.   Further disposition pending results of labs. Discussed med's effects and SE's.    I discussed the assessment and treatment plan with the patient. The patient was provided an opportunity to ask questions and all were answered. The patient agreed with the plan and demonstrated an understanding of the instructions.  Discussed med's effects and SE's. Screening labs and tests as requested with regular follow-up as recommended.  I provided 35 minutes of face-to-face time during this encounter including counseling, chart review, and critical decision making was preformed.   Future Appointments  Date Time Provider Department Center  05/25/2023  3:30 PM Adela Glimpse, NP [facility] None  11/23/2023  2:00 PM Adela Glimpse, NP [facility] None    HPI  62 y.o. female  presents for a complete physical and  follow up for has Anxiety; Hyperlipidemia; Thyroid disease; History of prediabetes; Chronic back pain; Acne; Vitamin D  deficiency; Environmental allergies; GERD (gastroesophageal reflux disease); Overweight (BMI 25.0-29.9); Statin myopathy; Fatty liver; Hiatal hernia; and History of adenomatous polyp of colon on their problem list.    Single, no kids. 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   Overall she reports feeling well today.  She has no new concerns at this time.   She had a fall on 11/10/21 after a pickle ball accident.  Went to [facility] ER and was noted to have a left side of facial depressed fracture through the floor of the left orbit, She has a f/u with Opth on 12/09/21.  Follows Dr. Henderson Cloud GYN annually.  Had mammogram 08/2021 with clinical concern for pain. Mammogram was negative for malignancy.  She reports that pain has now resolved.  She gets regular massage therapy for chronic lumbar pain/synovial cyst with benefit, has seen neurosurgery Dr. Channing Mutters and had lumbar surgery with resection. Managing with OTC analgesics.   She has cystic acne and folliculitis secondary to PCOS/hirstutism, gets electrolysis which improves significantly. Also uses doxycycline 100 mg PRN.  She has a diagnosis of anxiety and is currently on prozac 20 mg daily, xanax 0.25 mg PRN, reports symptoms are well controlled on current regimen. She reports she uses benzo rarely, typically when flying.   She has a diagnosis of GERD which is currently managed by omeprazole 20 mg due to GERD with hiatal hernia, has failed attempt to taper off of PPI.   BMI is Body mass index is 28.3 kg/m., she has not been working on diet and exercise. Wt Readings from Last 3 Encounters:  11/23/22 149 lb 12.8 oz (67.9 kg)  09/07/22 150 lb (68 kg)  11/25/21 154 lb 6.4 oz (70 kg)   Her blood pressure has been controlled at home, today  their BP is BP: 132/78 She does workout. She denies chest pain, shortness of breath, dizziness.   She is on cholesterol medication (nexlizet - myalgias/brain fog with even low dose statin) and denies myalgias. Her cholesterol is at goal. The cholesterol last visit was:   Lab Results  Component Value Date   CHOL 188 09/07/2022   HDL 50 09/07/2022   LDLCALC 109 (H) 09/07/2022   TRIG 175 (H) 09/07/2022   CHOLHDL 3.8 09/07/2022   She takes metformin 500 mg for hx of insulin resistance and for weight. Last A1C in the office was:  Lab Results  Component Value Date   HGBA1C 5.8 (H) 09/07/2022   Last GFR: Lab Results  Component Value Date   GFRNONAA 87 05/16/2020   Patient is on Vitamin D supplement, has reduced to once weekly.  Lab Results  Component Value Date   VD25OH 13 11/25/2021     She is on thyroid medication. Her medication was not changed last visit.  She is taking 75 mcg, 1/2 tab M/F and whole tab all other days.  Lab Results  Component Value Date   TSH 1.39 09/07/2022     Current Medications:  Current Outpatient Medications on File Prior to Visit  Medication Sig Dispense Refill   acetaminophen (TYLENOL) 500 MG tablet Take 1 tablet (500 mg total) by mouth every 6 (six) hours as needed for mild pain, fever or headache. 30 tablet    ALPRAZolam (XANAX) 0.25 MG tablet Take 1 tablet (0.25 mg total) by mouth 2 (two) times daily as needed for anxiety. 60 tablet 0   Bempedoic Acid-Ezetimibe (NEXLIZET) 180-10 MG TABS Take  1 tablet by mouth daily. 90 tablet 3   Bioflavonoid Products (PERIDRIN-C) 200-50-150 MG TABS Take 2 tablets by mouth daily.      cetirizine (ZYRTEC) 10 MG tablet Take 10 mg by mouth daily.     doxycycline (VIBRAMYCIN) 100 MG capsule Take 1 capsule (100 mg total) by mouth daily for acne with food 90 capsule 0   FLUoxetine (PROZAC) 20 MG capsule TAKE 1 CAPSULE BY MOUTH 2 TIMES DAILY FOR MOOD (Patient taking differently: 20 mg daily.) 180 capsule 1   levothyroxine  (SYNTHROID) 75 MCG tablet Take 1/2 tab daily except whole tab Mon/Thurs. Take on an empty stomach with water only. (Patient taking differently: Take 1/2 tablet on Tuesdays and Wednesdays and 1 tablet all other days) 90 tablet 3   linaclotide (LINZESS) 145 MCG CAPS capsule Take 1 capsule (145 mcg total) by mouth daily for constipation. 90 capsule 3   metFORMIN (GLUCOPHAGE-XR) 500 MG 24 hr tablet Take 1 tablet by mouth daily for Insulin Resistance & Weight 90 tablet 3   mometasone (NASONEX) 50 MCG/ACT nasal spray Place 2 sprays into the nose daily.     omeprazole (PRILOSEC) 40 MG capsule Take 1 capsule (40 mg total) by mouth daily. Best to take on an empty stomach 20 - 30 mins before food. 90 capsule 3   Vitamin D, Ergocalciferol, (DRISDOL) 1.25 MG (50000 UNIT) CAPS capsule Take 1 capsule by mouth once a week 12 capsule 3   No current facility-administered medications on file prior to visit.   Allergies:  Allergies  Allergen Reactions   Advicor [Niacin-Lovastatin Er] Swelling    Facial swelling   Niacin-Lovastatin Er Swelling and Other (See Comments)    Extreme flushing   Rosuvastatin Other (See Comments)    Myalgias and brain fog even at low dose   Medical History:  She has Anxiety; Hyperlipidemia; Thyroid disease; History of prediabetes; Chronic back pain; Acne; Vitamin D deficiency; Environmental allergies; GERD (gastroesophageal reflux disease); Overweight (BMI 25.0-29.9); Statin myopathy; Fatty liver; Hiatal hernia; and History of adenomatous polyp of colon on their problem list.   Health Maintenance:   Immunization History  Administered Date(s) Administered   DTaP 01/27/2003   Influenza Inj Mdck Quad With Preservative 11/02/2019   Influenza-Unspecified 10/26/2012, 10/27/2018, 11/11/2020, 11/17/2021, 11/13/2022   PFIZER(Purple Top)SARS-COV-2 Vaccination 01/24/2019, 02/14/2019, 11/25/2019   Tdap 07/26/2013   Tetanus: 2015 Pneumovax: Prevnar 13:  Flu vaccine: 11/13/2022 at  work Shingrix: declines for now Covid 19: 2/2, 2021, pfizer  LMP: No LMP recorded. Patient is postmenopausal. Pap: 01/2021 Negative Dr. Huntley Dec MGM: 08/2022  Colonoscopy: 09/2020, Dr. Russella Dar, adenomatous polyps, 7 year recall  EGD: 09/2020, esophagitis  Last vision exam: wears glasses, Due 10/2022 - nevus on left eye Last dental exam: goes q81m, 2024 12/3  Patient Care Team: Lucky Cowboy, MD as PCP - General (Internal Medicine) Meryl Dare, MD as Consulting Physician (Gastroenterology) Harold Hedge, MD as Consulting Physician (Obstetrics and Gynecology) Emeline General, MD as Referring Physician (Neurosurgery)  Surgical History:  She has a past surgical history that includes Synovial cyst excision (2010); Brain meningioma excision (1965); Tonsillectomy; wisom teeth (1984); Diagnostic laparoscopy; Breast excisional biopsy (Left); and Breast lumpectomy (Left). Family History:  Herfamily history includes Breast cancer in her paternal aunt; Cancer in her maternal grandfather and paternal aunt; Dementia in her mother; Diabetes in her father; Endometrial cancer in her sister; Heart disease in her father; Hyperlipidemia in her brother and father; Hypertension in her brother and father; Osteoporosis in her mother; Parkinson's  disease in her father; Stroke in her father. Social History:  She reports that she has never smoked. She has never used smokeless tobacco. She reports current alcohol use. She reports that she does not use drugs.  Review of Systems: Review of Systems  Constitutional:  Negative for malaise/fatigue and weight loss.  HENT:  Negative for hearing loss and tinnitus.   Eyes:  Negative for blurred vision and double vision.  Respiratory:  Negative for cough, sputum production, shortness of breath and wheezing.   Cardiovascular:  Negative for chest pain, palpitations, orthopnea, claudication, leg swelling and PND.  Gastrointestinal:  Negative for abdominal pain, blood in stool,  constipation, diarrhea, heartburn, melena, nausea and vomiting.  Genitourinary: Negative.   Musculoskeletal:  Positive for back pain (lumbar, intermittent, worse after work). Negative for falls, joint pain and myalgias.  Skin:  Negative for rash.  Neurological:  Negative for dizziness, tingling, sensory change, weakness and headaches.  Endo/Heme/Allergies:  Negative for polydipsia.  Psychiatric/Behavioral: Negative.  Negative for depression, memory loss, substance abuse and suicidal ideas. The patient is not nervous/anxious and does not have insomnia.   All other systems reviewed and are negative.   Physical Exam: Estimated body mass index is 28.3 kg/m as calculated from the following:   Height as of this encounter: 5\' 1"  (1.549 m).   Weight as of this encounter: 149 lb 12.8 oz (67.9 kg). BP 132/78   Pulse 67   Temp (!) 97.5 F (36.4 C)   Ht 5\' 1"  (1.549 m)   Wt 149 lb 12.8 oz (67.9 kg)   SpO2 99%   BMI 28.30 kg/m  General Appearance: Well nourished, in no apparent distress.  Eyes: PERRLA, EOMs, conjunctiva no swelling or erythema Sinuses: No Frontal/maxillary tenderness  ENT/Mouth: Ext aud canals clear, normal light reflex with TMs without erythema, bulging. Good dentition. No erythema, swelling, or exudate on post pharynx. Tonsils not swollen or erythematous. Hearing normal.  Neck: Supple, thyroid normal. No bruits  Respiratory: Respiratory effort normal, BS equal bilaterally without rales, rhonchi, wheezing or stridor.  Cardio: RRR without murmurs, rubs or gallops. Brisk peripheral pulses without edema.  Chest: symmetric, with normal excursions and percussion.  Breasts: Defer to GYN Abdomen: Soft, nontender, no guarding, rebound, hernias, masses, or organomegaly.  Lymphatics: Non tender without lymphadenopathy.  Genitourinary: Defer to GYN Musculoskeletal: Full ROM all peripheral extremities,5/5 strength, and normal gait.  Skin: Warm, dry without rashes, lesions,  ecchymosis. Neuro: Cranial nerves intact, reflexes equal bilaterally. Normal muscle tone, no cerebellar symptoms. Sensation intact.  Psych: Awake and oriented X 3, normal affect, Insight and Judgment appropriate.   EKG:  SB no ST changes.  Alison Alvarez 4:32 PM [facility]

## 2022-11-23 NOTE — Patient Instructions (Signed)

## 2022-11-24 ENCOUNTER — Encounter: Payer: Self-pay | Admitting: Internal Medicine

## 2022-11-24 LAB — URINALYSIS, ROUTINE W REFLEX MICROSCOPIC
Bilirubin Urine: NEGATIVE
Glucose, UA: NEGATIVE
Hgb urine dipstick: NEGATIVE
Ketones, ur: NEGATIVE
Leukocytes,Ua: NEGATIVE
Nitrite: NEGATIVE
Protein, ur: NEGATIVE
Specific Gravity, Urine: 1.006 (ref 1.001–1.035)
pH: 6.5 (ref 5.0–8.0)

## 2022-11-24 LAB — MICROALBUMIN / CREATININE URINE RATIO
Creatinine, Urine: 26 mg/dL (ref 20–275)
Microalb Creat Ratio: 8 mg/g{creat} (ref <30)
Microalb, Ur: 0.2 mg/dL

## 2022-11-24 LAB — TSH: TSH: 1.28 m[IU]/L (ref 0.40–4.50)

## 2022-11-26 ENCOUNTER — Encounter: Payer: Commercial Managed Care - PPO | Admitting: Nurse Practitioner

## 2022-11-27 NOTE — Addendum Note (Signed)
Addended by: Adela Glimpse on: 11/27/2022 12:24 PM   Modules accepted: Orders

## 2022-11-30 ENCOUNTER — Encounter: Payer: Self-pay | Admitting: Internal Medicine

## 2022-12-09 ENCOUNTER — Encounter: Payer: Self-pay | Admitting: Internal Medicine

## 2022-12-28 ENCOUNTER — Other Ambulatory Visit (HOSPITAL_COMMUNITY): Payer: Self-pay

## 2022-12-28 ENCOUNTER — Other Ambulatory Visit: Payer: Self-pay | Admitting: Nurse Practitioner

## 2022-12-28 DIAGNOSIS — E039 Hypothyroidism, unspecified: Secondary | ICD-10-CM

## 2022-12-28 MED ORDER — LEVOTHYROXINE SODIUM 75 MCG PO TABS
ORAL_TABLET | ORAL | 1 refills | Status: DC
  Filled 2022-12-28: qty 50, 77d supply, fill #0
  Filled 2023-02-28: qty 50, 77d supply, fill #1

## 2022-12-29 ENCOUNTER — Other Ambulatory Visit (HOSPITAL_COMMUNITY): Payer: Self-pay

## 2023-01-01 ENCOUNTER — Other Ambulatory Visit (HOSPITAL_COMMUNITY): Payer: Self-pay

## 2023-01-04 ENCOUNTER — Other Ambulatory Visit (HOSPITAL_COMMUNITY): Payer: Self-pay

## 2023-01-07 ENCOUNTER — Other Ambulatory Visit (HOSPITAL_COMMUNITY): Payer: Self-pay

## 2023-01-21 ENCOUNTER — Other Ambulatory Visit: Payer: Self-pay

## 2023-01-21 ENCOUNTER — Other Ambulatory Visit: Payer: Self-pay | Admitting: Nurse Practitioner

## 2023-01-21 ENCOUNTER — Other Ambulatory Visit (HOSPITAL_COMMUNITY): Payer: Self-pay

## 2023-01-21 MED ORDER — VITAMIN D (ERGOCALCIFEROL) 1.25 MG (50000 UNIT) PO CAPS
50000.0000 [IU] | ORAL_CAPSULE | ORAL | 3 refills | Status: AC
  Filled 2023-01-21: qty 12, 84d supply, fill #0
  Filled 2023-06-01: qty 12, 84d supply, fill #1
  Filled 2023-08-22: qty 12, 84d supply, fill #2
  Filled 2023-11-14: qty 12, 84d supply, fill #3

## 2023-01-23 ENCOUNTER — Other Ambulatory Visit (HOSPITAL_COMMUNITY): Payer: Self-pay

## 2023-01-25 ENCOUNTER — Other Ambulatory Visit (HOSPITAL_COMMUNITY): Payer: Self-pay

## 2023-02-26 ENCOUNTER — Other Ambulatory Visit: Payer: Self-pay | Admitting: Nurse Practitioner

## 2023-02-26 ENCOUNTER — Other Ambulatory Visit (HOSPITAL_COMMUNITY): Payer: Self-pay

## 2023-02-26 DIAGNOSIS — L709 Acne, unspecified: Secondary | ICD-10-CM

## 2023-02-26 MED ORDER — FLUOXETINE HCL 20 MG PO CAPS
ORAL_CAPSULE | ORAL | 1 refills | Status: DC
  Filled 2023-02-26: qty 180, 90d supply, fill #0
  Filled 2023-06-01: qty 180, 90d supply, fill #1

## 2023-02-26 MED ORDER — DOXYCYCLINE HYCLATE 100 MG PO CAPS
100.0000 mg | ORAL_CAPSULE | Freq: Every day | ORAL | 0 refills | Status: DC
  Filled 2023-02-26: qty 90, 90d supply, fill #0

## 2023-03-03 ENCOUNTER — Encounter: Payer: Self-pay | Admitting: Nurse Practitioner

## 2023-03-03 ENCOUNTER — Other Ambulatory Visit (HOSPITAL_COMMUNITY): Payer: Self-pay

## 2023-03-03 DIAGNOSIS — E039 Hypothyroidism, unspecified: Secondary | ICD-10-CM

## 2023-03-03 MED ORDER — LEVOTHYROXINE SODIUM 75 MCG PO TABS
75.0000 ug | ORAL_TABLET | Freq: Every day | ORAL | 1 refills | Status: DC
  Filled 2023-03-03: qty 90, fill #0
  Filled 2023-03-09: qty 77, 90d supply, fill #0
  Filled 2023-04-09: qty 72, 84d supply, fill #0

## 2023-03-04 ENCOUNTER — Other Ambulatory Visit (HOSPITAL_COMMUNITY): Payer: Self-pay

## 2023-03-09 ENCOUNTER — Other Ambulatory Visit (HOSPITAL_COMMUNITY): Payer: Self-pay

## 2023-03-20 ENCOUNTER — Other Ambulatory Visit (HOSPITAL_COMMUNITY): Payer: Self-pay

## 2023-03-30 ENCOUNTER — Other Ambulatory Visit: Payer: Self-pay

## 2023-04-05 ENCOUNTER — Other Ambulatory Visit (HOSPITAL_BASED_OUTPATIENT_CLINIC_OR_DEPARTMENT_OTHER): Payer: Self-pay

## 2023-04-05 ENCOUNTER — Other Ambulatory Visit (HOSPITAL_COMMUNITY): Payer: Self-pay

## 2023-04-05 ENCOUNTER — Other Ambulatory Visit: Payer: Self-pay | Admitting: Nurse Practitioner

## 2023-04-08 ENCOUNTER — Other Ambulatory Visit (HOSPITAL_COMMUNITY): Payer: Self-pay

## 2023-04-09 ENCOUNTER — Other Ambulatory Visit: Payer: Self-pay

## 2023-04-09 ENCOUNTER — Other Ambulatory Visit (HOSPITAL_COMMUNITY): Payer: Self-pay

## 2023-04-09 MED ORDER — METFORMIN HCL ER 500 MG PO TB24
500.0000 mg | ORAL_TABLET | Freq: Every day | ORAL | 0 refills | Status: DC
  Filled 2023-04-09 – 2023-04-10 (×3): qty 90, 90d supply, fill #0

## 2023-04-10 ENCOUNTER — Other Ambulatory Visit (HOSPITAL_COMMUNITY): Payer: Self-pay

## 2023-05-25 ENCOUNTER — Other Ambulatory Visit: Payer: Self-pay

## 2023-05-25 ENCOUNTER — Encounter: Payer: Self-pay | Admitting: Internal Medicine

## 2023-05-25 ENCOUNTER — Ambulatory Visit
Admission: EM | Admit: 2023-05-25 | Discharge: 2023-05-25 | Disposition: A | Discharge status: Home / Self Care | Attending: Family Medicine | Admitting: Family Medicine

## 2023-05-25 ENCOUNTER — Ambulatory Visit: Payer: Commercial Managed Care - PPO | Admitting: Internal Medicine

## 2023-05-25 ENCOUNTER — Ambulatory Visit: Payer: Commercial Managed Care - PPO | Admitting: Nurse Practitioner

## 2023-05-25 ENCOUNTER — Other Ambulatory Visit (HOSPITAL_COMMUNITY): Payer: Self-pay

## 2023-05-25 VITALS — BP 110/70 | HR 75 | Temp 98.2°F | Wt 148.6 lb

## 2023-05-25 DIAGNOSIS — E559 Vitamin D deficiency, unspecified: Secondary | ICD-10-CM

## 2023-05-25 DIAGNOSIS — F419 Anxiety disorder, unspecified: Secondary | ICD-10-CM | POA: Diagnosis not present

## 2023-05-25 DIAGNOSIS — G8929 Other chronic pain: Secondary | ICD-10-CM

## 2023-05-25 DIAGNOSIS — K219 Gastro-esophageal reflux disease without esophagitis: Secondary | ICD-10-CM | POA: Diagnosis not present

## 2023-05-25 DIAGNOSIS — G720 Drug-induced myopathy: Secondary | ICD-10-CM | POA: Diagnosis not present

## 2023-05-25 DIAGNOSIS — M545 Low back pain, unspecified: Secondary | ICD-10-CM | POA: Diagnosis not present

## 2023-05-25 DIAGNOSIS — E782 Mixed hyperlipidemia: Secondary | ICD-10-CM | POA: Diagnosis not present

## 2023-05-25 DIAGNOSIS — E079 Disorder of thyroid, unspecified: Secondary | ICD-10-CM | POA: Diagnosis not present

## 2023-05-25 DIAGNOSIS — T466X5A Adverse effect of antihyperlipidemic and antiarteriosclerotic drugs, initial encounter: Secondary | ICD-10-CM | POA: Diagnosis not present

## 2023-05-25 DIAGNOSIS — Z87898 Personal history of other specified conditions: Secondary | ICD-10-CM | POA: Diagnosis not present

## 2023-05-25 DIAGNOSIS — L03221 Cellulitis of neck: Secondary | ICD-10-CM

## 2023-05-25 MED ORDER — HYDROCODONE-ACETAMINOPHEN 5-300 MG PO TABS
1.0000 | ORAL_TABLET | Freq: Every day | ORAL | 0 refills | Status: DC | PRN
  Filled 2023-05-25 (×2): qty 10, 10d supply, fill #0

## 2023-05-25 MED ORDER — MUPIROCIN 2 % EX OINT: 1.0000 | TOPICAL_OINTMENT | Freq: Two times a day (BID) | CUTANEOUS | 0 refills | Status: AC

## 2023-05-25 MED ORDER — HYDROCODONE-ACETAMINOPHEN 5-325 MG PO TABS
1.0000 | ORAL_TABLET | Freq: Every day | ORAL | 0 refills | Status: AC | PRN
  Filled 2023-05-25: qty 10, 10d supply, fill #0

## 2023-05-25 MED ORDER — CEPHALEXIN 500 MG PO CAPS: 500.0000 mg | ORAL_CAPSULE | Freq: Three times a day (TID) | ORAL | 0 refills | Status: AC

## 2023-05-25 NOTE — ED Triage Notes (Signed)
 Pt c/o abscess is erythematousx2d. Pt states she broke open the fluid to get it out, because it was hurting her. Pt states the drainage was serosanguinous. The area under left side of neck is erythematous.

## 2023-05-25 NOTE — Discharge Instructions (Addendum)
 Keep the area clean and dry.  Apply mupirocin topical antibiotic twice daily for a week.  Start Keflex  3 times a day for a week.  Monitor for any worsening signs of infection which include but are not limited to fever, worsening redness drainage, chills, or any other concerns that arise.  If these occur please go to the emergency room.  Follow-up with your PCP in 2 to 3 days for recheck.  Hope you feel better soon!

## 2023-05-25 NOTE — Progress Notes (Signed)
 New Patient Office Visit     CC/Reason for Visit: Establish care, discuss chronic medical conditions Previous PCP: Vangie Genet, MD Last Visit: October 2024  HPI: Alison Alvarez is a 63 y.o. female who is coming in today for the above mentioned reasons. Past Medical History is significant for: GERD, hypothyroidism, impaired glucose tolerance, acne, generalized anxiety disorder, hyperlipidemia with a statin myopathy, vitamin D  deficiency and seasonal allergies.  She is requesting TSH to be done today.  She is otherwise feeling well.  She has chronic back pain after a back surgery in 2010 and is requesting 10 tablets of hydrocodone .  She states this will last her a very long time.   Past Medical/Surgical History: Past Medical History:  Diagnosis Date   Acne    Anxiety    Arthritis    Cervical polyp    Chronic back pain    Depression    Endometriosis    GERD (gastroesophageal reflux disease)    History of colon polyps 2012   Hyperlipidemia    Hypothyroidism, adult    hypothyroidism   PCO (polycystic ovaries)    Plantar fasciitis    Pleural effusion, bilateral    Post-menopausal    Pre-diabetes     Past Surgical History:  Procedure Laterality Date   BRAIN MENINGIOMA EXCISION  1965   at age 89   BREAST EXCISIONAL BIOPSY Left    BREAST LUMPECTOMY Left    lumpectomy 1990's   DIAGNOSTIC LAPAROSCOPY     SYNOVIAL CYST EXCISION  2010   l5, s1 left hip   TONSILLECTOMY     wisom teeth  1984    Social History:  reports that she has never smoked. She has never used smokeless tobacco. She reports current alcohol use. She reports that she does not use drugs.  Allergies: Allergies  Allergen Reactions   Advicor [Niacin-Lovastatin Er] Swelling    Facial swelling   Niacin-Lovastatin Er Swelling and Other (See Comments)    Extreme flushing   Rosuvastatin  Other (See Comments)    Myalgias and brain fog even at low dose    Family History:  Family History  Problem  Relation Age of Onset   Dementia Mother    Osteoporosis Mother    Varicose Veins Mother    Heart disease Father    Parkinson's disease Father    Stroke Father    Diabetes Father    Hyperlipidemia Father    Hypertension Father    Endometrial cancer Sister    Hypertension Brother    Hyperlipidemia Brother    Cancer Maternal Grandfather        brain   Cancer Paternal Aunt        breast   Breast cancer Paternal Aunt    Diabetes Paternal Aunt    Colon cancer Neg Hx    Esophageal cancer Neg Hx      Current Outpatient Medications:    acetaminophen  (TYLENOL ) 500 MG tablet, Take 1 tablet (500 mg total) by mouth every 6 (six) hours as needed for mild pain, fever or headache., Disp: 30 tablet, Rfl:    ALPRAZolam  (XANAX ) 0.25 MG tablet, Take 1 tablet (0.25 mg total) by mouth 2 (two) times daily as needed for anxiety., Disp: 60 tablet, Rfl: 0   Bempedoic Acid -Ezetimibe  (NEXLIZET ) 180-10 MG TABS, Take 1 tablet by mouth daily., Disp: 90 tablet, Rfl: 3   Bioflavonoid Products (PERIDRIN-C) 200-50-150 MG TABS, Take 2 tablets by mouth daily. , Disp: , Rfl:  cetirizine (ZYRTEC) 10 MG tablet, Take 10 mg by mouth daily., Disp: , Rfl:    doxycycline  (VIBRAMYCIN ) 100 MG capsule, Take 1 capsule (100 mg total) by mouth daily for acne with food, Disp: 90 capsule, Rfl: 0   FLUoxetine  (PROZAC ) 20 MG capsule, TAKE 1 CAPSULE BY MOUTH 2 TIMES DAILY FOR MOOD, Disp: 180 capsule, Rfl: 1   HYDROcodone -Acetaminophen  5-300 MG TABS, Take 1 tablet by mouth daily as needed., Disp: 10 tablet, Rfl: 0   levothyroxine  (SYNTHROID ) 75 MCG tablet, Take 1 tablet (75 mcg total) by mouth daily, except take 1/2 tablet on Wednesday and Friday. (Patient taking differently: Take 75 mcg by mouth daily before breakfast.), Disp: 90 tablet, Rfl: 1   linaclotide  (LINZESS ) 145 MCG CAPS capsule, Take 1 capsule (145 mcg total) by mouth daily for constipation., Disp: 90 capsule, Rfl: 3   metFORMIN  (GLUCOPHAGE -XR) 500 MG 24 hr tablet, Take 1  tablet by mouth daily for Insulin  Resistance & Weight., Disp: 90 tablet, Rfl: 0   mometasone  (NASONEX ) 50 MCG/ACT nasal spray, Place 2 sprays into the nose daily., Disp: , Rfl:    omeprazole  (PRILOSEC) 40 MG capsule, Take 1 capsule (40 mg total) by mouth daily. Best to take on an empty stomach 20 - 30 mins before food., Disp: 90 capsule, Rfl: 3   Vitamin D , Ergocalciferol , (DRISDOL ) 1.25 MG (50000 UNIT) CAPS capsule, Take 1 capsule (50,000 Units total) by mouth every 7 (seven) days., Disp: 12 capsule, Rfl: 3  Review of Systems:  Negative except as indicated in HPI.   Physical Exam: Vitals:   05/25/23 1448  BP: 110/70  Pulse: 75  Temp: 98.2 F (36.8 C)  TempSrc: Oral  SpO2: 96%  Weight: 148 lb 9.6 oz (67.4 kg)   Body mass index is 28.08 kg/m.  Physical Exam Vitals reviewed.  Constitutional:      Appearance: Normal appearance.  HENT:     Head: Normocephalic and atraumatic.  Eyes:     Conjunctiva/sclera: Conjunctivae normal.     Pupils: Pupils are equal, round, and reactive to light.  Cardiovascular:     Rate and Rhythm: Normal rate and regular rhythm.  Pulmonary:     Effort: Pulmonary effort is normal.     Breath sounds: Normal breath sounds.  Skin:    General: Skin is warm and dry.  Neurological:     General: No focal deficit present.     Mental Status: She is alert and oriented to person, place, and time.  Psychiatric:        Mood and Affect: Mood normal.        Behavior: Behavior normal.        Thought Content: Thought content normal.        Judgment: Judgment normal.       Impression and Plan:  Thyroid  disease -     TSH; Future  Mixed hyperlipidemia  History of prediabetes  Vitamin D  deficiency  Gastroesophageal reflux disease, unspecified whether esophagitis present  Statin myopathy  Chronic low back pain without sciatica, unspecified back pain laterality -     HYDROcodone -Acetaminophen ; Take 1 tablet by mouth daily as needed.  Dispense: 10  tablet; Refill: 0  Anxiety   - Check TSH. - 10 tablets of hydrocodone  given. - Return in October for annual physical.  Time spent: 46 minutes reviewing chart, interviewing and examining patient's and formulating plan of care.      Marguerita Shih, MD [facility]

## 2023-05-25 NOTE — ED Provider Notes (Addendum)
 [facility]    CSN: [id number] Arrival date & time: 05/25/23  1715      History   Chief Complaint No chief complaint on file.   HPI Alison Alvarez is a 63 y.o. female presents for a skin infection.  Patient reports she has frequent ingrown hairs around her face and neck.  She reports about 2 weeks ago she had an ingrown hair on the left side of her neck just below the jaw that she has been treating with over-the-counter colloid bandages.  States she is getting some drainage with that.  Noticed some redness extending from the area.  No swelling, fevers or chills.  No history of MRSA.  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/2018   GERD (gastroesophageal reflux disease) 06/09/2018   Overweight (BMI 25.0-29.9) 06/09/2018   Environmental allergies 08/07/2014   Vitamin D  deficiency 01/15/2013   Anxiety    Hyperlipidemia    Thyroid  disease    History of prediabetes    Chronic back pain    Acne     Past Surgical History:  Procedure Laterality Date   BRAIN MENINGIOMA EXCISION  1965   at age 49   BREAST EXCISIONAL BIOPSY Left    BREAST LUMPECTOMY Left    lumpectomy 1990's   DIAGNOSTIC LAPAROSCOPY     SYNOVIAL CYST EXCISION  2010   l5, s1 left hip   TONSILLECTOMY     wisom teeth  1984    OB History   No obstetric history on file.      Home Medications    Prior to Admission  medications   Medication Sig Start Date End Date Taking? Authorizing Provider  cephALEXin  (KEFLEX ) 500 MG capsule Take 1 capsule (500 mg total) by mouth 3 (three) times daily for 7 days. 05/25/23 06/01/23 Yes Kayleen Alig, Jodi R, NP  mupirocin ointment (BACTROBAN) 2 % Apply 1 Application topically 2 (two) times daily for 7 days. 05/25/23 06/01/23 Yes Delora Gravatt, Jodi R, NP  acetaminophen  (TYLENOL ) 500 MG tablet Take 1 tablet (500 mg total) by mouth every 6 (six) hours as needed for mild pain, fever or headache. 12/17/17   Kraig Peru, MD  ALPRAZolam  (XANAX ) 0.25 MG tablet Take 1 tablet (0.25 mg total) by mouth 2 (two) times daily as needed for anxiety. 05/16/20   Lares Bureau, NP  Bempedoic Acid -Ezetimibe  (NEXLIZET ) 180-10 MG TABS Take 1 tablet by mouth daily. 10/05/22 10/05/23  Cranford, Tonya, NP  Bioflavonoid Products (PERIDRIN-C) 200-50-150 MG TABS Take 2 tablets by mouth daily.     [provider]  cetirizine (ZYRTEC) 10 MG tablet Take 10 mg by mouth daily.    [provider]  doxycycline  (VIBRAMYCIN ) 100 MG capsule Take 1 capsule (100 mg total) by mouth daily for acne  with food 02/26/23   Cranford, Tonya, NP  FLUoxetine  (PROZAC ) 20 MG capsule TAKE 1 CAPSULE BY MOUTH 2 TIMES DAILY FOR MOOD 02/26/23 02/26/24  Cranford, Tonya, NP  HYDROcodone -acetaminophen  (NORCO/VICODIN) 5-325 MG tablet Take 1 tablet by mouth daily as needed for moderate pain (pain score 4-6). 05/25/23   Zilphia Hilt, Charyl Coppersmith, MD  levothyroxine  (SYNTHROID ) 75 MCG tablet Take 1 tablet (75 mcg total) by mouth daily, except take 1/2 tablet on Wednesday and Friday. Patient taking differently: Take 75 mcg by mouth daily before breakfast. 03/03/23   Langley Pippin, NP  linaclotide  (LINZESS ) 145 MCG CAPS capsule Take 1 capsule (145 mcg total) by mouth daily for constipation. 08/21/22   Wilkinson, Dana E, FNP  metFORMIN  (GLUCOPHAGE -XR) 500 MG 24 hr tablet Take 1 tablet by mouth daily for Insulin  Resistance & Weight. 04/09/23   Webb,  Padonda B, FNP  mometasone  (NASONEX ) 50 MCG/ACT nasal spray Place 2 sprays into the nose daily.    [provider]  omeprazole  (PRILOSEC) 40 MG capsule Take 1 capsule (40 mg total) by mouth daily. Best to take on an empty stomach 20 - 30 mins before food. 10/30/22   Cranford, Tonya, NP  Vitamin D , Ergocalciferol , (DRISDOL ) 1.25 MG (50000 UNIT) CAPS capsule Take 1 capsule (50,000 Units total) by mouth every 7 (seven) days. 01/21/23   Stephane Ee, FNP    Family History Family History  Problem Relation Age of Onset   Dementia Mother    Osteoporosis Mother    Varicose Veins Mother    Heart disease Father    Parkinson's disease Father    Stroke Father    Diabetes Father    Hyperlipidemia Father    Hypertension Father    Endometrial cancer Sister    Hypertension Brother    Hyperlipidemia Brother    Cancer Maternal Grandfather        brain   Cancer Paternal Aunt        breast   Breast cancer Paternal Aunt    Diabetes Paternal Aunt    Colon cancer Neg Hx    Esophageal cancer Neg Hx     Social History Social History   Tobacco Use   Smoking status: Never   Smokeless tobacco: Never  Vaping Use   Vaping status: Never Used  Substance Use Topics   Alcohol use: Yes    Comment: Occasionally   Drug use: No     Allergies   Advicor [niacin-lovastatin er], Niacin-lovastatin er, and Rosuvastatin    Review of Systems Review of Systems  Skin:  Positive for wound.     Physical Exam Triage Vital Signs ED Triage Vitals  Encounter Vitals Group     BP 05/25/23 1732 116/73     Systolic BP Percentile --      Diastolic BP Percentile --      Pulse Rate 05/25/23 1732 72     Resp 05/25/23 1732 17     Temp 05/25/23 1732 99.1 F (37.3 C)     Temp Source 05/25/23 1732 Oral     SpO2 05/25/23 1732 94 %     Weight --      Height --      Head Circumference --      Peak Flow --      Pain Score 05/25/23 1729 3     Pain Loc --      Pain Education --      Exclude from  Growth Chart --    No data found.  Updated Vital Signs BP 116/73   Pulse 72   Temp 99.1 F (37.3 C) (Oral)   Resp 17   SpO2 94%   Visual Acuity Right Eye Distance:   Left Eye Distance:   Bilateral Distance:    Right Eye Near:   Left Eye Near:    Bilateral Near:     Physical Exam Vitals and nursing note reviewed.  Constitutional:      General: She is not in acute distress.    Appearance: Normal appearance. She is not ill-appearing, toxic-appearing or diaphoretic.  HENT:     Head: Normocephalic and atraumatic.  Eyes:     Pupils: Pupils are equal, round, and reactive to light.  Neck:      Comments: There is a small scab lesion to the left neck just adjacent to the jawline.  There is some mild erythema without swelling extending.  No streaking.  No warmth or active drainage.  No induration or fluctuance. Cardiovascular:     Rate and Rhythm: Normal rate.  Pulmonary:     Effort: Pulmonary effort is normal.  Skin:    General: Skin is warm and dry.  Neurological:     General: No focal deficit present.     Mental Status: She is alert and oriented to person, place, and time.  Psychiatric:        Mood and Affect: Mood normal.        Behavior: Behavior normal.      UC Treatments / Results  Labs (all labs ordered are listed, but only abnormal results are displayed) Labs Reviewed - No data to display  COMPLETE METABOLIC PANEL WITH GFR Order: [id number]  Status: Final result     Next appt: 11/30/2023 at 10:30 AM in [facility] Zilphia Hilt, MD)     Dx: Fatty liver; Medication management   Test Result Released: Yes (seen)     Messages: Seen   0 Result Notes     1 Patient Communication          Component Ref Range & Units (hover) 8 mo ago (09/07/22) 1 yr ago (11/25/21) 2 yr ago (05/13/21) 2 yr ago (11/19/20) 3 yr ago (05/16/20) 3 yr ago (10/18/19) 4 yr ago (01/24/19)  Glucose, Bld 80 79 CM 86 CM 88 CM 84 CM 93 CM 111 High  CM  Comment: .             Fasting reference interval .  BUN 11 10 13 20 11 17 10   Creat 0.74 0.88 0.79 0.73 0.75 R, CM 0.78 CM 0.82 CM  eGFR 91 75 85 CM 94 CM     BUN/Creatinine Ratio SEE NOTE: SEE NOTE: CM NOT APPLICABLE NOT APPLICABLE NOT APPLICABLE NOT APPLICABLE NOT APPLICABLE  Comment:    Not Reported: BUN and Creatinine are within    reference range. .  Sodium 137 136 138 139 136 135 139  Potassium 4.6 4.2 4.8 4.7 3.9 4.0 4.1  Chloride 100 98 99 101 97 Low  99 101  CO2 28 30 28 30 30 25 28   Calcium  10.2 9.7 10.2 9.9 9.8 9.7 10.3  Total Protein 7.1 7.0 6.8 6.9 6.6 6.5 6.9  Albumin 4.8 4.8 4.5 4.6 4.6 4.6 4.7  Globulin 2.3 2.2 2.3 2.3 2.0 1.9 2.2  AG Ratio 2.1 2.2 2.0 2.0 2.3 2.4 2.1  Total Bilirubin 0.4 0.4 0.4 0.5 0.5 0.4 0.4  Alkaline phosphatase (APISO) 51 62 54 52 53 48 48  AST 28 29  26 23 34 26 30  ALT 21 21 24 24  33 High  23 29  Resulting Agency [facility] [place] [facility] [place] [facility] [place] [facility]         Resulting Agency's Comment  Performing Organization Information:     Site ID[facility] (CLIA: [id number])     Name: [facility]     Address: [address] [place], [place] [address]     Director: Pleas Brill MD  Specimen Collected: 09/07/22 15:32 Last Resulted: 09/08/22 09:06    EKG   Radiology No results found.  Procedures Procedures (including critical care time)  Medications Ordered in UC Medications - No data to display  Initial Impression / Assessment and Plan / UC Course  I have reviewed the triage vital signs and the nursing notes.  Pertinent labs & imaging results that were available during my care of the patient were reviewed by me and considered in my medical decision making (see chart for details).     Reviewed exam and symptoms with patient.  No red flags.  Discussed localized cellulitis.  Will do Keflex  and topical mupirocin.  Discussed keeping wound clean  and dry.  Advised PCP follow-up 2 to 3 days for recheck.  Strict ER precautions reviewed and patient verbalized understanding Final Clinical Impressions(s) / UC Diagnoses   Final diagnoses:  Cellulitis of neck     Discharge Instructions      Keep the area clean and dry.  Apply mupirocin topical antibiotic twice daily for a week.  Start Keflex  3 times a day for a week.  Monitor for any worsening signs of infection which include but are not limited to fever, worsening redness drainage, chills, or any other concerns that arise.  If these occur please go to the emergency room.  Follow-up with your PCP in 2 to 3 days for recheck.  Hope you feel better soon!    ED Prescriptions     Medication Sig Dispense Auth. Provider   mupirocin ointment (BACTROBAN) 2 % Apply 1 Application topically 2 (two) times daily for 7 days. 22 g Caycee Wanat, Jodi R, NP   cephALEXin  (KEFLEX ) 500 MG capsule Take 1 capsule (500 mg total) by mouth 3 (three) times daily for 7 days. 21 capsule Eulogia Dismore, Jodi R, NP      PDMP not reviewed this encounter.   Alleen Arbour, NP 05/25/23 1805    Alleen Arbour, NP 05/25/23 [phone]

## 2023-05-26 LAB — TSH: TSH: 0.02 u[IU]/mL — ABNORMAL LOW (ref 0.35–5.50)

## 2023-05-27 ENCOUNTER — Other Ambulatory Visit: Payer: Self-pay | Admitting: *Deleted

## 2023-05-27 ENCOUNTER — Other Ambulatory Visit: Payer: Self-pay | Admitting: Internal Medicine

## 2023-05-27 ENCOUNTER — Other Ambulatory Visit (HOSPITAL_COMMUNITY): Payer: Self-pay

## 2023-05-27 ENCOUNTER — Other Ambulatory Visit: Payer: Self-pay

## 2023-05-27 DIAGNOSIS — E039 Hypothyroidism, unspecified: Secondary | ICD-10-CM

## 2023-05-27 MED ORDER — LEVOTHYROXINE SODIUM 50 MCG PO TABS
50.0000 ug | ORAL_TABLET | Freq: Every day | ORAL | 1 refills | Status: DC
  Filled 2023-05-27: qty 90, 90d supply, fill #0
  Filled 2023-08-22: qty 90, 90d supply, fill #1

## 2023-06-15 ENCOUNTER — Other Ambulatory Visit (HOSPITAL_COMMUNITY): Payer: Self-pay

## 2023-06-15 ENCOUNTER — Other Ambulatory Visit: Payer: Self-pay | Admitting: Internal Medicine

## 2023-06-15 ENCOUNTER — Encounter (HOSPITAL_COMMUNITY): Payer: Self-pay

## 2023-06-15 DIAGNOSIS — L709 Acne, unspecified: Secondary | ICD-10-CM

## 2023-07-02 ENCOUNTER — Other Ambulatory Visit: Payer: Self-pay | Admitting: Family

## 2023-07-02 ENCOUNTER — Other Ambulatory Visit (HOSPITAL_COMMUNITY): Payer: Self-pay

## 2023-07-02 ENCOUNTER — Other Ambulatory Visit: Payer: Self-pay | Admitting: Internal Medicine

## 2023-07-05 ENCOUNTER — Other Ambulatory Visit (HOSPITAL_COMMUNITY): Payer: Self-pay

## 2023-07-05 MED ORDER — METFORMIN HCL ER 500 MG PO TB24
500.0000 mg | ORAL_TABLET | Freq: Every day | ORAL | 0 refills | Status: DC
  Filled 2023-07-05: qty 90, 90d supply, fill #0

## 2023-07-09 ENCOUNTER — Other Ambulatory Visit (INDEPENDENT_AMBULATORY_CARE_PROVIDER_SITE_OTHER)

## 2023-07-09 DIAGNOSIS — E039 Hypothyroidism, unspecified: Secondary | ICD-10-CM

## 2023-07-09 LAB — TSH: TSH: 0.41 u[IU]/mL (ref 0.35–5.50)

## 2023-07-19 ENCOUNTER — Ambulatory Visit: Payer: Self-pay | Admitting: Internal Medicine

## 2023-09-07 ENCOUNTER — Other Ambulatory Visit: Payer: Self-pay | Admitting: Nurse Practitioner

## 2023-09-07 ENCOUNTER — Other Ambulatory Visit: Payer: Self-pay

## 2023-09-07 DIAGNOSIS — K5909 Other constipation: Secondary | ICD-10-CM

## 2023-09-09 ENCOUNTER — Other Ambulatory Visit (HOSPITAL_COMMUNITY): Payer: Self-pay

## 2023-09-10 ENCOUNTER — Other Ambulatory Visit (HOSPITAL_COMMUNITY): Payer: Self-pay

## 2023-09-10 ENCOUNTER — Other Ambulatory Visit: Payer: Self-pay | Admitting: Internal Medicine

## 2023-09-10 DIAGNOSIS — K5909 Other constipation: Secondary | ICD-10-CM

## 2023-09-11 ENCOUNTER — Other Ambulatory Visit (HOSPITAL_COMMUNITY): Payer: Self-pay

## 2023-09-13 ENCOUNTER — Other Ambulatory Visit (HOSPITAL_COMMUNITY): Payer: Self-pay

## 2023-09-13 ENCOUNTER — Encounter: Payer: Self-pay | Admitting: Internal Medicine

## 2023-09-13 DIAGNOSIS — K5909 Other constipation: Secondary | ICD-10-CM

## 2023-09-13 MED ORDER — LINACLOTIDE 145 MCG PO CAPS
145.0000 ug | ORAL_CAPSULE | Freq: Every day | ORAL | 3 refills | Status: DC
  Filled 2023-09-13: qty 90, 90d supply, fill #0

## 2023-09-14 ENCOUNTER — Other Ambulatory Visit (HOSPITAL_COMMUNITY): Payer: Self-pay

## 2023-09-14 MED ORDER — FLUOXETINE HCL 20 MG PO CAPS
20.0000 mg | ORAL_CAPSULE | Freq: Two times a day (BID) | ORAL | 1 refills | Status: AC
  Filled 2023-09-14: qty 180, 90d supply, fill #0
  Filled 2023-12-29: qty 180, 90d supply, fill #1

## 2023-09-14 MED ORDER — LINACLOTIDE 145 MCG PO CAPS
145.0000 ug | ORAL_CAPSULE | Freq: Every day | ORAL | 1 refills | Status: AC
  Filled 2023-12-29: qty 90, 90d supply, fill #0

## 2023-09-24 DIAGNOSIS — D3132 Benign neoplasm of left choroid: Secondary | ICD-10-CM | POA: Diagnosis not present

## 2023-09-29 ENCOUNTER — Other Ambulatory Visit: Payer: Self-pay | Admitting: Internal Medicine

## 2023-09-30 ENCOUNTER — Other Ambulatory Visit (HOSPITAL_COMMUNITY): Payer: Self-pay

## 2023-09-30 MED ORDER — METFORMIN HCL ER 500 MG PO TB24
500.0000 mg | ORAL_TABLET | Freq: Every day | ORAL | 0 refills | Status: DC
  Filled 2023-09-30: qty 90, 90d supply, fill #0

## 2023-10-28 ENCOUNTER — Other Ambulatory Visit (HOSPITAL_COMMUNITY): Payer: Self-pay

## 2023-10-28 ENCOUNTER — Other Ambulatory Visit: Payer: Self-pay | Admitting: Internal Medicine

## 2023-10-28 DIAGNOSIS — K317 Polyp of stomach and duodenum: Secondary | ICD-10-CM

## 2023-10-28 DIAGNOSIS — K219 Gastro-esophageal reflux disease without esophagitis: Secondary | ICD-10-CM

## 2023-10-28 DIAGNOSIS — Z79899 Other long term (current) drug therapy: Secondary | ICD-10-CM

## 2023-10-28 MED ORDER — OMEPRAZOLE 40 MG PO CPDR
40.0000 mg | DELAYED_RELEASE_CAPSULE | Freq: Every day | ORAL | 1 refills | Status: AC
  Filled 2023-10-28: qty 90, 90d supply, fill #0
  Filled 2024-01-23: qty 90, 90d supply, fill #1

## 2023-10-29 ENCOUNTER — Other Ambulatory Visit (HOSPITAL_COMMUNITY): Payer: Self-pay

## 2023-11-12 ENCOUNTER — Other Ambulatory Visit: Payer: Self-pay | Admitting: Nurse Practitioner

## 2023-11-12 ENCOUNTER — Other Ambulatory Visit (HOSPITAL_COMMUNITY): Payer: Self-pay

## 2023-11-12 ENCOUNTER — Other Ambulatory Visit: Payer: Self-pay | Admitting: Internal Medicine

## 2023-11-12 DIAGNOSIS — E782 Mixed hyperlipidemia: Secondary | ICD-10-CM

## 2023-11-16 ENCOUNTER — Telehealth: Payer: Self-pay | Admitting: Internal Medicine

## 2023-11-16 ENCOUNTER — Other Ambulatory Visit (HOSPITAL_COMMUNITY): Payer: Self-pay

## 2023-11-16 NOTE — Telephone Encounter (Signed)
 Pt called in today wondering why her refill request was denied. She explained that this script came from Dr Tonita before he passed and Bascom Cleveland signed off last time but is no longer her provider. She has an upcoming appt on 11/04 and plans on requesting to have Dr Theophilus take over this script and her doxycycline  as well so she doesn't have any more refill issues.

## 2023-11-16 NOTE — Telephone Encounter (Signed)
 Copied from CRM [id number]. Topic: Clinical - Medication Refill >> Nov 16, 2023  4:41 PM Sasha M wrote: Medication: Bempedoic Acid -Ezetimibe  180-10mg  90 day supply  Has the patient contacted their pharmacy? No (Agent: If no, request that the patient contact the pharmacy for the refill. If patient does not wish to contact the pharmacy document the reason why and proceed with request.) (Agent: If yes, when and what did the pharmacy advise?)  This is the patient's preferred pharmacy:  [facility] [address] [place] [address] Phone: [phone] Fax: [phone]   Is this the correct pharmacy for this prescription? Yes If no, delete pharmacy and type the correct one.   Has the prescription been filled recently? No  Is the patient out of the medication? No  Has the patient been seen for an appointment in the last year OR does the patient have an upcoming appointment? Yes, upcoming on 11/04. Subscriber is Paediatric nurse under Dr Tonita  Can we respond through MyChart? Yes  Agent: Please be advised that Rx refills may take up to 3 business days. We ask that you follow-up with your pharmacy.

## 2023-11-17 ENCOUNTER — Telehealth (HOSPITAL_COMMUNITY): Payer: Self-pay

## 2023-11-17 ENCOUNTER — Other Ambulatory Visit (HOSPITAL_COMMUNITY): Payer: Self-pay

## 2023-11-17 MED ORDER — BEMPEDOIC ACID-EZETIMIBE 180-10 MG PO TABS
ORAL_TABLET | ORAL | 0 refills | Status: DC
  Filled 2023-11-17: qty 90, 90d supply, fill #0

## 2023-11-17 NOTE — Telephone Encounter (Signed)
 Refill sent.

## 2023-11-17 NOTE — Addendum Note (Signed)
 Addended by: KATHRYNE MILLMAN B on: 11/17/2023 10:37 AM   Modules accepted: Orders

## 2023-11-17 NOTE — Telephone Encounter (Signed)
 Pharmacy Patient Advocate Encounter   Received notification from Patient Pharmacy that prior authorization for Nexlizet  180-10MG  tablets  is required/requested.   Insurance verification completed.   The patient is insured through [facility].   Per test claim: PA required; PA submitted to above mentioned insurance via Latent Key/confirmation #/EOC [facility] Status is pending

## 2023-11-18 ENCOUNTER — Other Ambulatory Visit (HOSPITAL_COMMUNITY): Payer: Self-pay

## 2023-11-18 NOTE — Telephone Encounter (Signed)
 Pharmacy Patient Advocate Encounter  Received notification from [facility] that Prior Authorization for Nexlizet  180-10MG  tablets  has been APPROVED from 11/17/23 to 11/15/24. Ran test claim, Copay is $25. This test claim was processed through [facility]- copay amounts may vary at other pharmacies due to pharmacy/plan contracts, or as the patient moves through the different stages of their insurance plan.   PA #/Case ID/Reference #: [id number]

## 2023-11-23 ENCOUNTER — Encounter: Payer: Commercial Managed Care - PPO | Admitting: Nurse Practitioner

## 2023-11-25 ENCOUNTER — Other Ambulatory Visit (HOSPITAL_COMMUNITY): Payer: Self-pay

## 2023-11-30 ENCOUNTER — Ambulatory Visit (INDEPENDENT_AMBULATORY_CARE_PROVIDER_SITE_OTHER): Admitting: Internal Medicine

## 2023-11-30 ENCOUNTER — Other Ambulatory Visit (HOSPITAL_COMMUNITY): Payer: Self-pay

## 2023-11-30 ENCOUNTER — Other Ambulatory Visit: Payer: Self-pay

## 2023-11-30 VITALS — BP 102/72 | HR 70 | Temp 98.3°F | Ht 61.0 in | Wt 152.7 lb

## 2023-11-30 DIAGNOSIS — M545 Low back pain, unspecified: Secondary | ICD-10-CM

## 2023-11-30 DIAGNOSIS — E559 Vitamin D deficiency, unspecified: Secondary | ICD-10-CM

## 2023-11-30 DIAGNOSIS — E782 Mixed hyperlipidemia: Secondary | ICD-10-CM | POA: Diagnosis not present

## 2023-11-30 DIAGNOSIS — K219 Gastro-esophageal reflux disease without esophagitis: Secondary | ICD-10-CM

## 2023-11-30 DIAGNOSIS — E079 Disorder of thyroid, unspecified: Secondary | ICD-10-CM

## 2023-11-30 DIAGNOSIS — Z87898 Personal history of other specified conditions: Secondary | ICD-10-CM

## 2023-11-30 DIAGNOSIS — G8929 Other chronic pain: Secondary | ICD-10-CM | POA: Diagnosis not present

## 2023-11-30 DIAGNOSIS — L709 Acne, unspecified: Secondary | ICD-10-CM

## 2023-11-30 DIAGNOSIS — E282 Polycystic ovarian syndrome: Secondary | ICD-10-CM

## 2023-11-30 DIAGNOSIS — E039 Hypothyroidism, unspecified: Secondary | ICD-10-CM | POA: Diagnosis not present

## 2023-11-30 DIAGNOSIS — Z0000 Encounter for general adult medical examination without abnormal findings: Secondary | ICD-10-CM

## 2023-11-30 LAB — CBC WITH DIFFERENTIAL/PLATELET
Basophils Absolute: 0 K/uL (ref 0.0–0.1)
Basophils Relative: 0.7 % (ref 0.0–3.0)
Eosinophils Absolute: 0.1 K/uL (ref 0.0–0.7)
Eosinophils Relative: 2.7 % (ref 0.0–5.0)
HCT: 38.7 % (ref 36.0–46.0)
Hemoglobin: 13.1 g/dL (ref 12.0–15.0)
Lymphocytes Relative: 31.1 % (ref 12.0–46.0)
Lymphs Abs: 1 K/uL (ref 0.7–4.0)
MCHC: 33.8 g/dL (ref 30.0–36.0)
MCV: 94.4 fl (ref 78.0–100.0)
Monocytes Absolute: 0.4 K/uL (ref 0.1–1.0)
Monocytes Relative: 12.3 % — ABNORMAL HIGH (ref 3.0–12.0)
Neutro Abs: 1.7 K/uL (ref 1.4–7.7)
Neutrophils Relative %: 53.2 % (ref 43.0–77.0)
Platelets: 375 K/uL (ref 150.0–400.0)
RBC: 4.1 Mil/uL (ref 3.87–5.11)
RDW: 13.3 % (ref 11.5–15.5)
WBC: 3.2 K/uL — ABNORMAL LOW (ref 4.0–10.5)

## 2023-11-30 LAB — COMPREHENSIVE METABOLIC PANEL WITH GFR
ALT: 20 U/L (ref 0–35)
AST: 27 U/L (ref 0–37)
Albumin: 4.6 g/dL (ref 3.5–5.2)
Alkaline Phosphatase: 50 U/L (ref 39–117)
BUN: 11 mg/dL (ref 6–23)
CO2: 29 meq/L (ref 19–32)
Calcium: 9.5 mg/dL (ref 8.4–10.5)
Chloride: 98 meq/L (ref 96–112)
Creatinine, Ser: 0.8 mg/dL (ref 0.40–1.20)
GFR: 78.22 mL/min (ref >60.00)
Glucose, Bld: 78 mg/dL (ref 70–99)
Potassium: 4.3 meq/L (ref 3.5–5.1)
Sodium: 135 meq/L (ref 135–145)
Total Bilirubin: 0.4 mg/dL (ref 0.2–1.2)
Total Protein: 7.1 g/dL (ref 6.0–8.3)

## 2023-11-30 LAB — LIPID PANEL
Cholesterol: 181 mg/dL (ref 0–200)
HDL: 45.5 mg/dL (ref >39.00)
LDL Cholesterol: 87 mg/dL (ref 0–99)
NonHDL: 135.51
Total CHOL/HDL Ratio: 4
Triglycerides: 243 mg/dL — ABNORMAL HIGH (ref 0.0–149.0)
VLDL: 48.6 mg/dL — ABNORMAL HIGH (ref 0.0–40.0)

## 2023-11-30 LAB — HEMOGLOBIN A1C: Hgb A1c MFr Bld: 6 % (ref 4.6–6.5)

## 2023-11-30 MED ORDER — DOXYCYCLINE HYCLATE 100 MG PO CAPS
100.0000 mg | ORAL_CAPSULE | Freq: Every day | ORAL | 0 refills | Status: AC
  Filled 2023-11-30: qty 90, 90d supply, fill #0

## 2023-11-30 NOTE — Progress Notes (Signed)
 Established Patient Office Visit     CC/Reason for Visit: Annual preventive exam  HPI: Alison Alvarez is a 63 y.o. female who is coming in today for the above mentioned reasons. Past Medical History is significant for: GERD, hypothyroidism, impaired glucose tolerance, cystic acne, generalized anxiety disorder, hyperlipidemia, vitamin D  deficiency, seasonal allergies.  She is wanting a refill of her doxycycline  that she uses for her acne.  Is also requesting prescriptions for massage therapy and facial electrolyte cysts that helps her get insurance reimbursement for these procedures.  She will call to schedule her mammogram.  She is due for COVID, pneumonia, shingles, Tdap vaccines.  Has routine eye and dental care.   Past Medical/Surgical History: Past Medical History:  Diagnosis Date   Acne    Anxiety    Arthritis    Cervical polyp    Chronic back pain    Depression    Endometriosis    GERD (gastroesophageal reflux disease)    History of colon polyps 2012   Hyperlipidemia    Hypothyroidism, adult    hypothyroidism   PCO (polycystic ovaries)    Plantar fasciitis    Pleural effusion, bilateral    Post-menopausal    Pre-diabetes     Past Surgical History:  Procedure Laterality Date   BRAIN MENINGIOMA EXCISION  1965   at age 75   BREAST EXCISIONAL BIOPSY Left    BREAST LUMPECTOMY Left    lumpectomy 1990's   DIAGNOSTIC LAPAROSCOPY     SYNOVIAL CYST EXCISION  2010   l5, s1 left hip   TONSILLECTOMY     wisom teeth  1984    Social History:  reports that she has never smoked. She has never used smokeless tobacco. She reports current alcohol use. She reports that she does not use drugs.  Allergies: Allergies  Allergen Reactions   Advicor [Niacin-Lovastatin Er] Swelling    Facial swelling   Niacin-Lovastatin Er Swelling and Other (See Comments)    Extreme flushing   Rosuvastatin  Other (See Comments)    Myalgias and brain fog even at low dose    Family  History:  Family History  Problem Relation Age of Onset   Dementia Mother    Osteoporosis Mother    Varicose Veins Mother    Heart disease Father    Parkinson's disease Father    Stroke Father    Diabetes Father    Hyperlipidemia Father    Hypertension Father    Endometrial cancer Sister    Hypertension Brother    Hyperlipidemia Brother    Cancer Maternal Grandfather        brain   Cancer Paternal Aunt        breast   Breast cancer Paternal Aunt    Diabetes Paternal Aunt    Colon cancer Neg Hx    Esophageal cancer Neg Hx      Current Outpatient Medications:    acetaminophen  (TYLENOL ) 500 MG tablet, Take 1 tablet (500 mg total) by mouth every 6 (six) hours as needed for mild pain, fever or headache., Disp: 30 tablet, Rfl:    ALPRAZolam  (XANAX ) 0.25 MG tablet, Take 1 tablet (0.25 mg total) by mouth 2 (two) times daily as needed for anxiety., Disp: 60 tablet, Rfl: 0   Bempedoic Acid -Ezetimibe  180-10 MG TABS, Take one tablet daily, Disp: 90 tablet, Rfl: 0   Bioflavonoid Products (PERIDRIN-C) 200-50-150 MG TABS, Take 2 tablets by mouth daily. , Disp: , Rfl:    cetirizine (ZYRTEC)  10 MG tablet, Take 10 mg by mouth daily., Disp: , Rfl:    FLUoxetine  (PROZAC ) 20 MG capsule, TAKE 1 CAPSULE BY MOUTH 2 TIMES DAILY FOR MOOD, Disp: 180 capsule, Rfl: 1   HYDROcodone -acetaminophen  (NORCO/VICODIN) 5-325 MG tablet, Take 1 tablet by mouth daily as needed for moderate pain (pain score 4-6)., Disp: 10 tablet, Rfl: 0   levothyroxine  (SYNTHROID ) 50 MCG tablet, Take 1 tablet (50 mcg total) by mouth daily., Disp: 90 tablet, Rfl: 1   linaclotide  (LINZESS ) 145 MCG CAPS capsule, Take 1 capsule (145 mcg total) by mouth daily for constipation., Disp: 90 capsule, Rfl: 1   metFORMIN  (GLUCOPHAGE -XR) 500 MG 24 hr tablet, Take 1 tablet by mouth daily for Insulin  Resistance & Weight., Disp: 90 tablet, Rfl: 0   mometasone  (NASONEX ) 50 MCG/ACT nasal spray, Place 2 sprays into the nose daily., Disp: , Rfl:     omeprazole  (PRILOSEC) 40 MG capsule, Take 1 capsule (40 mg total) by mouth daily. Best to take on an empty stomach 20 - 30 minutes before food., Disp: 90 capsule, Rfl: 1   Vitamin D , Ergocalciferol , (DRISDOL ) 1.25 MG (50000 UNIT) CAPS capsule, Take 1 capsule (50,000 Units total) by mouth every 7 (seven) days., Disp: 12 capsule, Rfl: 3   doxycycline  (VIBRAMYCIN ) 100 MG capsule, Take 1 capsule (100 mg total) by mouth daily for acne with food, Disp: 90 capsule, Rfl: 0  Review of Systems:  Negative unless indicated in HPI.   Physical Exam: Vitals:   11/30/23 1026  BP: 102/72  Pulse: 70  Temp: 98.3 F (36.8 C)  TempSrc: Oral  SpO2: 97%  Weight: 152 lb 11.2 oz (69.3 kg)  Height: 5' 1 (1.549 m)    Body mass index is 28.85 kg/m.   Physical Exam Vitals reviewed.  Constitutional:      General: She is not in acute distress.    Appearance: Normal appearance. She is not ill-appearing, toxic-appearing or diaphoretic.  HENT:     Head: Normocephalic.     Right Ear: Tympanic membrane, ear canal and external ear normal. There is no impacted cerumen.     Left Ear: Tympanic membrane, ear canal and external ear normal. There is no impacted cerumen.     Nose: Nose normal.     Mouth/Throat:     Mouth: Mucous membranes are moist.     Pharynx: Oropharynx is clear. No oropharyngeal exudate or posterior oropharyngeal erythema.  Eyes:     General: No scleral icterus.       Right eye: No discharge.        Left eye: No discharge.     Conjunctiva/sclera: Conjunctivae normal.     Pupils: Pupils are equal, round, and reactive to light.  Neck:     Vascular: No carotid bruit.  Cardiovascular:     Rate and Rhythm: Normal rate and regular rhythm.     Pulses: Normal pulses.     Heart sounds: Normal heart sounds.  Pulmonary:     Effort: Pulmonary effort is normal. No respiratory distress.     Breath sounds: Normal breath sounds.  Abdominal:     General: Abdomen is flat. Bowel sounds are normal.      Palpations: Abdomen is soft.  Musculoskeletal:        General: Normal range of motion.     Cervical back: Normal range of motion.  Skin:    General: Skin is warm and dry.  Neurological:     General: No focal deficit present.  Mental Status: She is alert and oriented to person, place, and time. Mental status is at baseline.  Psychiatric:        Mood and Affect: Mood normal.        Behavior: Behavior normal.        Thought Content: Thought content normal.        Judgment: Judgment normal.      Impression and Plan:  Encounter for preventive health examination  Hypothyroidism, unspecified type  Thyroid  disease -     TSH; Future  Mixed hyperlipidemia -     CBC with Differential/Platelet; Future -     Lipid panel; Future -     Comprehensive metabolic panel with GFR; Future  History of prediabetes -     Hemoglobin A1c; Future  Vitamin D  deficiency -     VITAMIN D  25 Hydroxy (Vit-D Deficiency, Fractures); Future  Gastroesophageal reflux disease, unspecified whether esophagitis present  Low back pain, unspecified back pain laterality, unspecified chronicity, unspecified whether sciatica present -     For home use only DME Other see comment  Other chronic pain -     For home use only DME Other see comment  Polycystic ovaries -     Vitamin B12; Future -     For home use only DME Other see comment  Acne, unspecified acne type -     Doxycycline  Hyclate; Take 1 capsule (100 mg total) by mouth daily for acne with food  Dispense: 90 capsule; Refill: 0   -Recommend routine eye and dental care. -Healthy lifestyle discussed in detail. -Labs to be updated today. -Prostate cancer screening: Not applicable Health Maintenance  Topic Date Due   COVID-19 Vaccine (4 - 2025-26 season) 12/16/2023*   Zoster (Shingles) Vaccine (1 of 2) 03/01/2024*   DTaP/Tdap/Td vaccine (3 - Td or Tdap) 11/29/2024*   Pneumococcal Vaccine for age over 57 (1 of 2 - PCV) 11/29/2024*   Breast Cancer  Screening  10/29/2024   Pap with HPV screening  08/18/2027   Colon Cancer Screening  10/16/2027   Flu Shot  Completed   Hepatitis C Screening  Completed   HIV Screening  Completed   Hepatitis B Vaccine  Aged Out   HPV Vaccine  Aged Out   Meningitis B Vaccine  Aged Out  *Topic was postponed. The date shown is not the original due date.    - Declines vaccines today despite counseling. - She will call to schedule mammogram.    Tully Theophilus Andrews, MD [facility]

## 2023-12-01 LAB — TSH: TSH: 1.2 u[IU]/mL (ref 0.35–5.50)

## 2023-12-01 LAB — VITAMIN B12: Vitamin B-12: 324 pg/mL (ref 211–911)

## 2023-12-01 LAB — VITAMIN D 25 HYDROXY (VIT D DEFICIENCY, FRACTURES): VITD: 59.67 ng/mL (ref 30.00–100.00)

## 2023-12-02 ENCOUNTER — Other Ambulatory Visit: Payer: Self-pay | Admitting: Internal Medicine

## 2023-12-02 DIAGNOSIS — Z1231 Encounter for screening mammogram for malignant neoplasm of breast: Secondary | ICD-10-CM

## 2023-12-05 ENCOUNTER — Other Ambulatory Visit: Payer: Self-pay | Admitting: Internal Medicine

## 2023-12-05 DIAGNOSIS — E039 Hypothyroidism, unspecified: Secondary | ICD-10-CM

## 2023-12-06 ENCOUNTER — Ambulatory Visit: Payer: Self-pay | Admitting: Internal Medicine

## 2023-12-06 ENCOUNTER — Other Ambulatory Visit: Payer: Self-pay

## 2023-12-06 ENCOUNTER — Other Ambulatory Visit (HOSPITAL_COMMUNITY): Payer: Self-pay

## 2023-12-06 MED ORDER — LEVOTHYROXINE SODIUM 50 MCG PO TABS
50.0000 ug | ORAL_TABLET | Freq: Every day | ORAL | 1 refills | Status: AC
  Filled 2023-12-06: qty 90, 90d supply, fill #0
  Filled 2024-03-03: qty 90, 90d supply, fill #1

## 2023-12-07 ENCOUNTER — Ambulatory Visit
Admission: RE | Admit: 2023-12-07 | Discharge: 2023-12-07 | Disposition: A | Discharge status: Home / Self Care | Source: Ambulatory Visit | Attending: Internal Medicine | Admitting: Internal Medicine

## 2023-12-07 DIAGNOSIS — Z1231 Encounter for screening mammogram for malignant neoplasm of breast: Secondary | ICD-10-CM

## 2023-12-10 ENCOUNTER — Other Ambulatory Visit: Payer: Self-pay | Admitting: Internal Medicine

## 2023-12-10 DIAGNOSIS — R928 Other abnormal and inconclusive findings on diagnostic imaging of breast: Secondary | ICD-10-CM

## 2023-12-14 ENCOUNTER — Ambulatory Visit
Admission: RE | Admit: 2023-12-14 | Discharge: 2023-12-14 | Disposition: A | Discharge status: Home / Self Care | Source: Ambulatory Visit | Attending: Internal Medicine | Admitting: Internal Medicine

## 2023-12-14 ENCOUNTER — Other Ambulatory Visit: Payer: Self-pay | Admitting: Internal Medicine

## 2023-12-14 DIAGNOSIS — R928 Other abnormal and inconclusive findings on diagnostic imaging of breast: Secondary | ICD-10-CM | POA: Diagnosis not present

## 2023-12-14 DIAGNOSIS — N6320 Unspecified lump in the left breast, unspecified quadrant: Secondary | ICD-10-CM | POA: Diagnosis not present

## 2023-12-17 ENCOUNTER — Ambulatory Visit
Admission: RE | Admit: 2023-12-17 | Discharge: 2023-12-17 | Disposition: A | Discharge status: Home / Self Care | Source: Ambulatory Visit | Attending: Internal Medicine | Admitting: Internal Medicine

## 2023-12-17 DIAGNOSIS — N6325 Unspecified lump in the left breast, overlapping quadrants: Secondary | ICD-10-CM | POA: Diagnosis not present

## 2023-12-17 DIAGNOSIS — N6320 Unspecified lump in the left breast, unspecified quadrant: Secondary | ICD-10-CM

## 2023-12-17 DIAGNOSIS — R928 Other abnormal and inconclusive findings on diagnostic imaging of breast: Secondary | ICD-10-CM

## 2023-12-17 DIAGNOSIS — N6012 Diffuse cystic mastopathy of left breast: Secondary | ICD-10-CM | POA: Diagnosis not present

## 2023-12-17 HISTORY — PX: BREAST BIOPSY: SHX20

## 2023-12-20 LAB — SURGICAL PATHOLOGY

## 2023-12-29 ENCOUNTER — Other Ambulatory Visit: Payer: Self-pay | Admitting: Internal Medicine

## 2023-12-30 ENCOUNTER — Other Ambulatory Visit (HOSPITAL_COMMUNITY): Payer: Self-pay

## 2023-12-30 MED ORDER — METFORMIN HCL ER 500 MG PO TB24
500.0000 mg | ORAL_TABLET | Freq: Every day | ORAL | 1 refills | Status: AC
  Filled 2023-12-30: qty 90, 90d supply, fill #0

## 2024-02-15 ENCOUNTER — Other Ambulatory Visit: Payer: Self-pay | Admitting: Internal Medicine

## 2024-02-15 ENCOUNTER — Other Ambulatory Visit (HOSPITAL_COMMUNITY): Payer: Self-pay

## 2024-02-15 MED ORDER — NEXLIZET 180-10 MG PO TABS
ORAL_TABLET | ORAL | 0 refills | Status: AC
  Filled 2024-02-15: qty 90, 90d supply, fill #0
